# Patient Record
Sex: Female | Born: 1980 | Race: Black or African American | Hispanic: No | Smoking: Former smoker
Health system: Southern US, Community
[De-identification: ages and names within clinical notes are randomized; demographics above are authoritative.]

## PROBLEM LIST (undated history)

## (undated) DIAGNOSIS — M1990 Unspecified osteoarthritis, unspecified site: Secondary | ICD-10-CM

## (undated) DIAGNOSIS — N946 Dysmenorrhea, unspecified: Secondary | ICD-10-CM

## (undated) DIAGNOSIS — D259 Leiomyoma of uterus, unspecified: Secondary | ICD-10-CM

## (undated) DIAGNOSIS — F329 Major depressive disorder, single episode, unspecified: Secondary | ICD-10-CM

## (undated) DIAGNOSIS — R102 Pelvic and perineal pain: Secondary | ICD-10-CM

## (undated) DIAGNOSIS — F32A Depression, unspecified: Secondary | ICD-10-CM

## (undated) DIAGNOSIS — G8929 Other chronic pain: Secondary | ICD-10-CM

## (undated) DIAGNOSIS — N920 Excessive and frequent menstruation with regular cycle: Secondary | ICD-10-CM

## (undated) DIAGNOSIS — F209 Schizophrenia, unspecified: Secondary | ICD-10-CM

## (undated) DIAGNOSIS — I10 Essential (primary) hypertension: Secondary | ICD-10-CM

## (undated) DIAGNOSIS — F419 Anxiety disorder, unspecified: Secondary | ICD-10-CM

## (undated) DIAGNOSIS — D649 Anemia, unspecified: Secondary | ICD-10-CM

## (undated) HISTORY — PX: CHOLECYSTECTOMY: SHX55

## (undated) HISTORY — PX: TONSILLECTOMY: SUR1361

## (undated) HISTORY — PX: TUBAL LIGATION: SHX77

## (undated) HISTORY — PX: ADENOIDECTOMY: SUR15

---

## 2006-02-23 ENCOUNTER — Emergency Department (HOSPITAL_COMMUNITY): Admission: EM | Admit: 2006-02-23 | Discharge: 2006-02-23 | Payer: Self-pay | Admitting: Emergency Medicine

## 2012-06-21 ENCOUNTER — Emergency Department (HOSPITAL_COMMUNITY)
Admission: EM | Admit: 2012-06-21 | Discharge: 2012-06-21 | Disposition: A | Discharge status: Home / Self Care | Payer: Medicaid Other | Attending: Emergency Medicine | Admitting: Emergency Medicine

## 2012-06-21 ENCOUNTER — Encounter (HOSPITAL_COMMUNITY): Payer: Self-pay | Admitting: *Deleted

## 2012-06-21 DIAGNOSIS — F17200 Nicotine dependence, unspecified, uncomplicated: Secondary | ICD-10-CM | POA: Insufficient documentation

## 2012-06-21 DIAGNOSIS — N390 Urinary tract infection, site not specified: Secondary | ICD-10-CM

## 2012-06-21 DIAGNOSIS — R109 Unspecified abdominal pain: Secondary | ICD-10-CM | POA: Insufficient documentation

## 2012-06-21 HISTORY — DX: Major depressive disorder, single episode, unspecified: F32.9

## 2012-06-21 HISTORY — DX: Depression, unspecified: F32.A

## 2012-06-21 LAB — URINALYSIS, ROUTINE W REFLEX MICROSCOPIC
Glucose, UA: NEGATIVE mg/dL
Ketones, ur: NEGATIVE mg/dL
Specific Gravity, Urine: 1.02 (ref 1.005–1.030)
pH: 7 (ref 5.0–8.0)

## 2012-06-21 LAB — URINE MICROSCOPIC-ADD ON

## 2012-06-21 MED ORDER — CIPROFLOXACIN HCL 500 MG PO TABS: 500.0000 mg | ORAL_TABLET | Freq: Two times a day (BID) | ORAL | Status: AC

## 2012-06-21 MED ORDER — PHENAZOPYRIDINE HCL 200 MG PO TABS: 200.0000 mg | ORAL_TABLET | Freq: Three times a day (TID) | ORAL | Status: AC | PRN

## 2012-06-21 MED ORDER — PHENAZOPYRIDINE HCL 100 MG PO TABS
200.0000 mg | ORAL_TABLET | Freq: Once | ORAL | Status: AC
Start: 2012-06-21 — End: 2012-06-21
  Administered 2012-06-21: 200 mg via ORAL
  Filled 2012-06-21: qty 2

## 2012-06-21 NOTE — ED Provider Notes (Cosign Needed)
History  This chart was scribed for Rachel Lennert, MD by Rachel Ray. This patient was seen in room APA16A/[facility]16A and the patient's care was started at 3:32PM.  CSN: [id number]  Arrival date & time 06/21/12  1515   First MD Initiated Contact with Patient 06/21/12 1532      Chief Complaint  Patient presents with  . Urinary Frequency    Patient is a 31 y.o. female presenting with abdominal pain. The history is provided by the patient. No language interpreter was used.  Abdominal Pain The primary symptoms of the illness include abdominal pain and nausea. The primary symptoms of the illness do not include fatigue, vomiting, diarrhea, dysuria or vaginal bleeding. The current episode started 2 days ago. The onset of the illness was gradual. The problem has been gradually worsening.  The abdominal pain is located in the suprapubic region. The abdominal pain radiates to the back. The abdominal pain is relieved by nothing.  Additional symptoms associated with the illness include urgency, hematuria, frequency and back pain. Significant associated medical issues do not include inflammatory bowel disease, diabetes or diverticulitis.    Rachel Ray is a 31 y.o. female who presents to the Emergency Department complaining of 2 days of gradual onset, gradually worsening, constant suprapubic abdominal pain described as pressure that radiates to the lower back with associated nausea, hematuria described tea colored, urgency and frequency. The pain is worse after urination and she reports using Aleve with mild improvement. She reports prior episodes of similar bladder infection, last one occuring over one year ago. She also c/o back pain described as a cramping sensation that radiates down to left foot. She denies fever, sore throat, visual disturbance, CP, cough, emesis, diarrhea, HA, weakness, numbness and rash as associated symptoms. She has a h/o depression. She is a current everyday smoker and  occasional alcohol user.  PCP is Rachel Ray.  Past Medical History  Diagnosis Date  . Depression     Past Surgical History  Procedure Date  . Cesarean section     x 3  . Cholecystectomy   . Tonsillectomy   . Adenoidectomy   . Tubal ligation     History reviewed. No pertinent family history.  History  Substance Use Topics  . Smoking status: Current Everyday Smoker -- 0.5 packs/day    Types: Cigarettes  . Smokeless tobacco: Not on file  . Alcohol Use: Yes    NO OB history provided.  Review of Systems  Constitutional: Negative for fatigue.  HENT: Negative for congestion, sinus pressure and ear discharge.   Eyes: Negative for discharge.  Respiratory: Negative for cough.   Cardiovascular: Negative for chest pain.  Gastrointestinal: Positive for nausea and abdominal pain. Negative for vomiting and diarrhea.  Genitourinary: Positive for urgency, frequency and hematuria. Negative for dysuria and vaginal bleeding.  Musculoskeletal: Positive for back pain.  Skin: Negative for rash.  Neurological: Negative for seizures and headaches.  Hematological: Negative.   Psychiatric/Behavioral: Negative for hallucinations.    Allergies  Codeine and Penicillins  Home Medications  No current outpatient prescriptions on file.  Triage Vitals: BP 117/80  Pulse 89  Temp 98.3 F (36.8 C) (Oral)  Resp 16  Ht 5\' 2"  (1.575 m)  Wt 220 lb (99.791 kg)  BMI 40.24 kg/m2  SpO2 100%  LMP 06/03/2012  Physical Exam  Nursing note and vitals reviewed. Constitutional: She is oriented to person, place, and time. She appears well-developed and well-nourished.  HENT:  Head: Normocephalic and atraumatic.  Eyes: Conjunctivae and EOM are normal. No scleral icterus.  Neck: Neck supple. No thyromegaly present.  Cardiovascular: Normal rate and regular rhythm.  Exam reveals no gallop and no friction rub.   No murmur heard. Pulmonary/Chest: Effort normal and breath sounds normal. No stridor. She  has no wheezes. She has no rales. She exhibits no tenderness.  Abdominal: Soft. She exhibits no distension. There is tenderness (mild suprapubic tenderness). There is no rebound.  Musculoskeletal: Normal range of motion. She exhibits no edema.       Mild lumbar tenderness  Lymphadenopathy:    She has no cervical adenopathy.  Neurological: She is alert and oriented to person, place, and time. Coordination normal.  Skin: No rash noted. No erythema.  Psychiatric: She has a normal mood and affect. Her behavior is normal.    ED Course  Procedures (including critical care time)  DIAGNOSTIC STUDIES: Oxygen Saturation is 100% on room air, normal by my interpretation.    COORDINATION OF CARE: 3:30PM-Discussed treatment plan which includes urinalysis with pt at bedside and pt agreed to plan.    Labs Reviewed  URINALYSIS, ROUTINE W REFLEX MICROSCOPIC   No results found.   No diagnosis found.    MDM        The chart was scribed for me under my direct supervision.  I personally performed the history, physical, and medical decision making and all procedures in the evaluation of this patient.Rachel Lennert, MD 06/21/12 [phone]

## 2012-06-21 NOTE — ED Notes (Signed)
Pt c/o urinary frequency, urgency, lower back pain radiating to her left leg and blood in her urine for 2 days. Pt states that it is worse today. C/o nausea also.

## 2012-07-28 ENCOUNTER — Encounter (HOSPITAL_COMMUNITY): Payer: Self-pay | Admitting: *Deleted

## 2012-07-28 ENCOUNTER — Emergency Department (HOSPITAL_COMMUNITY)
Admission: EM | Admit: 2012-07-28 | Discharge: 2012-07-28 | Disposition: A | Discharge status: Home / Self Care | Payer: Medicaid Other | Attending: Emergency Medicine | Admitting: Emergency Medicine

## 2012-07-28 DIAGNOSIS — F419 Anxiety disorder, unspecified: Secondary | ICD-10-CM

## 2012-07-28 DIAGNOSIS — Z760 Encounter for issue of repeat prescription: Secondary | ICD-10-CM | POA: Insufficient documentation

## 2012-07-28 DIAGNOSIS — F17200 Nicotine dependence, unspecified, uncomplicated: Secondary | ICD-10-CM | POA: Insufficient documentation

## 2012-07-28 DIAGNOSIS — I10 Essential (primary) hypertension: Secondary | ICD-10-CM | POA: Insufficient documentation

## 2012-07-28 DIAGNOSIS — F411 Generalized anxiety disorder: Secondary | ICD-10-CM | POA: Insufficient documentation

## 2012-07-28 DIAGNOSIS — F329 Major depressive disorder, single episode, unspecified: Secondary | ICD-10-CM | POA: Insufficient documentation

## 2012-07-28 DIAGNOSIS — F3289 Other specified depressive episodes: Secondary | ICD-10-CM | POA: Insufficient documentation

## 2012-07-28 HISTORY — DX: Essential (primary) hypertension: I10

## 2012-07-28 HISTORY — DX: Anxiety disorder, unspecified: F41.9

## 2012-07-28 MED ORDER — ALPRAZOLAM 0.5 MG PO TABS
0.5000 mg | ORAL_TABLET | Freq: Once | ORAL | Status: AC
  Administered 2012-07-28: 0.5 mg via ORAL
  Filled 2012-07-28: qty 1

## 2012-07-28 MED ORDER — QUETIAPINE FUMARATE 400 MG PO TABS: 400.0000 mg | ORAL_TABLET | Freq: Every day | ORAL | Status: DC

## 2012-07-28 MED ORDER — TRAZODONE HCL 100 MG PO TABS: 100.0000 mg | ORAL_TABLET | Freq: Every day | ORAL | Status: DC

## 2012-07-28 MED ORDER — ALPRAZOLAM 0.5 MG PO TABS: 0.5000 mg | ORAL_TABLET | Freq: Two times a day (BID) | ORAL | Status: AC

## 2012-07-28 NOTE — ED Notes (Signed)
Pt is calm at this time 

## 2012-07-28 NOTE — ED Notes (Signed)
Hx of panic attacks - missed appt yesterday and has been out of anxiety meds x 1 wk.  Reports increased anxiety since.  States was lying across bed this morning and felt heart beating fast.  Pt calm at this time.

## 2012-07-28 NOTE — ED Notes (Signed)
Pt states she had a panic attack this morning after "getting into it with my daughter". Pt states her daughter did not want to go to school. States she missed her MD appt yesterday and has been out of all of her medications x 1 week.

## 2012-07-28 NOTE — ED Provider Notes (Signed)
History     CSN: [id number]  Arrival date & time 07/28/12  4098   First MD Initiated Contact with Patient 07/28/12 [phone]      Chief Complaint  Patient presents with  . Panic Attack    (Consider location/radiation/quality/duration/timing/severity/associated sxs/prior treatment) HPI Comments: Rachel Ray presents for assistance with medication refill.  She ran out of several of her psychiatric medications within the last 4-5 days.  She missed her appointment with Dr. Betti Cruz at day [facility] [place] today and this was having to be rescheduled.  In the interim she has noticed an escalation in her anxiety and reports having a panic attack while at home last evening which included shortness of breath palpitations and peripheral numbness in her fingers and toes which lasted for several minutes.  She is without symptoms at this time but is desirous of refills on her Xanax, and her Seroquel and her trazodone.  She reports she has her Cymbalta and has been taking this daily.  She denies any other symptoms at this time including suicidal or homicidal ideation.  She presents with a family member today and feels comfortable going home and is without other complaints.  She is awaiting a callback from Dr. Jae Dire office for an appointment time for evaluation and refills.   Past Medical History  Diagnosis Date  . Depression   . Anxiety   . Hypertension     Past Surgical History  Procedure Date  . Cesarean section     x 3  . Cholecystectomy   . Tonsillectomy   . Adenoidectomy   . Tubal ligation     No family history on file.  History  Substance Use Topics  . Smoking status: Current Everyday Smoker -- 0.5 packs/day    Types: Cigarettes  . Smokeless tobacco: Not on file  . Alcohol Use: Yes     occasional    OB History    Grav Para Term Preterm Abortions TAB SAB Ect Mult Living                  Review of Systems  Constitutional: Negative for fever.  HENT: Negative for congestion, sore throat and  neck pain.   Eyes: Negative.   Respiratory: Negative for chest tightness and shortness of breath.   Cardiovascular: Positive for palpitations. Negative for chest pain and leg swelling.  Gastrointestinal: Negative for nausea and abdominal pain.  Genitourinary: Negative.   Musculoskeletal: Negative for joint swelling and arthralgias.  Skin: Negative.  Negative for rash and wound.  Neurological: Negative for dizziness, weakness, light-headedness, numbness and headaches.  Hematological: Negative.   Psychiatric/Behavioral: Positive for disturbed wake/sleep cycle. Negative for suicidal ideas and self-injury. The patient is nervous/anxious.     Allergies  Codeine; Penicillins; and Shellfish allergy  Home Medications   Current Outpatient Rx  Name Route Sig Dispense Refill  . ALPRAZOLAM 0.5 MG PO TABS Oral Take 0.5 mg by mouth 2 (two) times daily.    Marland Kitchen CLONIDINE HCL 0.1 MG PO TABS Oral Take 0.1 mg by mouth 2 (two) times daily.    Marland Kitchen DIPHENHYDRAMINE HCL 25 MG PO CAPS Oral Take 25 mg by mouth every 6 (six) hours as needed. Itchy eyes.    . DULOXETINE HCL 60 MG PO CPEP Oral Take 60 mg by mouth daily.    . QUETIAPINE FUMARATE 400 MG PO TABS Oral Take 400 mg by mouth at bedtime.    . TRAZODONE HCL 100 MG PO TABS Oral Take 100 mg by mouth  at bedtime.    . ALPRAZOLAM 0.5 MG PO TABS Oral Take 1 tablet (0.5 mg total) by mouth 2 (two) times daily. 20 tablet 0  . QUETIAPINE FUMARATE 400 MG PO TABS Oral Take 1 tablet (400 mg total) by mouth at bedtime. 10 tablet 0  . TRAZODONE HCL 100 MG PO TABS Oral Take 1 tablet (100 mg total) by mouth at bedtime. 10 tablet 0    BP 104/69  Pulse 48  Temp 98.1 F (36.7 C) (Oral)  Resp 16  Ht 5\' 2"  (1.575 m)  Wt 230 lb (104.327 kg)  BMI 42.07 kg/m2  SpO2 96%  LMP 07/21/2012  Physical Exam  Nursing note and vitals reviewed. Constitutional: She appears well-developed and well-nourished.  HENT:  Head: Normocephalic and atraumatic.  Neck: Neck supple.    Cardiovascular: Normal rate, regular rhythm, normal heart sounds and intact distal pulses.   No murmur heard. Pulmonary/Chest: Effort normal and breath sounds normal. She has no wheezes. She has no rales.  Abdominal: Bowel sounds are normal.  Musculoskeletal: Normal range of motion.  Neurological: She is alert.  Skin: Skin is warm and dry.  Psychiatric: She has a normal mood and affect. Her behavior is normal. Judgment and thought content normal.    ED Course  Procedures (including critical care time)  Labs Reviewed - No data to display No results found.   1. Medication refill   2. Anxiety       MDM  Patient was given Xanax 0.5 mg prior to discharge home.  She was given refills of her xanax, her trazodone and her Seroquel and her trazodone to last for 10 days.  She was strongly advised to follow up with Dr. Betti Cruz prior to completing this refill as he will need to refill further medications.        Burgess Amor, PA 07/28/12 1017

## 2012-07-29 NOTE — ED Provider Notes (Signed)
Medical screening examination/treatment/procedure(s) were performed by non-physician practitioner and as supervising physician I was immediately available for consultation/collaboration.   Carleene [place] III, MD 07/29/12 [phone]

## 2012-08-02 ENCOUNTER — Encounter (HOSPITAL_COMMUNITY): Payer: Self-pay | Admitting: *Deleted

## 2012-08-02 ENCOUNTER — Emergency Department (HOSPITAL_COMMUNITY): Payer: Medicaid Other

## 2012-08-02 ENCOUNTER — Emergency Department (HOSPITAL_COMMUNITY)
Admission: EM | Admit: 2012-08-02 | Discharge: 2012-08-02 | Disposition: A | Discharge status: Home / Self Care | Payer: Medicaid Other | Attending: Emergency Medicine | Admitting: Emergency Medicine

## 2012-08-02 DIAGNOSIS — R059 Cough, unspecified: Secondary | ICD-10-CM | POA: Insufficient documentation

## 2012-08-02 DIAGNOSIS — Z9089 Acquired absence of other organs: Secondary | ICD-10-CM | POA: Insufficient documentation

## 2012-08-02 DIAGNOSIS — J209 Acute bronchitis, unspecified: Secondary | ICD-10-CM | POA: Insufficient documentation

## 2012-08-02 DIAGNOSIS — F17200 Nicotine dependence, unspecified, uncomplicated: Secondary | ICD-10-CM | POA: Insufficient documentation

## 2012-08-02 DIAGNOSIS — I10 Essential (primary) hypertension: Secondary | ICD-10-CM | POA: Insufficient documentation

## 2012-08-02 DIAGNOSIS — F341 Dysthymic disorder: Secondary | ICD-10-CM | POA: Insufficient documentation

## 2012-08-02 DIAGNOSIS — Z79899 Other long term (current) drug therapy: Secondary | ICD-10-CM | POA: Insufficient documentation

## 2012-08-02 DIAGNOSIS — R05 Cough: Secondary | ICD-10-CM | POA: Insufficient documentation

## 2012-08-02 MED ORDER — ALBUTEROL SULFATE HFA 108 (90 BASE) MCG/ACT IN AERS
2.0000 | INHALATION_SPRAY | RESPIRATORY_TRACT | Status: DC | PRN
  Filled 2012-08-02: qty 6.7

## 2012-08-02 MED ORDER — GUAIFENESIN 100 MG/5ML PO LIQD: 100.0000 mg | ORAL | Status: AC | PRN

## 2012-08-02 MED ORDER — HYDROCODONE-ACETAMINOPHEN 5-325 MG PO TABS
2.0000 | ORAL_TABLET | Freq: Once | ORAL | Status: AC
  Administered 2012-08-02: 2 via ORAL
  Filled 2012-08-02: qty 2

## 2012-08-02 MED ORDER — AZITHROMYCIN 250 MG PO TABS: 250.0000 mg | ORAL_TABLET | Freq: Every day | ORAL | Status: AC

## 2012-08-02 NOTE — ED Provider Notes (Signed)
History     CSN: [id number]  Arrival date & time 08/02/12  1818   First MD Initiated Contact with Patient 08/02/12 1841      Chief Complaint  Patient presents with  . Sore Throat  . Cough    (Consider location/radiation/quality/duration/timing/severity/associated sxs/prior treatment) HPI Comments: Started several days ago with chest congestion, cough, worse today.  Coughing up phlegm.  She is a smoker.    Patient is a 31 y.o. female presenting with URI. The history is provided by the patient.  URI The primary symptoms include fever, fatigue and cough. The current episode started 3 to 5 days ago. This is a new problem. The problem has been gradually worsening.  Symptoms associated with the illness include chills and congestion. The illness is not associated with sinus pressure.    Past Medical History  Diagnosis Date  . Depression   . Anxiety   . Hypertension     Past Surgical History  Procedure Date  . Cesarean section     x 3  . Cholecystectomy   . Tonsillectomy   . Adenoidectomy   . Tubal ligation     No family history on file.  History  Substance Use Topics  . Smoking status: Current Everyday Smoker -- 0.5 packs/day    Types: Cigarettes  . Smokeless tobacco: Not on file  . Alcohol Use: Yes     occasional    OB History    Grav Para Term Preterm Abortions TAB SAB Ect Mult Living                  Review of Systems  Constitutional: Positive for fever, chills and fatigue.  HENT: Positive for congestion. Negative for sinus pressure.   Respiratory: Positive for cough.   All other systems reviewed and are negative.    Allergies  Codeine; Penicillins; and Shellfish allergy  Home Medications   Current Outpatient Rx  Name Route Sig Dispense Refill  . ALPRAZOLAM 0.5 MG PO TABS Oral Take 0.5 mg by mouth 2 (two) times daily.    Marland Kitchen ALPRAZOLAM 0.5 MG PO TABS Oral Take 1 tablet (0.5 mg total) by mouth 2 (two) times daily. 20 tablet 0  . CLONIDINE HCL 0.1 MG  PO TABS Oral Take 0.1 mg by mouth 2 (two) times daily.    Marland Kitchen DIPHENHYDRAMINE HCL 25 MG PO CAPS Oral Take 25 mg by mouth every 6 (six) hours as needed. Itchy eyes.    . DULOXETINE HCL 60 MG PO CPEP Oral Take 60 mg by mouth daily.    . QUETIAPINE FUMARATE 400 MG PO TABS Oral Take 400 mg by mouth at bedtime.    Marland Kitchen QUETIAPINE FUMARATE 400 MG PO TABS Oral Take 1 tablet (400 mg total) by mouth at bedtime. 10 tablet 0  . TRAZODONE HCL 100 MG PO TABS Oral Take 100 mg by mouth at bedtime.    . TRAZODONE HCL 100 MG PO TABS Oral Take 1 tablet (100 mg total) by mouth at bedtime. 10 tablet 0    BP 152/98  Pulse 99  Temp 99.1 F (37.3 C) (Oral)  Resp 20  Ht 5\' 2"  (1.575 m)  Wt 230 lb (104.327 kg)  BMI 42.07 kg/m2  SpO2 100%  LMP 07/21/2012  Physical Exam  Nursing note and vitals reviewed. Constitutional: She is oriented to person, place, and time. She appears well-developed and well-nourished. No distress.  HENT:  Head: Normocephalic and atraumatic.  Neck: Normal range of motion. Neck supple.  Cardiovascular: Normal rate and regular rhythm.  Exam reveals no gallop and no friction rub.   No murmur heard. Pulmonary/Chest: Effort normal.       Rhonchorous breath sounds, the left worse than the right.  Abdominal: Soft. Bowel sounds are normal. She exhibits no distension. There is no tenderness.  Musculoskeletal: Normal range of motion.  Neurological: She is alert and oriented to person, place, and time.  Skin: Skin is warm and dry. She is not diaphoretic.    ED Course  Procedures (including critical care time)  Labs Reviewed - No data to display No results found.   No diagnosis found.    MDM  The chest xray shows some hilar prominence but no definite infiltrate or pneumonia and her oxygen sats are 100%.  She reports coughing up phlegm and is a smoker.  She will be prescribed an inhaler and zithromax, to return prn if worsens.  She was advised of the risks of smoking of which she is well  aware.        Geoffery Lyons, MD 08/02/12 [phone]

## 2012-08-02 NOTE — ED Notes (Signed)
Pt c/o sore throat that started last night, cough and dizziness that started today, denies any n/v, cough is productive with thick sputum, unsure of any color. Chills

## 2012-10-30 ENCOUNTER — Emergency Department (HOSPITAL_COMMUNITY): Payer: Medicare Other

## 2012-10-30 ENCOUNTER — Emergency Department (HOSPITAL_COMMUNITY)
Admission: EM | Admit: 2012-10-30 | Discharge: 2012-10-30 | Disposition: A | Discharge status: Home / Self Care | Payer: Medicare Other | Attending: Emergency Medicine | Admitting: Emergency Medicine

## 2012-10-30 ENCOUNTER — Encounter (HOSPITAL_COMMUNITY): Payer: Self-pay | Admitting: *Deleted

## 2012-10-30 DIAGNOSIS — Z79899 Other long term (current) drug therapy: Secondary | ICD-10-CM | POA: Insufficient documentation

## 2012-10-30 DIAGNOSIS — I10 Essential (primary) hypertension: Secondary | ICD-10-CM | POA: Insufficient documentation

## 2012-10-30 DIAGNOSIS — M069 Rheumatoid arthritis, unspecified: Secondary | ICD-10-CM | POA: Insufficient documentation

## 2012-10-30 DIAGNOSIS — F411 Generalized anxiety disorder: Secondary | ICD-10-CM | POA: Insufficient documentation

## 2012-10-30 DIAGNOSIS — F17200 Nicotine dependence, unspecified, uncomplicated: Secondary | ICD-10-CM | POA: Insufficient documentation

## 2012-10-30 DIAGNOSIS — F329 Major depressive disorder, single episode, unspecified: Secondary | ICD-10-CM | POA: Insufficient documentation

## 2012-10-30 DIAGNOSIS — F3289 Other specified depressive episodes: Secondary | ICD-10-CM | POA: Insufficient documentation

## 2012-10-30 DIAGNOSIS — M25561 Pain in right knee: Secondary | ICD-10-CM

## 2012-10-30 DIAGNOSIS — M25579 Pain in unspecified ankle and joints of unspecified foot: Secondary | ICD-10-CM | POA: Insufficient documentation

## 2012-10-30 MED ORDER — HYDROCODONE-ACETAMINOPHEN 5-325 MG PO TABS: 1.0000 | ORAL_TABLET | Freq: Four times a day (QID) | ORAL | Status: AC | PRN

## 2012-10-30 MED ORDER — HYDROCODONE-ACETAMINOPHEN 5-325 MG PO TABS
1.0000 | ORAL_TABLET | Freq: Once | ORAL | Status: AC
  Administered 2012-10-30: 1 via ORAL
  Filled 2012-10-30: qty 1

## 2012-10-30 NOTE — ED Notes (Signed)
Reports right knee pain that started last night.  States woke up this morning with pain radiating from right knee to right hip.  Denies injury.

## 2012-10-30 NOTE — ED Provider Notes (Signed)
History     CSN: [id number]  Arrival date & time 10/30/12  1454   First MD Initiated Contact with Patient 10/30/12 1522      Chief Complaint  Patient presents with  . Leg Pain    (Consider location/radiation/quality/duration/timing/severity/associated sxs/prior treatment) HPI Comments: R knee pain since yest.  No known injury.  States pain radiates to the R hip.    No other complaints.  The history is provided by the patient. No language interpreter was used.    Past Medical History  Diagnosis Date  . Depression   . Anxiety   . Hypertension   . Rheumatoid arteritis     Past Surgical History  Procedure Date  . Cesarean section     x 3  . Cholecystectomy   . Tonsillectomy   . Adenoidectomy   . Tubal ligation     No family history on file.  History  Substance Use Topics  . Smoking status: Current Every Day Smoker -- 0.5 packs/day    Types: Cigarettes  . Smokeless tobacco: Not on file  . Alcohol Use: Yes     Comment: occasional    OB History    Grav Para Term Preterm Abortions TAB SAB Ect Mult Living                  Review of Systems  Constitutional: Negative for fever and chills.  Musculoskeletal:       Knee pain   Neurological: Negative for weakness and numbness.  All other systems reviewed and are negative.    Allergies  Shellfish allergy; Codeine; and Penicillins  Home Medications   Current Outpatient Rx  Name  Route  Sig  Dispense  Refill  . CLONIDINE HCL 0.1 MG PO TABS   Oral   Take 0.1 mg by mouth 2 (two) times daily.         . DULOXETINE HCL 60 MG PO CPEP   Oral   Take 60 mg by mouth daily.         Marland Kitchen HYDROCODONE-ACETAMINOPHEN 5-325 MG PO TABS   Oral   Take 1 tablet by mouth every 6 (six) hours as needed for pain.   20 tablet   0   . QUETIAPINE FUMARATE 400 MG PO TABS   Oral   Take 1 tablet (400 mg total) by mouth at bedtime.   10 tablet   0   . TRAZODONE HCL 100 MG PO TABS   Oral   Take 1 tablet (100 mg total) by  mouth at bedtime.   10 tablet   0     BP 114/74  Pulse 71  Temp 98.1 F (36.7 C) (Oral)  Resp 20  Ht 5\' 2"  (1.575 m)  Wt 220 lb (99.791 kg)  BMI 40.24 kg/m2  SpO2 100%  LMP 10/23/2012  Physical Exam  Nursing note and vitals reviewed. Constitutional: She is oriented to person, place, and time. She appears well-developed and well-nourished. No distress.  HENT:  Head: Normocephalic and atraumatic.  Eyes: EOM are normal.  Neck: Normal range of motion.  Cardiovascular: Normal rate and regular rhythm.   Pulmonary/Chest: Effort normal.  Abdominal: Soft. She exhibits no distension. There is no tenderness.  Musculoskeletal: Normal range of motion.       Legs: Neurological: She is alert and oriented to person, place, and time.  Skin: Skin is warm and dry.  Psychiatric: She has a normal mood and affect. Judgment normal.    ED Course  Procedures (  including critical care time)  Labs Reviewed - No data to display Dg Hip Complete Right  10/30/2012  *RADIOLOGY REPORT*  Clinical Data: Right hip pain  RIGHT HIP - COMPLETE 2+ VIEW  Comparison: None.  Findings: Three views of the right hip submitted.  No acute fracture or subluxation.  Post tubal ligation surgical clips are noted.  IMPRESSION: No acute fracture or subluxation.   Original Report Authenticated By: Natasha Mead, M.D.    Dg Knee Complete 4 Views Right  10/30/2012  *RADIOLOGY REPORT*  Clinical Data: Right hip pain, right knee pain  RIGHT KNEE - COMPLETE 4+ VIEW  Comparison: None.  Findings: Four views of the right knee submitted.  No acute fracture or subluxation.  Small joint effusion.  IMPRESSION: No acute fracture or subluxation.  Small joint effusion.   Original Report Authenticated By: Natasha Mead, M.D.      1. Right knee pain       MDM  Knee immobilizer, crutches Ibuprofen  rx-hydrocodone, 20 F/u with dr. Hilda Lias.        Evalina Field, [place] 10/30/12 [phone]

## 2012-10-30 NOTE — ED Notes (Signed)
Pt ambulatory without difficulty. 

## 2012-10-30 NOTE — ED Notes (Signed)
Patient with no complaints at this time. Respirations even and unlabored. Skin warm/dry. Discharge instructions reviewed with patient at this time. Patient given opportunity to voice concerns/ask questions. Patient discharged at this time and left Emergency Department with steady gait.   

## 2012-10-31 LAB — GLUCOSE, CAPILLARY: Glucose-Capillary: 77 mg/dL (ref 70–99)

## 2012-11-02 NOTE — ED Provider Notes (Signed)
Medical screening examination/treatment/procedure(s) were performed by non-physician practitioner and as supervising physician I was immediately available for consultation/collaboration.  Raeford Razor, MD 11/02/12 [phone]

## 2012-12-20 ENCOUNTER — Emergency Department (HOSPITAL_COMMUNITY)
Admission: EM | Admit: 2012-12-20 | Discharge: 2012-12-20 | Disposition: A | Discharge status: Home / Self Care | Payer: Medicare Other | Attending: Emergency Medicine | Admitting: Emergency Medicine

## 2012-12-20 ENCOUNTER — Encounter (HOSPITAL_COMMUNITY): Payer: Self-pay | Admitting: *Deleted

## 2012-12-20 DIAGNOSIS — Z8739 Personal history of other diseases of the musculoskeletal system and connective tissue: Secondary | ICD-10-CM | POA: Insufficient documentation

## 2012-12-20 DIAGNOSIS — Z79899 Other long term (current) drug therapy: Secondary | ICD-10-CM | POA: Insufficient documentation

## 2012-12-20 DIAGNOSIS — M25569 Pain in unspecified knee: Secondary | ICD-10-CM | POA: Insufficient documentation

## 2012-12-20 DIAGNOSIS — F411 Generalized anxiety disorder: Secondary | ICD-10-CM | POA: Insufficient documentation

## 2012-12-20 DIAGNOSIS — F17200 Nicotine dependence, unspecified, uncomplicated: Secondary | ICD-10-CM | POA: Insufficient documentation

## 2012-12-20 DIAGNOSIS — G8929 Other chronic pain: Secondary | ICD-10-CM | POA: Insufficient documentation

## 2012-12-20 DIAGNOSIS — Z87828 Personal history of other (healed) physical injury and trauma: Secondary | ICD-10-CM | POA: Insufficient documentation

## 2012-12-20 DIAGNOSIS — F329 Major depressive disorder, single episode, unspecified: Secondary | ICD-10-CM | POA: Insufficient documentation

## 2012-12-20 DIAGNOSIS — F3289 Other specified depressive episodes: Secondary | ICD-10-CM | POA: Insufficient documentation

## 2012-12-20 DIAGNOSIS — I10 Essential (primary) hypertension: Secondary | ICD-10-CM | POA: Insufficient documentation

## 2012-12-20 MED ORDER — IBUPROFEN 600 MG PO TABS: 600.0000 mg | ORAL_TABLET | Freq: Four times a day (QID) | ORAL | Status: AC | PRN

## 2012-12-20 MED ORDER — HYDROCODONE-ACETAMINOPHEN 5-325 MG PO TABS: 1.0000 | ORAL_TABLET | ORAL | Status: AC | PRN

## 2012-12-20 NOTE — ED Notes (Signed)
Pain rt knee since last pm,  No recent falls.  Feels like it "pops out of place"

## 2012-12-20 NOTE — ED Provider Notes (Signed)
Medical screening examination/treatment/procedure(s) were performed by non-physician practitioner and as supervising physician I was immediately available for consultation/collaboration.   Carleene [place] III, MD 12/20/12 [phone]

## 2012-12-20 NOTE — ED Notes (Signed)
Pt reports right knee pain since last night, denies any known injury.  Was seen for same in dec of 2013

## 2012-12-20 NOTE — ED Provider Notes (Signed)
History     CSN: [id number]  Arrival date & time 12/20/12  1331   First MD Initiated Contact with Patient 12/20/12 1410      Chief Complaint  Patient presents with  . Knee Pain    (Consider location/radiation/quality/duration/timing/severity/associated sxs/prior treatment) HPI Comments: Rachel Ray is a 32 y.o. Female presenting with acute on chronic right knee pain which she has had intermittently for the past year since she now landing on that knee.  She was seen here last month with similar symptoms at which time an x-ray of this he showed a mild confusion but no other obvious injury.  She denies any new injury since this time but has had increased pain since yesterday.  She describes very frequent popping in the knee especially with going up and down steps.  She denies any falls but often feels that the knee is unsteady and weak.  She has taken ibuprofen with no relief of her symptoms.  She has not seen an orthopedist since her injury.  Pain is not radiating, is throbbing and nearly constant.     The history is provided by the patient.    Past Medical History  Diagnosis Date  . Depression   . Anxiety   . Hypertension   . Rheumatoid arteritis     Past Surgical History  Procedure Date  . Cesarean section     x 3  . Cholecystectomy   . Tonsillectomy   . Adenoidectomy   . Tubal ligation     History reviewed. No pertinent family history.  History  Substance Use Topics  . Smoking status: Current Every Day Smoker -- 0.5 packs/day    Types: Cigarettes  . Smokeless tobacco: Not on file  . Alcohol Use: Yes     Comment: occasional    OB History    Grav Para Term Preterm Abortions TAB SAB Ect Mult Living                  Review of Systems  Musculoskeletal: Positive for arthralgias. Negative for joint swelling.  Skin: Negative for wound.  Neurological: Negative for weakness and numbness.    Allergies  Shellfish allergy; Codeine; and Penicillins  Home  Medications   Current Outpatient Rx  Name  Route  Sig  Dispense  Refill  . CLONIDINE HCL 0.1 MG PO TABS   Oral   Take 0.1 mg by mouth 2 (two) times daily.         . TRAZODONE HCL 100 MG PO TABS   Oral   Take 100 mg by mouth at bedtime.         Marland Kitchen HYDROCODONE-ACETAMINOPHEN 5-325 MG PO TABS   Oral   Take 1 tablet by mouth every 4 (four) hours as needed for pain.   20 tablet   0   . IBUPROFEN 600 MG PO TABS   Oral   Take 1 tablet (600 mg total) by mouth every 6 (six) hours as needed for pain.   20 tablet   0     BP 109/74  Pulse 91  Temp 98.8 F (37.1 C) (Oral)  Resp 18  Ht 5\' 3"  (1.6 m)  Wt 235 lb (106.595 kg)  BMI 41.63 kg/m2  SpO2 100%  LMP 11/23/2012  Physical Exam  Nursing note and vitals reviewed. Constitutional: She appears well-developed and well-nourished.  HENT:  Head: Normocephalic.  Eyes: Conjunctivae normal are normal.  Neck: Normal range of motion. Neck supple.  Cardiovascular: Normal rate and  intact distal pulses.        Pedal pulses normal.  Pulmonary/Chest: Effort normal.  Abdominal: Soft. Bowel sounds are normal. She exhibits no distension and no mass.  Musculoskeletal: Normal range of motion. She exhibits no edema.       Right knee: She exhibits bony tenderness. She exhibits no effusion, no erythema, normal alignment, no LCL laxity and no MCL laxity. tenderness found. Lateral joint line tenderness noted.       Lumbar back: She exhibits tenderness. She exhibits no swelling, no edema and no spasm.       No crepitus appreciated with range of motion today.  She does have increased pain with attempts to completely extend the knee joint.  Neurological: She is alert. She has normal strength. She displays no atrophy and no tremor. No sensory deficit. Gait normal.  Reflex Scores:      Patellar reflexes are 2+ on the right side and 2+ on the left side.      Achilles reflexes are 2+ on the right side and 2+ on the left side.      No strength deficit  noted in hip and knee flexor and extensor muscle groups.  Ankle flexion and extension intact.  Skin: Skin is warm and dry.  Psychiatric: She has a normal mood and affect.    ED Course  Procedures (including critical care time)  Labs Reviewed - No data to display No results found.   1. Chronic knee pain       MDM  X-rays obtained at her last visit in December eighth were reviewed and significant for a small effusion.  Patient was encouraged to continue use of her crutches and her knee immobilizer, also prescribed hydrocodone and ibuprofen 600 mg.  Heat therapy recommended.  Referral to Dr. Romeo Apple for further management.  Suspect meniscal tear or other soft tissue injury as source of patient's chronic pain.        Burgess Amor, [place] 12/20/12 1453

## 2013-01-30 ENCOUNTER — Encounter (HOSPITAL_COMMUNITY): Payer: Self-pay | Admitting: *Deleted

## 2013-01-30 ENCOUNTER — Emergency Department (HOSPITAL_COMMUNITY)
Admission: EM | Admit: 2013-01-30 | Discharge: 2013-01-30 | Disposition: A | Discharge status: Home / Self Care | Payer: Medicaid Other | Attending: Emergency Medicine | Admitting: Emergency Medicine

## 2013-01-30 DIAGNOSIS — I10 Essential (primary) hypertension: Secondary | ICD-10-CM | POA: Insufficient documentation

## 2013-01-30 DIAGNOSIS — F17200 Nicotine dependence, unspecified, uncomplicated: Secondary | ICD-10-CM | POA: Insufficient documentation

## 2013-01-30 DIAGNOSIS — Z8739 Personal history of other diseases of the musculoskeletal system and connective tissue: Secondary | ICD-10-CM | POA: Insufficient documentation

## 2013-01-30 DIAGNOSIS — Z79899 Other long term (current) drug therapy: Secondary | ICD-10-CM | POA: Insufficient documentation

## 2013-01-30 DIAGNOSIS — G479 Sleep disorder, unspecified: Secondary | ICD-10-CM | POA: Insufficient documentation

## 2013-01-30 DIAGNOSIS — F3289 Other specified depressive episodes: Secondary | ICD-10-CM | POA: Insufficient documentation

## 2013-01-30 DIAGNOSIS — F329 Major depressive disorder, single episode, unspecified: Secondary | ICD-10-CM

## 2013-01-30 DIAGNOSIS — Z760 Encounter for issue of repeat prescription: Secondary | ICD-10-CM | POA: Insufficient documentation

## 2013-01-30 DIAGNOSIS — F411 Generalized anxiety disorder: Secondary | ICD-10-CM | POA: Insufficient documentation

## 2013-01-30 MED ORDER — ALPRAZOLAM 0.5 MG PO TABS: 0.5000 mg | ORAL_TABLET | Freq: Two times a day (BID) | ORAL | Status: DC

## 2013-01-30 MED ORDER — TRAZODONE HCL 100 MG PO TABS: 100.0000 mg | ORAL_TABLET | Freq: Two times a day (BID) | ORAL | Status: DC

## 2013-01-30 NOTE — ED Notes (Signed)
Depressed for 1 week, out of meds for 2 weeks.  Alert, Nad.

## 2013-01-30 NOTE — ED Notes (Signed)
Ran out of Trazadone and Alprazolam, stopped taking Cymbalta and Seroquel because she did not like the way it made her feel - jitters and legs twitching.  Called [facility] but since she missed last two appts due to bad weather, they told her to come here if she felt was emergency.  No MD available at [facility] to see her at present.

## 2013-01-30 NOTE — ED Provider Notes (Signed)
History     This chart was scribed for Charles B. Bernette Mayers, MD, MD by Smitty Pluck, ED Scribe. The patient was seen in room APA15/APA15 and the patient's care was started at 7:38 PM.   CSN: [id number]  Arrival date & time 01/30/13  1815      No chief complaint on file.    The history is provided by the patient. No language interpreter was used.   Rachel Ray is a 32 y.o. female who presents to the Emergency Department complaining of constant, depression onset 1 week ago. She states she has been unable to sleep for past 3 days. Pt reports that she ran out of her medication 2 weeks ago due to missing her appointment due to weather. She states she normally takes trazodone 100 mg 2x daily and xanax 2x daily. Pt denies SI and HI. She denies auditory and visual hallucinations. Pt reports that she feels safe at home.   Past Medical History  Diagnosis Date  . Depression   . Anxiety   . Hypertension   . Rheumatoid arteritis     Past Surgical History  Procedure Laterality Date  . Cesarean section      x 3  . Cholecystectomy    . Tonsillectomy    . Adenoidectomy    . Tubal ligation      History reviewed. No pertinent family history.  History  Substance Use Topics  . Smoking status: Current Every Day Smoker -- 0.50 packs/day    Types: Cigarettes  . Smokeless tobacco: Not on file  . Alcohol Use: Yes     Comment: occasional    OB History   Grav Para Term Preterm Abortions TAB SAB Ect Mult Living                  Review of Systems 10 Systems reviewed and all are negative for acute change except as noted in the HPI.   Allergies  Shellfish allergy; Codeine; and Penicillins  Home Medications   Current Outpatient Rx  Name  Route  Sig  Dispense  Refill  . ALPRAZolam (XANAX) 0.5 MG tablet   Oral   Take 0.5 mg by mouth 2 (two) times daily.         . cloNIDine (CATAPRES) 0.1 MG tablet   Oral   Take 0.1 mg by mouth 2 (two) times daily.         .  diphenhydramine-acetaminophen (TYLENOL PM) 25-500 MG TABS   Oral   Take 2 tablets by mouth at bedtime as needed (sleep).         . traZODone (DESYREL) 100 MG tablet   Oral   Take 100 mg by mouth at bedtime.           BP 125/78  Pulse 67  Temp(Src) 98.3 F (36.8 C) (Oral)  Resp 18  Ht 5\' 3"  (1.6 m)  Wt 230 lb (104.327 kg)  BMI 40.75 kg/m2  SpO2 100%  LMP 01/23/2013  Physical Exam  Nursing note and vitals reviewed. Constitutional: She is oriented to person, place, and time. She appears well-developed and well-nourished.  HENT:  Head: Normocephalic and atraumatic.  Eyes: EOM are normal. Pupils are equal, round, and reactive to light.  Neck: Normal range of motion. Neck supple.  Cardiovascular: Normal rate, normal heart sounds and intact distal pulses.   Pulmonary/Chest: Effort normal and breath sounds normal.  Abdominal: Bowel sounds are normal. She exhibits no distension. There is no tenderness.  Musculoskeletal: Normal range  of motion. She exhibits no edema and no tenderness.  Neurological: She is alert and oriented to person, place, and time. She has normal strength. No cranial nerve deficit or sensory deficit.  Skin: Skin is warm and dry. No rash noted.  Psychiatric: She has a normal mood and affect.    ED Course  Procedures (including critical care time) DIAGNOSTIC STUDIES: Oxygen Saturation is 100% on room air, normal by my interpretation.    COORDINATION OF CARE: 7:40 PM Discussed ED treatment with pt and pt agrees.     Labs Reviewed - No data to display No results found.   No diagnosis found.    MDM  Pt wants refill on Trazodone and Xanax, denies any SI/HI. Does not need inpatient psychiatric admission. Advised outpatient followup for long term management.     I personally performed the services described in this documentation, which was scribed in my presence. The recorded information has been reviewed and is accurate.       Charles B.  Bernette Mayers, MD 01/30/13 1945

## 2013-02-20 ENCOUNTER — Encounter (HOSPITAL_COMMUNITY): Payer: Self-pay | Admitting: *Deleted

## 2013-02-20 ENCOUNTER — Emergency Department (HOSPITAL_COMMUNITY)
Admission: EM | Admit: 2013-02-20 | Discharge: 2013-02-20 | Disposition: A | Discharge status: Home / Self Care | Payer: Medicare Other | Attending: Emergency Medicine | Admitting: Emergency Medicine

## 2013-02-20 DIAGNOSIS — Z3202 Encounter for pregnancy test, result negative: Secondary | ICD-10-CM | POA: Insufficient documentation

## 2013-02-20 DIAGNOSIS — F17200 Nicotine dependence, unspecified, uncomplicated: Secondary | ICD-10-CM | POA: Insufficient documentation

## 2013-02-20 DIAGNOSIS — Z8739 Personal history of other diseases of the musculoskeletal system and connective tissue: Secondary | ICD-10-CM | POA: Insufficient documentation

## 2013-02-20 DIAGNOSIS — F329 Major depressive disorder, single episode, unspecified: Secondary | ICD-10-CM | POA: Insufficient documentation

## 2013-02-20 DIAGNOSIS — I10 Essential (primary) hypertension: Secondary | ICD-10-CM | POA: Insufficient documentation

## 2013-02-20 DIAGNOSIS — Z79899 Other long term (current) drug therapy: Secondary | ICD-10-CM | POA: Insufficient documentation

## 2013-02-20 DIAGNOSIS — N946 Dysmenorrhea, unspecified: Secondary | ICD-10-CM | POA: Insufficient documentation

## 2013-02-20 DIAGNOSIS — F3289 Other specified depressive episodes: Secondary | ICD-10-CM | POA: Insufficient documentation

## 2013-02-20 DIAGNOSIS — N760 Acute vaginitis: Secondary | ICD-10-CM | POA: Insufficient documentation

## 2013-02-20 DIAGNOSIS — F411 Generalized anxiety disorder: Secondary | ICD-10-CM | POA: Insufficient documentation

## 2013-02-20 DIAGNOSIS — B9689 Other specified bacterial agents as the cause of diseases classified elsewhere: Secondary | ICD-10-CM

## 2013-02-20 HISTORY — DX: Unspecified osteoarthritis, unspecified site: M19.90

## 2013-02-20 LAB — URINALYSIS, ROUTINE W REFLEX MICROSCOPIC
Glucose, UA: NEGATIVE mg/dL
Ketones, ur: NEGATIVE mg/dL
Protein, ur: 30 mg/dL — AB
Urobilinogen, UA: 0.2 mg/dL (ref 0.0–1.0)

## 2013-02-20 LAB — URINE MICROSCOPIC-ADD ON

## 2013-02-20 LAB — WET PREP, GENITAL: Yeast Wet Prep HPF POC: NONE SEEN

## 2013-02-20 MED ORDER — KETOROLAC TROMETHAMINE 60 MG/2ML IM SOLN
60.0000 mg | Freq: Once | INTRAMUSCULAR | Status: AC
  Administered 2013-02-20: 60 mg via INTRAMUSCULAR
  Filled 2013-02-20: qty 2

## 2013-02-20 MED ORDER — NAPROXEN 250 MG PO TABS: 250.0000 mg | ORAL_TABLET | Freq: Two times a day (BID) | ORAL | Status: DC

## 2013-02-20 MED ORDER — HYDROCODONE-ACETAMINOPHEN 5-325 MG PO TABS: ORAL_TABLET | ORAL | Status: DC

## 2013-02-20 MED ORDER — METRONIDAZOLE 500 MG PO TABS: 500.0000 mg | ORAL_TABLET | Freq: Two times a day (BID) | ORAL | Status: DC

## 2013-02-20 MED ORDER — MORPHINE SULFATE 4 MG/ML IJ SOLN
4.0000 mg | Freq: Once | INTRAMUSCULAR | Status: AC
  Administered 2013-02-20: 4 mg via INTRAMUSCULAR
  Filled 2013-02-20: qty 1

## 2013-02-20 NOTE — ED Notes (Signed)
"  Heavy vaginal bleeding , onset today,  With cramping.

## 2013-02-20 NOTE — ED Provider Notes (Signed)
History     CSN: [id number]  Arrival date & time 02/20/13  2023   First MD Initiated Contact with Patient 02/20/13 2031      Chief Complaint  Patient presents with  . Vaginal Bleeding     HPI Pt was seen at 2055.   Per pt, c/o gradual onset and persistence of constant lower pelvic "cramping" pain that began today.  Pt states she had her menses 2 weeks ago, which "stopped and came back."  States she took tylenol and motrin without relief.  Denies dysuria, no vaginal discharge, no back pain, no N/V/D, no fevers, no CP/SOB.      Past Medical History  Diagnosis Date  . Depression   . Anxiety   . Hypertension   . Arthritis     Past Surgical History  Procedure Laterality Date  . Cesarean section      x 3  . Cholecystectomy    . Tonsillectomy    . Adenoidectomy    . Tubal ligation       History  Substance Use Topics  . Smoking status: Current Every Day Smoker -- 0.50 packs/day    Types: Cigarettes  . Smokeless tobacco: Not on file  . Alcohol Use: Yes     Comment: occasional     Review of Systems ROS: Statement: All systems negative except as marked or noted in the HPI; Constitutional: Negative for fever and chills. ; ; Eyes: Negative for eye pain, redness and discharge. ; ; ENMT: Negative for ear pain, hoarseness, nasal congestion, sinus pressure and sore throat. ; ; Cardiovascular: Negative for chest pain, palpitations, diaphoresis, dyspnea and peripheral edema. ; ; Respiratory: Negative for cough, wheezing and stridor. ; ; Gastrointestinal: Negative for nausea, vomiting, diarrhea, abdominal pain, blood in stool, hematemesis, jaundice and rectal bleeding. . ; ; Genitourinary: Negative for dysuria, flank pain and hematuria. ; ; GYN:  +pelvic cramping, +vaginal bleeding, no vaginal discharge, no vulvar pain.;; Musculoskeletal: Negative for back pain and neck pain. Negative for swelling and trauma.; ; Skin: Negative for pruritus, rash, abrasions, blisters, bruising and skin  lesion.; ; Neuro: Negative for headache, lightheadedness and neck stiffness. Negative for weakness, altered level of consciousness , altered mental status, extremity weakness, paresthesias, involuntary movement, seizure and syncope.        Allergies  Shellfish allergy; Codeine; and Penicillins  Home Medications   Current Outpatient Rx  Name  Route  Sig  Dispense  Refill  . ALPRAZolam (XANAX) 0.5 MG tablet   Oral   Take 1 tablet (0.5 mg total) by mouth 2 (two) times daily.   60 tablet   0   . cloNIDine (CATAPRES) 0.1 MG tablet   Oral   Take 0.1 mg by mouth 2 (two) times daily.         . diphenhydramine-acetaminophen (TYLENOL PM) 25-500 MG TABS   Oral   Take 2 tablets by mouth at bedtime as needed (sleep).         . traZODone (DESYREL) 100 MG tablet   Oral   Take 1 tablet (100 mg total) by mouth 2 (two) times daily.   60 tablet   0     BP 140/88  Pulse 78  Temp(Src) 98.5 F (36.9 C) (Oral)  Resp 20  Ht 5\' 3"  (1.6 m)  Wt 230 lb (104.327 kg)  BMI 40.75 kg/m2  SpO2 100%  LMP 02/07/2013  Physical Exam 2100: Physical examination:  Nursing notes reviewed; Vital signs and O2 SAT reviewed;  Constitutional: Well developed, Well nourished, Well hydrated, In no acute distress; Head:  Normocephalic, atraumatic; Eyes: EOMI, PERRL, No scleral icterus; ENMT: Mouth and pharynx normal, Mucous membranes moist; Neck: Supple, Full range of motion, No lymphadenopathy; Cardiovascular: Regular rate and rhythm, No murmur, rub, or gallop; Respiratory: Breath sounds clear & equal bilaterally, No rales, rhonchi, wheezes.  Speaking full sentences with ease, Normal respiratory effort/excursion; Chest: Nontender, Movement normal; Abdomen: Soft, +suprapubic tenderness to palp. No rebound or guarding. Nondistended, Normal bowel sounds; Genitourinary: No CVA tenderness; Pelvic exam performed with permission of pt and female ED tech assist during exam.  External genitalia w/o lesions. Vaginal vault  without discharge, dark blood in vault.  Cervix w/o lesions, not friable, GC/chlam and wet prep obtained and sent to lab.  Bimanual exam w/o CMT, +uterine and bilat pelvic tenderness.;; Extremities: Pulses normal, No tenderness, No edema, No calf edema or asymmetry.; Neuro: AA&Ox3, Major CN grossly intact.  Speech clear. No gross focal motor or sensory deficits in extremities.; Skin: Color normal, Warm, Dry.   ED Course  Procedures    MDM  MDM Reviewed: previous chart, nursing note and vitals Interpretation: labs   Results for orders placed during the hospital encounter of 02/20/13  WET PREP, GENITAL      Result Value Range   Yeast Wet Prep HPF POC NONE SEEN  NONE SEEN   Trich, Wet Prep NONE SEEN  NONE SEEN   Clue Cells Wet Prep HPF POC RARE (*) NONE SEEN   WBC, Wet Prep HPF POC RARE (*) NONE SEEN  URINALYSIS, ROUTINE W REFLEX MICROSCOPIC      Result Value Range   Color, Urine YELLOW  YELLOW   APPearance CLEAR  CLEAR   Specific Gravity, Urine >1.030 (*) 1.005 - 1.030   pH 6.0  5.0 - 8.0   Glucose, UA NEGATIVE  NEGATIVE mg/dL   Hgb urine dipstick LARGE (*) NEGATIVE   Bilirubin Urine SMALL (*) NEGATIVE   Ketones, ur NEGATIVE  NEGATIVE mg/dL   Protein, ur 30 (*) NEGATIVE mg/dL   Urobilinogen, UA 0.2  0.0 - 1.0 mg/dL   Nitrite NEGATIVE  NEGATIVE   Leukocytes, UA NEGATIVE  NEGATIVE  PREGNANCY, URINE      Result Value Range   Preg Test, Ur NEGATIVE  NEGATIVE  URINE MICROSCOPIC-ADD ON      Result Value Range   Squamous Epithelial / LPF FEW (*) RARE   WBC, UA 0-2  <3 WBC/hpf   RBC / HPF TOO NUMEROUS TO COUNT  <3 RBC/hpf   Bacteria, UA FEW (*) RARE    2215:  Udip contaminated.  Will tx for +BV; GC/chlam pending.  Improved after meds and wants to go home now.  Dx and testing d/w pt and family.  Questions answered.  Verb understanding, agreeable to d/c home with outpt f/u.            Laray Anger, DO 02/22/13 1629

## 2013-02-22 LAB — GC/CHLAMYDIA PROBE AMP: CT Probe RNA: NEGATIVE

## 2013-03-06 ENCOUNTER — Emergency Department (HOSPITAL_COMMUNITY)
Admission: EM | Admit: 2013-03-06 | Discharge: 2013-03-06 | Disposition: A | Discharge status: Home / Self Care | Payer: Medicare Other | Attending: Emergency Medicine | Admitting: Emergency Medicine

## 2013-03-06 ENCOUNTER — Encounter (HOSPITAL_COMMUNITY): Payer: Self-pay | Admitting: Emergency Medicine

## 2013-03-06 DIAGNOSIS — M129 Arthropathy, unspecified: Secondary | ICD-10-CM | POA: Insufficient documentation

## 2013-03-06 DIAGNOSIS — R34 Anuria and oliguria: Secondary | ICD-10-CM | POA: Insufficient documentation

## 2013-03-06 DIAGNOSIS — Z79899 Other long term (current) drug therapy: Secondary | ICD-10-CM | POA: Insufficient documentation

## 2013-03-06 DIAGNOSIS — R300 Dysuria: Secondary | ICD-10-CM | POA: Insufficient documentation

## 2013-03-06 DIAGNOSIS — F329 Major depressive disorder, single episode, unspecified: Secondary | ICD-10-CM | POA: Insufficient documentation

## 2013-03-06 DIAGNOSIS — Z3202 Encounter for pregnancy test, result negative: Secondary | ICD-10-CM | POA: Insufficient documentation

## 2013-03-06 DIAGNOSIS — I10 Essential (primary) hypertension: Secondary | ICD-10-CM | POA: Insufficient documentation

## 2013-03-06 DIAGNOSIS — R6883 Chills (without fever): Secondary | ICD-10-CM | POA: Insufficient documentation

## 2013-03-06 DIAGNOSIS — F411 Generalized anxiety disorder: Secondary | ICD-10-CM | POA: Insufficient documentation

## 2013-03-06 DIAGNOSIS — F3289 Other specified depressive episodes: Secondary | ICD-10-CM | POA: Insufficient documentation

## 2013-03-06 DIAGNOSIS — M545 Low back pain, unspecified: Secondary | ICD-10-CM | POA: Insufficient documentation

## 2013-03-06 DIAGNOSIS — R319 Hematuria, unspecified: Secondary | ICD-10-CM | POA: Insufficient documentation

## 2013-03-06 DIAGNOSIS — Z8619 Personal history of other infectious and parasitic diseases: Secondary | ICD-10-CM | POA: Insufficient documentation

## 2013-03-06 DIAGNOSIS — R3915 Urgency of urination: Secondary | ICD-10-CM | POA: Insufficient documentation

## 2013-03-06 DIAGNOSIS — N3090 Cystitis, unspecified without hematuria: Secondary | ICD-10-CM | POA: Insufficient documentation

## 2013-03-06 DIAGNOSIS — F17200 Nicotine dependence, unspecified, uncomplicated: Secondary | ICD-10-CM | POA: Insufficient documentation

## 2013-03-06 LAB — URINALYSIS, ROUTINE W REFLEX MICROSCOPIC
Glucose, UA: NEGATIVE mg/dL
Protein, ur: 100 mg/dL — AB
pH: 6 (ref 5.0–8.0)

## 2013-03-06 LAB — POCT PREGNANCY, URINE: Preg Test, Ur: NEGATIVE

## 2013-03-06 LAB — URINE MICROSCOPIC-ADD ON

## 2013-03-06 MED ORDER — SULFAMETHOXAZOLE-TRIMETHOPRIM 800-160 MG PO TABS: 1.0000 | ORAL_TABLET | Freq: Two times a day (BID) | ORAL | Status: DC

## 2013-03-06 MED ORDER — PHENAZOPYRIDINE HCL 200 MG PO TABS: 200.0000 mg | ORAL_TABLET | Freq: Three times a day (TID) | ORAL | Status: DC | PRN

## 2013-03-06 NOTE — ED Notes (Signed)
Patient c/o urinary frequency and pain with urination. Per patient blood in urine. Denies any discharge. Reports fever and chills last night. Per patient seen here 3 weeks ago and diagnosed with bacterial vaginosis in which she took flagyl for.

## 2013-03-06 NOTE — ED Provider Notes (Signed)
History    This chart was scribed for Rachel Gaskins, MD by Quintella Reichert, ED scribe.  This patient was seen in room APA01/APA01 and the patient's care was started at 10:15 AM.   CSN: [id number]  Arrival date & time 03/06/13  4098      Chief Complaint  Patient presents with  . Urinary Frequency     The history is provided by the patient. No language interpreter was used.   Rachel Ray is a 32 y.o. female who presents to the Emergency Department complaining of gradual onset, constant, gradually increasing urinary frequency that began yesterday, with associated dysuria, urinary urgency, decreased urine output, and hematuria.  Pt also reports mild lower back pain and chills.  She denies fever.  Pt was diagnosed in ED with bacterial vaginosis 3 weeks ago.  She has had a cholecystectomy and 3 C-sections,   Past Medical History  Diagnosis Date  . Depression   . Anxiety   . Hypertension   . Arthritis     Past Surgical History  Procedure Laterality Date  . Cesarean section      x 3  . Cholecystectomy    . Tonsillectomy    . Adenoidectomy    . Tubal ligation      Family History  Problem Relation Age of Onset  . Cancer Other   . Diabetes Other     History  Substance Use Topics  . Smoking status: Current Every Day Smoker -- 0.50 packs/day for 15 years    Types: Cigarettes  . Smokeless tobacco: Never Used  . Alcohol Use: Yes     Comment: occasional    OB History   Grav Para Term Preterm Abortions TAB SAB Ect Mult Living   4 3 3  1  1   3       Review of Systems A complete 10 system review of systems was obtained and all systems are negative except as noted in the HPI and PMH.    Allergies  Shellfish allergy; Codeine; and Penicillins  Home Medications   Current Outpatient Rx  Name  Route  Sig  Dispense  Refill  . ALPRAZolam (XANAX) 0.5 MG tablet   Oral   Take 1 tablet (0.5 mg total) by mouth 2 (two) times daily.   60 tablet   0   . cloNIDine  (CATAPRES) 0.1 MG tablet   Oral   Take 0.1 mg by mouth 2 (two) times daily.         . diphenhydramine-acetaminophen (TYLENOL PM) 25-500 MG TABS   Oral   Take 2 tablets by mouth at bedtime as needed (sleep).         Marland Kitchen HYDROcodone-acetaminophen (NORCO/VICODIN) 5-325 MG per tablet      1 or 2 tabs PO q6 hours prn pain   15 tablet   0   . metroNIDAZOLE (FLAGYL) 500 MG tablet   Oral   Take 1 tablet (500 mg total) by mouth 2 (two) times daily.   14 tablet   0   . naproxen (NAPROSYN) 250 MG tablet   Oral   Take 1 tablet (250 mg total) by mouth 2 (two) times daily with a meal.   14 tablet   0   . traZODone (DESYREL) 100 MG tablet   Oral   Take 1 tablet (100 mg total) by mouth 2 (two) times daily.   60 tablet   0     BP 119/76  Pulse 70  Temp(Src) 98.1  F (36.7 C) (Oral)  Resp 18  Ht 5\' 3"  (1.6 m)  Wt 230 lb (104.327 kg)  BMI 40.75 kg/m2  SpO2 98%  LMP 02/14/2013  Physical Exam  Nursing note and vitals reviewed. CONSTITUTIONAL: Well developed/well nourished HEAD: Normocephalic/atraumatic EYES: EOMI ENMT: Mucous membranes moist NECK: supple no meningeal signs CV: S1/S2 noted, no murmurs/rubs/gallops noted LUNGS: Lungs are clear to auscultation bilaterally, no apparent distress ABDOMEN: soft, nontender, no rebound or guarding GU:no cva tenderness.  Mild suprapubic. NEURO: Pt is awake/alert, moves all extremitiesx4 EXTREMITIES: pulses normal, full ROM SKIN: warm, color normal PSYCH: no abnormalities of mood noted   ED Course  Procedures  Oxygen Saturation is 98% on room air, normal by my interpretation.    COORDINATION OF CARE: 10:20 AM-Informed pt that symptoms are likely due to UTI, and discussed treatment plan which includes discharge with antibiotics with pt at bedside and pt agreed to plan.      Labs Reviewed  URINALYSIS, ROUTINE W REFLEX MICROSCOPIC     MDM  Nursing notes including past medical history and social history reviewed and  considered in documentation Labs/vital reviewed and considered       I personally performed the services described in this documentation, which was scribed in my presence. The recorded information has been reviewed and is accurate.      Rachel Gaskins, MD 03/06/13 1500

## 2013-03-07 ENCOUNTER — Emergency Department (HOSPITAL_COMMUNITY)
Admission: EM | Admit: 2013-03-07 | Discharge: 2013-03-07 | Disposition: A | Discharge status: Home / Self Care | Payer: Medicare Other | Attending: Emergency Medicine | Admitting: Emergency Medicine

## 2013-03-07 ENCOUNTER — Encounter (HOSPITAL_COMMUNITY): Payer: Self-pay

## 2013-03-07 DIAGNOSIS — F329 Major depressive disorder, single episode, unspecified: Secondary | ICD-10-CM | POA: Insufficient documentation

## 2013-03-07 DIAGNOSIS — N390 Urinary tract infection, site not specified: Secondary | ICD-10-CM

## 2013-03-07 DIAGNOSIS — Z9851 Tubal ligation status: Secondary | ICD-10-CM | POA: Insufficient documentation

## 2013-03-07 DIAGNOSIS — N898 Other specified noninflammatory disorders of vagina: Secondary | ICD-10-CM | POA: Insufficient documentation

## 2013-03-07 DIAGNOSIS — F3289 Other specified depressive episodes: Secondary | ICD-10-CM | POA: Insufficient documentation

## 2013-03-07 DIAGNOSIS — I10 Essential (primary) hypertension: Secondary | ICD-10-CM | POA: Insufficient documentation

## 2013-03-07 DIAGNOSIS — F411 Generalized anxiety disorder: Secondary | ICD-10-CM | POA: Insufficient documentation

## 2013-03-07 DIAGNOSIS — R300 Dysuria: Secondary | ICD-10-CM | POA: Insufficient documentation

## 2013-03-07 DIAGNOSIS — F17200 Nicotine dependence, unspecified, uncomplicated: Secondary | ICD-10-CM | POA: Insufficient documentation

## 2013-03-07 DIAGNOSIS — M129 Arthropathy, unspecified: Secondary | ICD-10-CM | POA: Insufficient documentation

## 2013-03-07 DIAGNOSIS — Z3202 Encounter for pregnancy test, result negative: Secondary | ICD-10-CM | POA: Insufficient documentation

## 2013-03-07 DIAGNOSIS — Z79899 Other long term (current) drug therapy: Secondary | ICD-10-CM | POA: Insufficient documentation

## 2013-03-07 DIAGNOSIS — Z9089 Acquired absence of other organs: Secondary | ICD-10-CM | POA: Insufficient documentation

## 2013-03-07 DIAGNOSIS — R319 Hematuria, unspecified: Secondary | ICD-10-CM | POA: Insufficient documentation

## 2013-03-07 LAB — WET PREP, GENITAL
Trich, Wet Prep: NONE SEEN
Yeast Wet Prep HPF POC: NONE SEEN

## 2013-03-07 LAB — URINALYSIS, ROUTINE W REFLEX MICROSCOPIC
Glucose, UA: NEGATIVE mg/dL
Nitrite: NEGATIVE
Specific Gravity, Urine: >1.03 — ABNORMAL HIGH (ref 1.005–1.030)
pH: 6 (ref 5.0–8.0)

## 2013-03-07 LAB — PREGNANCY, URINE: Preg Test, Ur: NEGATIVE

## 2013-03-07 LAB — URINE MICROSCOPIC-ADD ON

## 2013-03-07 MED ORDER — NAPROXEN 500 MG PO TABS: 500.0000 mg | ORAL_TABLET | Freq: Two times a day (BID) | ORAL | Status: DC

## 2013-03-07 MED ORDER — CEFTRIAXONE SODIUM 1 G IJ SOLR
1.0000 g | Freq: Once | INTRAMUSCULAR | Status: AC
  Administered 2013-03-07: 1 g via INTRAMUSCULAR
  Filled 2013-03-07: qty 10

## 2013-03-07 MED ORDER — LIDOCAINE HCL (PF) 1 % IJ SOLN
INTRAMUSCULAR | Status: AC
  Filled 2013-03-07: qty 5

## 2013-03-07 MED ORDER — KETOROLAC TROMETHAMINE 60 MG/2ML IM SOLN
60.0000 mg | Freq: Once | INTRAMUSCULAR | Status: AC
  Administered 2013-03-07: 60 mg via INTRAMUSCULAR
  Filled 2013-03-07: qty 2

## 2013-03-07 NOTE — ED Notes (Signed)
Patient with no complaints at this time. Respirations even and unlabored. Skin warm/dry. Discharge instructions reviewed with patient at this time. Patient given opportunity to voice concerns/ask questions. Patient discharged at this time and left Emergency Department with steady gait.   

## 2013-03-07 NOTE — ED Notes (Signed)
Pt states she was diagnosed with uti Monday morning and states the pain got worse overnight, states she is also passing blood now.

## 2013-03-07 NOTE — ED Notes (Signed)
Patient informed of pending discharge after shot time is complete.

## 2013-03-07 NOTE — ED Notes (Signed)
Patient initially stating she is unable to urinate for sample. RN offered to in and out cath patient for sterile sample, patient refusing and stating that she can urinate. States "there is no way you are going to cath me. It feels like something is stuck up there (refering to urethra)". Patient ambulatory to restroom with steady gait.

## 2013-03-07 NOTE — ED Provider Notes (Signed)
History     CSN: [id number]  Arrival date & time 03/07/13  0605   First MD Initiated Contact with Patient 03/07/13 [phone]      Chief Complaint  Patient presents with  . Abdominal Pain    (Consider location/radiation/quality/duration/timing/severity/associated sxs/prior treatment) Patient is a 32 y.o. female presenting with abdominal pain.  Abdominal Pain Associated symptoms: dysuria   Associated symptoms: no chest pain, no chills, no fever, no shortness of breath and no vomiting    Hx per PT, LMP 2 weeks ago now with blood in urine and midline pelvic discomfort described as "it feels like something os coming out" no vag discharge, no ABD pain otherwise, no dysuria/ urgency or frequency. Has h/o recent BV.  No F/C. No syncope. No n/v/d. Symptoms MOD in severity. DX with UTI yesterday, taking ABx and AZO with persistent symptoms Past Medical History  Diagnosis Date  . Depression   . Anxiety   . Hypertension   . Arthritis     Past Surgical History  Procedure Laterality Date  . Cesarean section      x 3  . Cholecystectomy    . Tonsillectomy    . Adenoidectomy    . Tubal ligation      Family History  Problem Relation Age of Onset  . Cancer Other   . Diabetes Other     History  Substance Use Topics  . Smoking status: Current Every Day Smoker -- 0.50 packs/day for 15 years    Types: Cigarettes  . Smokeless tobacco: Never Used  . Alcohol Use: Yes     Comment: occasional    OB History   Grav Para Term Preterm Abortions TAB SAB Ect Mult Living   4 3 3  1  1   3       Review of Systems  Constitutional: Negative for fever and chills.  HENT: Negative for neck pain and neck stiffness.   Eyes: Negative for pain.  Respiratory: Negative for shortness of breath.   Cardiovascular: Negative for chest pain.  Gastrointestinal: Negative for vomiting and abdominal pain.  Genitourinary: Positive for dysuria. Negative for menstrual problem.  Musculoskeletal: Negative for back  pain.  Skin: Negative for rash.  Neurological: Negative for headaches.  All other systems reviewed and are negative.    Allergies  Shellfish allergy; Codeine; and Penicillins  Home Medications   Current Outpatient Rx  Name  Route  Sig  Dispense  Refill  . ALPRAZolam (XANAX) 0.5 MG tablet   Oral   Take 1 tablet (0.5 mg total) by mouth 2 (two) times daily.   60 tablet   0   . cloNIDine (CATAPRES) 0.1 MG tablet   Oral   Take 0.1 mg by mouth 2 (two) times daily.         . diphenhydramine-acetaminophen (TYLENOL PM) 25-500 MG TABS   Oral   Take 2 tablets by mouth at bedtime as needed (sleep).         Marland Kitchen HYDROcodone-acetaminophen (NORCO/VICODIN) 5-325 MG per tablet      1 or 2 tabs PO q6 hours prn pain   15 tablet   0   . metroNIDAZOLE (FLAGYL) 500 MG tablet   Oral   Take 1 tablet (500 mg total) by mouth 2 (two) times daily.   14 tablet   0   . naproxen (NAPROSYN) 250 MG tablet   Oral   Take 1 tablet (250 mg total) by mouth 2 (two) times daily with a meal.  14 tablet   0   . phenazopyridine (PYRIDIUM) 200 MG tablet   Oral   Take 1 tablet (200 mg total) by mouth 3 (three) times daily as needed for pain.   6 tablet   0   . sulfamethoxazole-trimethoprim (SEPTRA DS) 800-160 MG per tablet   Oral   Take 1 tablet by mouth every 12 (twelve) hours.   10 tablet   0   . traZODone (DESYREL) 100 MG tablet   Oral   Take 1 tablet (100 mg total) by mouth 2 (two) times daily.   60 tablet   0     BP 117/83  Pulse 77  Temp(Src) 98.1 F (36.7 C) (Oral)  Resp 18  Ht 5\' 3"  (1.6 m)  Wt 230 lb (104.327 kg)  BMI 40.75 kg/m2  SpO2 98%  LMP 02/14/2013  Physical Exam  Constitutional: She is oriented to person, place, and time. She appears well-developed and well-nourished.  HENT:  Head: Normocephalic and atraumatic.  Eyes: EOM are normal. Pupils are equal, round, and reactive to light.  Neck: Neck supple.  Cardiovascular: Regular rhythm and intact distal pulses.    Pulmonary/Chest: Effort normal. No respiratory distress.  Genitourinary:  Mild white vag discharge, no CMT no adnexal tenderness, normal ext GU  Musculoskeletal: Normal range of motion. She exhibits no edema.  Neurological: She is alert and oriented to person, place, and time.  Skin: Skin is warm and dry.    ED Course  Procedures (including critical care time)  Results for orders placed during the hospital encounter of 03/07/13  WET PREP, GENITAL      Result Value Range   Yeast Wet Prep HPF POC NONE SEEN  NONE SEEN   Trich, Wet Prep NONE SEEN  NONE SEEN   Clue Cells Wet Prep HPF POC MANY (*) NONE SEEN   WBC, Wet Prep HPF POC NONE SEEN  NONE SEEN  URINALYSIS, ROUTINE W REFLEX MICROSCOPIC      Result Value Range   Color, Urine YELLOW  YELLOW   APPearance TURBID (*) CLEAR   Specific Gravity, Urine >1.030 (*) 1.005 - 1.030   pH 6.0  5.0 - 8.0   Glucose, UA NEGATIVE  NEGATIVE mg/dL   Hgb urine dipstick LARGE (*) NEGATIVE   Bilirubin Urine NEGATIVE  NEGATIVE   Ketones, ur TRACE (*) NEGATIVE mg/dL   Protein, ur 161 (*) NEGATIVE mg/dL   Urobilinogen, UA 0.2  0.0 - 1.0 mg/dL   Nitrite NEGATIVE  NEGATIVE   Leukocytes, UA LARGE (*) NEGATIVE  PREGNANCY, URINE      Result Value Range   Preg Test, Ur NEGATIVE  NEGATIVE  RPR      Result Value Range   RPR NON REACTIVE  NON REACTIVE  URINE MICROSCOPIC-ADD ON      Result Value Range   WBC, UA TOO NUMEROUS TO COUNT  <3 WBC/hpf   RBC / HPF TOO NUMEROUS TO COUNT  <3 RBC/hpf   Bacteria, UA MANY (*) RARE     Old records reviewed - UTI TNTC WBCs, IM Rocephin provided. IM toradol provided, U Cx E coli.   Plan followup outpatient. Prescription NSAIDs, continue antibiotics, urine culture reviewed with sensitivities pending. Return precautions provided. Patient recently treated for BV MDM  UTI symptoms with ED visit for the same yesterday. Patient now with new pelvic complaints and persistent UTI symptoms. IM antibiotics provided. Labs  reviewed as above.   Patient stable for discharge home at this time for outpatient followup  Vital signs and nursing notes and old records reviewed        Sunnie Nielsen, MD 03/08/13 [phone]

## 2013-03-08 LAB — URINE CULTURE: Colony Count: >=100000

## 2013-03-08 LAB — GC/CHLAMYDIA PROBE AMP: GC Probe RNA: NEGATIVE

## 2013-03-09 ENCOUNTER — Telehealth (HOSPITAL_COMMUNITY): Payer: Self-pay | Admitting: Emergency Medicine

## 2013-03-09 LAB — URINE CULTURE: Colony Count: 60000

## 2013-03-30 ENCOUNTER — Emergency Department (HOSPITAL_COMMUNITY)
Admission: EM | Admit: 2013-03-30 | Discharge: 2013-03-30 | Disposition: A | Discharge status: Home / Self Care | Payer: Medicare Other | Attending: Emergency Medicine | Admitting: Emergency Medicine

## 2013-03-30 ENCOUNTER — Encounter (HOSPITAL_COMMUNITY): Payer: Self-pay | Admitting: *Deleted

## 2013-03-30 DIAGNOSIS — I10 Essential (primary) hypertension: Secondary | ICD-10-CM | POA: Insufficient documentation

## 2013-03-30 DIAGNOSIS — Z880 Allergy status to penicillin: Secondary | ICD-10-CM | POA: Insufficient documentation

## 2013-03-30 DIAGNOSIS — M129 Arthropathy, unspecified: Secondary | ICD-10-CM | POA: Insufficient documentation

## 2013-03-30 DIAGNOSIS — F410 Panic disorder [episodic paroxysmal anxiety] without agoraphobia: Secondary | ICD-10-CM

## 2013-03-30 DIAGNOSIS — Z760 Encounter for issue of repeat prescription: Secondary | ICD-10-CM

## 2013-03-30 DIAGNOSIS — F17200 Nicotine dependence, unspecified, uncomplicated: Secondary | ICD-10-CM | POA: Insufficient documentation

## 2013-03-30 DIAGNOSIS — Z79899 Other long term (current) drug therapy: Secondary | ICD-10-CM | POA: Insufficient documentation

## 2013-03-30 DIAGNOSIS — F3289 Other specified depressive episodes: Secondary | ICD-10-CM | POA: Insufficient documentation

## 2013-03-30 DIAGNOSIS — F329 Major depressive disorder, single episode, unspecified: Secondary | ICD-10-CM | POA: Insufficient documentation

## 2013-03-30 MED ORDER — ALPRAZOLAM 0.5 MG PO TABS: 0.5000 mg | ORAL_TABLET | Freq: Two times a day (BID) | ORAL | Status: DC

## 2013-03-30 MED ORDER — TRAZODONE HCL 100 MG PO TABS: 100.0000 mg | ORAL_TABLET | Freq: Two times a day (BID) | ORAL | Status: DC

## 2013-03-30 NOTE — ED Notes (Signed)
nad noted prior to dc. Dc instructions reviewed with pt along with f/u with [facility]. Voiced understanding. 2 rx given to pt. C/o " slight" headache prior to dc. Ambulated out without difficulty.

## 2013-03-30 NOTE — ED Notes (Signed)
Reports ran out of xanax and trazadone last week - panic attacks started today.

## 2013-03-30 NOTE — ED Provider Notes (Signed)
History     CSN: [id number]  Arrival date & time 03/30/13  1825   First MD Initiated Contact with Patient 03/30/13 1901      Chief Complaint  Patient presents with  . panic attacks     (Consider location/radiation/quality/duration/timing/severity/associated sxs/prior treatment) HPI Comments: Pt presents to the ED with c/o being out of her xanax and trazadone for the past week. She states she is now beginning to have panic attacks. She c/o not being able to sleep. She states she can not see Dr Betti Cruz at [facility] for several weeks. She missed a scheduled appointment. She states [facility] told her to come to the ED for refills. She denies SI or HI ideation.   The history is provided by the patient.    Past Medical History  Diagnosis Date  . Depression   . Anxiety   . Hypertension   . Arthritis     Past Surgical History  Procedure Laterality Date  . Cesarean section      x 3  . Cholecystectomy    . Tonsillectomy    . Adenoidectomy    . Tubal ligation      Family History  Problem Relation Age of Onset  . Cancer Other   . Diabetes Other     History  Substance Use Topics  . Smoking status: Current Every Day Smoker -- 0.50 packs/day for 15 years    Types: Cigarettes  . Smokeless tobacco: Never Used  . Alcohol Use: Yes     Comment: occasional    OB History   Grav Para Term Preterm Abortions TAB SAB Ect Mult Living   4 3 3  1  1   3       Review of Systems  Constitutional: Negative for activity change.       All ROS Neg except as noted in HPI  HENT: Negative for nosebleeds and neck pain.   Eyes: Negative for photophobia and discharge.  Respiratory: Negative for cough, shortness of breath and wheezing.   Cardiovascular: Negative for chest pain and palpitations.  Gastrointestinal: Negative for abdominal pain and blood in stool.  Genitourinary: Negative for dysuria, frequency and hematuria.  Musculoskeletal: Positive for arthralgias. Negative for back pain.  Skin:  Negative.   Neurological: Negative for dizziness, seizures and speech difficulty.  Psychiatric/Behavioral: Negative for hallucinations and confusion. The patient is nervous/anxious.        Depression    Allergies  Shellfish allergy; Codeine; and Penicillins  Home Medications   Current Outpatient Rx  Name  Route  Sig  Dispense  Refill  . ALPRAZolam (XANAX) 0.5 MG tablet   Oral   Take 1 tablet (0.5 mg total) by mouth 2 (two) times daily.   60 tablet   0   . cloNIDine (CATAPRES) 0.1 MG tablet   Oral   Take 0.1 mg by mouth 2 (two) times daily.         . diphenhydramine-acetaminophen (TYLENOL PM) 25-500 MG TABS   Oral   Take 2 tablets by mouth at bedtime as needed (sleep).         Marland Kitchen HYDROcodone-acetaminophen (NORCO/VICODIN) 5-325 MG per tablet      1 or 2 tabs PO q6 hours prn pain   15 tablet   0   . metroNIDAZOLE (FLAGYL) 500 MG tablet   Oral   Take 1 tablet (500 mg total) by mouth 2 (two) times daily.   14 tablet   0   . naproxen (NAPROSYN) 250 MG  tablet   Oral   Take 1 tablet (250 mg total) by mouth 2 (two) times daily with a meal.   14 tablet   0   . naproxen (NAPROSYN) 500 MG tablet   Oral   Take 1 tablet (500 mg total) by mouth 2 (two) times daily.   30 tablet   0   . phenazopyridine (PYRIDIUM) 200 MG tablet   Oral   Take 1 tablet (200 mg total) by mouth 3 (three) times daily as needed for pain.   6 tablet   0   . sulfamethoxazole-trimethoprim (SEPTRA DS) 800-160 MG per tablet   Oral   Take 1 tablet by mouth every 12 (twelve) hours.   10 tablet   0   . traZODone (DESYREL) 100 MG tablet   Oral   Take 1 tablet (100 mg total) by mouth 2 (two) times daily.   60 tablet   0     BP 117/83  Pulse 85  Temp(Src) 98.3 F (36.8 C) (Oral)  Resp 16  Ht 5\' 3"  (1.6 m)  Wt 225 lb (102.059 kg)  BMI 39.87 kg/m2  SpO2 100%  LMP 03/23/2013  Physical Exam  Nursing note and vitals reviewed. Constitutional: She is oriented to person, place, and time.  She appears well-developed and well-nourished.  Non-toxic appearance.  HENT:  Head: Normocephalic.  Right Ear: Tympanic membrane and external ear normal.  Left Ear: Tympanic membrane and external ear normal.  Eyes: EOM and lids are normal. Pupils are equal, round, and reactive to light.  Neck: Normal range of motion. Neck supple. Carotid bruit is not present.  Cardiovascular: Normal rate, regular rhythm, normal heart sounds, intact distal pulses and normal pulses.   Pulmonary/Chest: Breath sounds normal. No respiratory distress.  Abdominal: Soft. Bowel sounds are normal. There is no tenderness. There is no guarding.  Musculoskeletal: Normal range of motion.  Lymphadenopathy:       Head (right side): No submandibular adenopathy present.       Head (left side): No submandibular adenopathy present.    She has no cervical adenopathy.  Neurological: She is alert and oriented to person, place, and time. She has normal strength. No cranial nerve deficit or sensory deficit.  Skin: Skin is warm and dry.  Psychiatric: She has a normal mood and affect. Her speech is normal.    ED Course  Procedures (including critical care time)  Labs Reviewed - No data to display No results found.   No diagnosis found.    MDM  I have reviewed nursing notes, vital signs, and all appropriate lab and imaging results for this patient. Case discussed with Dr Lynelle Doctor. Pt researched on the [facility] web side. No evidence that pt has been receiving xanax and trazodone from [facility], only ED. Rx for 10 days of xanax and trazodone given to the patient. Pt strongly advised that ED is not set up for refills, and to see [facility] tomorrow for additional evaluation and management of her difficulty sleeping and panic attacks.       Kathie Dike, PA-C 03/30/13 1958  Kathie Dike, PA-C 03/31/13 [phone]

## 2013-03-31 NOTE — ED Provider Notes (Signed)
Medical screening examination/treatment/procedure(s) were performed by non-physician practitioner and as supervising physician I was immediately available for consultation/collaboration. Devoria Albe, MD, FACEP  [facility] shows pt only has gotten # 50 xanax from our ED in March.   Ward Givens, MD 03/31/13 [phone]

## 2013-05-13 ENCOUNTER — Encounter (HOSPITAL_COMMUNITY): Payer: Self-pay | Admitting: Emergency Medicine

## 2013-05-13 ENCOUNTER — Emergency Department (HOSPITAL_COMMUNITY)
Admission: EM | Admit: 2013-05-13 | Discharge: 2013-05-13 | Disposition: A | Discharge status: Home / Self Care | Payer: Medicare Other | Attending: Emergency Medicine | Admitting: Emergency Medicine

## 2013-05-13 DIAGNOSIS — F17200 Nicotine dependence, unspecified, uncomplicated: Secondary | ICD-10-CM | POA: Insufficient documentation

## 2013-05-13 DIAGNOSIS — F329 Major depressive disorder, single episode, unspecified: Secondary | ICD-10-CM | POA: Insufficient documentation

## 2013-05-13 DIAGNOSIS — F3289 Other specified depressive episodes: Secondary | ICD-10-CM | POA: Insufficient documentation

## 2013-05-13 DIAGNOSIS — M129 Arthropathy, unspecified: Secondary | ICD-10-CM | POA: Insufficient documentation

## 2013-05-13 DIAGNOSIS — I10 Essential (primary) hypertension: Secondary | ICD-10-CM | POA: Insufficient documentation

## 2013-05-13 DIAGNOSIS — F411 Generalized anxiety disorder: Secondary | ICD-10-CM | POA: Insufficient documentation

## 2013-05-13 DIAGNOSIS — R51 Headache: Secondary | ICD-10-CM | POA: Insufficient documentation

## 2013-05-13 DIAGNOSIS — Z880 Allergy status to penicillin: Secondary | ICD-10-CM | POA: Insufficient documentation

## 2013-05-13 DIAGNOSIS — Z79899 Other long term (current) drug therapy: Secondary | ICD-10-CM | POA: Insufficient documentation

## 2013-05-13 DIAGNOSIS — F430 Acute stress reaction: Secondary | ICD-10-CM | POA: Insufficient documentation

## 2013-05-13 MED ORDER — PROCHLORPERAZINE EDISYLATE 5 MG/ML IJ SOLN
10.0000 mg | Freq: Once | INTRAMUSCULAR | Status: DC
  Filled 2013-05-13: qty 2

## 2013-05-13 MED ORDER — DEXAMETHASONE SODIUM PHOSPHATE 10 MG/ML IJ SOLN
10.0000 mg | Freq: Once | INTRAMUSCULAR | Status: AC
  Administered 2013-05-13: 10 mg via INTRAMUSCULAR
  Filled 2013-05-13: qty 1

## 2013-05-13 MED ORDER — DIPHENHYDRAMINE HCL 50 MG/ML IJ SOLN
50.0000 mg | Freq: Once | INTRAMUSCULAR | Status: AC
  Administered 2013-05-13: 50 mg via INTRAMUSCULAR
  Filled 2013-05-13: qty 1

## 2013-05-13 MED ORDER — PROCHLORPERAZINE EDISYLATE 5 MG/ML IJ SOLN
10.0000 mg | Freq: Once | INTRAMUSCULAR | Status: AC
  Administered 2013-05-13: 10 mg via INTRAMUSCULAR

## 2013-05-13 NOTE — ED Notes (Signed)
MD informed of patients request for another type of pain medication. No new orders received

## 2013-05-13 NOTE — ED Notes (Signed)
Patient asked if the doctor could give her a couple of refills on her medication.  Is out of all Rx meds and no refills left on them.  Advised to speak with the MD about same.

## 2013-05-13 NOTE — ED Provider Notes (Signed)
History     CSN: [id number]  Arrival date & time 05/13/13  Rachel Ray   First MD Initiated Contact with Patient 05/13/13 [phone]      Chief Complaint  Patient presents with  . Headache    (Consider location/radiation/quality/duration/timing/severity/associated sxs/prior treatment) HPI Comments: Rachel Ray is a 32 y.o. female who complains of headache that started today. She relates the headache has been caused by "stress."' The stress is multifactorial. Coincidentally, she has again run out of her trazodone and Xanax. She has not tried to see her therapist or psychiatrist, at [facility] for refills. She reports similar headaches 3 times a week. She has difficulty sleeping at night. She states that she has chronic anxiety. She did not try anything for the headache, today. There's been no fever, chills, nausea, vomiting, weakness, or dizziness. There are no other known modifying factors.  Patient is a 32 y.o. female presenting with headaches. The history is provided by the patient.  Headache   Past Medical History  Diagnosis Date  . Depression   . Anxiety   . Hypertension   . Arthritis     Past Surgical History  Procedure Laterality Date  . Cesarean section      x 3  . Cholecystectomy    . Tonsillectomy    . Adenoidectomy    . Tubal ligation      Family History  Problem Relation Age of Onset  . Cancer Other   . Diabetes Other     History  Substance Use Topics  . Smoking status: Current Every Day Smoker -- 0.50 packs/day for 15 years    Types: Cigarettes  . Smokeless tobacco: Never Used  . Alcohol Use: No    OB History   Grav Para Term Preterm Abortions TAB SAB Ect Mult Living   4 3 3  1  1   3       Review of Systems  Neurological: Positive for headaches.  All other systems reviewed and are negative.    Allergies  Shellfish allergy; Codeine; and Penicillins  Home Medications   Current Outpatient Rx  Name  Route  Sig  Dispense  Refill  .  diphenhydramine-acetaminophen (TYLENOL PM) 25-500 MG TABS   Oral   Take 2 tablets by mouth at bedtime as needed (sleep).         . ALPRAZolam (XANAX) 0.5 MG tablet   Oral   Take 1 tablet (0.5 mg total) by mouth 2 (two) times daily.   60 tablet   0   . ALPRAZolam (XANAX) 0.5 MG tablet   Oral   Take 1 tablet (0.5 mg total) by mouth 2 (two) times daily.   20 tablet   0   . cloNIDine (CATAPRES) 0.1 MG tablet   Oral   Take 0.1 mg by mouth daily as needed.          . naproxen (NAPROSYN) 500 MG tablet   Oral   Take 1 tablet (500 mg total) by mouth 2 (two) times daily.   30 tablet   0   . traZODone (DESYREL) 100 MG tablet   Oral   Take 1 tablet (100 mg total) by mouth 2 (two) times daily.   60 tablet   0   . traZODone (DESYREL) 100 MG tablet   Oral   Take 1 tablet (100 mg total) by mouth 2 (two) times daily.   20 tablet   0     BP 108/77  Pulse 51  Temp(Src) 98.1  F (36.7 C) (Oral)  Resp 20  Ht 5\' 3"  (1.6 m)  Wt 225 lb (102.059 kg)  BMI 39.87 kg/m2  SpO2 97%  LMP 05/08/2013  Physical Exam  Nursing note and vitals reviewed. Constitutional: She is oriented to person, place, and time. She appears well-developed and well-nourished.  HENT:  Head: Normocephalic and atraumatic.  Eyes: Conjunctivae and EOM are normal. Pupils are equal, round, and reactive to light.  Neck: Normal range of motion and phonation normal. Neck supple.  There is no meningismus, she can touch her chin to her chest easily  Cardiovascular: Normal rate, regular rhythm and intact distal pulses.   Pulmonary/Chest: Effort normal and breath sounds normal. She exhibits no tenderness.  Abdominal: Soft. She exhibits no distension. There is no tenderness. There is no guarding.  Musculoskeletal: Normal range of motion.  Lymphadenopathy:    She has no cervical adenopathy.  Neurological: She is alert and oriented to person, place, and time. She has normal strength. No cranial nerve deficit. She  exhibits normal muscle tone. Coordination normal.  Skin: Skin is warm and dry.  Psychiatric: She has a normal mood and affect. Her behavior is normal. Judgment and thought content normal.    ED Course  Procedures (including critical care time)  Medications  diphenhydrAMINE (BENADRYL) injection 50 mg (50 mg Intramuscular Given 05/13/13 0112)  dexamethasone (DECADRON) injection 10 mg (10 mg Intramuscular Given 05/13/13 0111)  prochlorperazine (COMPAZINE) injection 10 mg (10 mg Intramuscular Given 05/13/13 0113)     1:51 AM Reevaluation with update and discussion. After initial assessment and treatment, an updated evaluation reveals she now, feels pain, more on the right than the left side of her head. Diana Davenport L   03:55- Reevaluation with update and discussion. After initial assessment and treatment, an updated evaluation reveals she states that her headache is now better. She requested refills on her medications. I politely explained that as was previously stated, on her prior visit, we don't refill prescriptions. Lucca Ballo L        1. Headache       MDM  Nonspecific headache. Patient improved with treatment in the emergency department. Chronic insomnia, and medication noncompliance. No acute psychiatric instability. She is stable for discharge with outpatient management.  Nursing Notes Reviewed/ Care Coordinated, and agree without changes. Applicable Imaging Reviewed.  Interpretation of Laboratory Data incorporated into ED treatment    Plan: Home Medications- no prescriptions; Home Treatments and Observation- I recommend that she initiate usual treatments, as soon as possible; return here if the recommended treatment, does not improve the symptoms; Recommended follow up- she is instructed to followup with her psychiatric provider at the outpatient psychiatric facility.         Flint Melter, MD 05/13/13 [phone]

## 2013-05-13 NOTE — ED Notes (Signed)
Patient complaining of headache that started yesterday afternoon.

## 2013-05-13 NOTE — ED Notes (Signed)
Pain to right side of head now

## 2013-05-22 ENCOUNTER — Encounter (HOSPITAL_COMMUNITY): Payer: Self-pay | Admitting: *Deleted

## 2013-05-22 ENCOUNTER — Emergency Department (HOSPITAL_COMMUNITY)
Admission: EM | Admit: 2013-05-22 | Discharge: 2013-05-22 | Disposition: A | Discharge status: Home / Self Care | Payer: Medicare Other | Attending: Emergency Medicine | Admitting: Emergency Medicine

## 2013-05-22 DIAGNOSIS — Z8739 Personal history of other diseases of the musculoskeletal system and connective tissue: Secondary | ICD-10-CM | POA: Insufficient documentation

## 2013-05-22 DIAGNOSIS — F411 Generalized anxiety disorder: Secondary | ICD-10-CM | POA: Insufficient documentation

## 2013-05-22 DIAGNOSIS — F17200 Nicotine dependence, unspecified, uncomplicated: Secondary | ICD-10-CM | POA: Insufficient documentation

## 2013-05-22 DIAGNOSIS — F3289 Other specified depressive episodes: Secondary | ICD-10-CM | POA: Insufficient documentation

## 2013-05-22 DIAGNOSIS — Z880 Allergy status to penicillin: Secondary | ICD-10-CM | POA: Insufficient documentation

## 2013-05-22 DIAGNOSIS — F329 Major depressive disorder, single episode, unspecified: Secondary | ICD-10-CM | POA: Insufficient documentation

## 2013-05-22 DIAGNOSIS — Z79899 Other long term (current) drug therapy: Secondary | ICD-10-CM | POA: Insufficient documentation

## 2013-05-22 DIAGNOSIS — I10 Essential (primary) hypertension: Secondary | ICD-10-CM | POA: Insufficient documentation

## 2013-05-22 DIAGNOSIS — N390 Urinary tract infection, site not specified: Secondary | ICD-10-CM | POA: Insufficient documentation

## 2013-05-22 DIAGNOSIS — R350 Frequency of micturition: Secondary | ICD-10-CM | POA: Insufficient documentation

## 2013-05-22 LAB — URINALYSIS, ROUTINE W REFLEX MICROSCOPIC
Bilirubin Urine: NEGATIVE
Glucose, UA: NEGATIVE mg/dL
Ketones, ur: NEGATIVE mg/dL
Protein, ur: NEGATIVE mg/dL
pH: 6.5 (ref 5.0–8.0)

## 2013-05-22 LAB — URINE MICROSCOPIC-ADD ON

## 2013-05-22 MED ORDER — TRAZODONE HCL 100 MG PO TABS: 100.0000 mg | ORAL_TABLET | Freq: Every day | ORAL | Status: DC

## 2013-05-22 MED ORDER — ALPRAZOLAM 0.5 MG PO TABS: 0.5000 mg | ORAL_TABLET | Freq: Two times a day (BID) | ORAL | Status: DC

## 2013-05-22 MED ORDER — TRAZODONE HCL 100 MG PO TABS: 100.0000 mg | ORAL_TABLET | Freq: Two times a day (BID) | ORAL | Status: DC

## 2013-05-22 MED ORDER — QUETIAPINE FUMARATE 300 MG PO TABS: 300.0000 mg | ORAL_TABLET | Freq: Every day | ORAL | Status: DC

## 2013-05-22 MED ORDER — CIPROFLOXACIN HCL 500 MG PO TABS: 500.0000 mg | ORAL_TABLET | Freq: Two times a day (BID) | ORAL | Status: DC

## 2013-05-22 NOTE — ED Notes (Signed)
nad noted prior to dc. Dc instructions reviewed along with f/u with [facility]. Pt voiced understanding. 4 scripts were given to pt.

## 2013-05-22 NOTE — ED Notes (Signed)
Pt sent from [facility] , had an appt, but no doctors "showed up"  Says she is anxious,says she is depressed.  Out of medicine for 3 weeks. Says Dr Betti Cruz is no longer there.

## 2013-05-22 NOTE — ED Provider Notes (Signed)
History    This chart was scribed for Rachel Jakes, MD by Donne Anon, ED Scribe. This patient was seen in room [facility]03/[facility]03 and the patient's care was started at 1422.  CSN: [id number] Arrival date & time 05/22/13  1257  First MD Initiated Contact with Patient 05/22/13 1422     No chief complaint on file.   The history is provided by the patient. No language interpreter was used.   HPI Comments: Rachel Ray is a 32 y.o. female sent by [facility], who presents to the Emergency Department needing a medication refill. Dr. Betti Cruz was her physician, but he no longer works there, and no one else can fill her medication. She states she has been out of her medication for 3 weeks. She denies SI or HI. She also reports dysuria and increased frequency.  Past Medical History  Diagnosis Date  . Depression   . Anxiety   . Hypertension   . Arthritis    Past Surgical History  Procedure Laterality Date  . Cesarean section      x 3  . Cholecystectomy    . Tonsillectomy    . Adenoidectomy    . Tubal ligation     Family History  Problem Relation Age of Onset  . Cancer Other   . Diabetes Other    History  Substance Use Topics  . Smoking status: Current Every Day Smoker -- 0.50 packs/day for 15 years    Types: Cigarettes  . Smokeless tobacco: Never Used  . Alcohol Use: No   OB History   Grav Para Term Preterm Abortions TAB SAB Ect Mult Living   4 3 3  1  1   3      Review of Systems  Constitutional: Negative for fever and chills.  HENT: Negative for neck pain.   Respiratory: Negative for shortness of breath.   Cardiovascular: Negative for chest pain.  Gastrointestinal: Negative for nausea, vomiting, abdominal pain and diarrhea.  Genitourinary: Positive for dysuria and frequency.  Musculoskeletal: Negative for back pain.  Skin: Negative for pallor.  Neurological: Negative for headaches.  Hematological: Does not bruise/bleed easily.  Psychiatric/Behavioral: Negative for  suicidal ideas and confusion.    Allergies  Shellfish allergy; Codeine; and Penicillins  Home Medications   Current Outpatient Rx  Name  Route  Sig  Dispense  Refill  . ALPRAZolam (XANAX) 0.5 MG tablet   Oral   Take 1 tablet (0.5 mg total) by mouth 2 (two) times daily.   20 tablet   0   . ciprofloxacin (CIPRO) 500 MG tablet   Oral   Take 1 tablet (500 mg total) by mouth 2 (two) times daily.   14 tablet   0   . QUEtiapine (SEROQUEL) 300 MG tablet   Oral   Take 1 tablet (300 mg total) by mouth at bedtime.   10 tablet   0   . traZODone (DESYREL) 100 MG tablet   Oral   Take 1 tablet (100 mg total) by mouth at bedtime.   10 tablet   0      BP 125/66  Pulse 72  Temp(Src) 97.4 F (36.3 C) (Oral)  Resp 18  Ht 5\' 3"  (1.6 m)  Wt 225 lb (102.059 kg)  BMI 39.87 kg/m2  SpO2 100%  LMP 05/08/2013  Physical Exam  Nursing note and vitals reviewed. Constitutional: She is oriented to person, place, and time. She appears well-developed and well-nourished. No distress.  HENT:  Head: Normocephalic and  atraumatic.  Eyes: Conjunctivae and EOM are normal.  Neck: Normal range of motion. Neck supple. No tracheal deviation present.  Cardiovascular: Normal rate, regular rhythm and normal heart sounds.   Pulmonary/Chest: Effort normal and breath sounds normal. No respiratory distress. She has no wheezes. She has no rales. She exhibits no tenderness.  Abdominal: Soft. Bowel sounds are normal. There is no tenderness.  Musculoskeletal: Normal range of motion.  Neurological: She is alert and oriented to person, place, and time. No cranial nerve deficit. She exhibits normal muscle tone. Coordination normal.  Skin: Skin is warm and dry.  Psychiatric: She has a normal mood and affect. Her behavior is normal.    ED Course  Procedures (including critical care time) DIAGNOSTIC STUDIES: Oxygen Saturation is 100% on RA, normal by my interpretation.    COORDINATION OF CARE: 3:26 PM  Discussed treatment plan which includes medication refill and urinalysis with pt at bedside and pt agreed to plan.    Labs Reviewed  URINALYSIS, ROUTINE W REFLEX MICROSCOPIC - Abnormal; Notable for the following:    Specific Gravity, Urine <1.005 (*)    Leukocytes, UA SMALL (*)    All other components within normal limits  URINE MICROSCOPIC-ADD ON - Abnormal; Notable for the following:    Squamous Epithelial / LPF FEW (*)    All other components within normal limits   Results for orders placed during the hospital encounter of 05/22/13  URINALYSIS, ROUTINE W REFLEX MICROSCOPIC      Result Value Range   Color, Urine YELLOW  YELLOW   APPearance CLEAR  CLEAR   Specific Gravity, Urine <1.005 (*) 1.005 - 1.030   pH 6.5  5.0 - 8.0   Glucose, UA NEGATIVE  NEGATIVE mg/dL   Hgb urine dipstick NEGATIVE  NEGATIVE   Bilirubin Urine NEGATIVE  NEGATIVE   Ketones, ur NEGATIVE  NEGATIVE mg/dL   Protein, ur NEGATIVE  NEGATIVE mg/dL   Urobilinogen, UA 0.2  0.0 - 1.0 mg/dL   Nitrite NEGATIVE  NEGATIVE   Leukocytes, UA SMALL (*) NEGATIVE  URINE MICROSCOPIC-ADD ON      Result Value Range   Squamous Epithelial / LPF FEW (*) RARE   WBC, UA 3-6  <3 WBC/hpf   Bacteria, UA RARE  RARE      No results found. 1. UTI (lower urinary tract infection)     MDM  Questionable urinary tract infection will treat with Cipro. Your behavioral health meds have been renewed for the next 10 days. It'll be important to followup with day Mark in the meantime.  No suicidal or homicidal ideations currently. Patient originally came to see her medications refilled which can be done a day Mark.   I personally performed the services described in this documentation, which was scribed in my presence. The recorded information has been reviewed and is accurate.     Rachel Jakes, MD 05/22/13 1726

## 2013-06-05 ENCOUNTER — Emergency Department (HOSPITAL_COMMUNITY)
Admission: EM | Admit: 2013-06-05 | Discharge: 2013-06-05 | Discharge status: Against Medical Advice | Payer: Medicare Other | Attending: Emergency Medicine | Admitting: Emergency Medicine

## 2013-06-05 ENCOUNTER — Encounter (HOSPITAL_COMMUNITY): Payer: Self-pay | Admitting: *Deleted

## 2013-06-05 DIAGNOSIS — I10 Essential (primary) hypertension: Secondary | ICD-10-CM | POA: Insufficient documentation

## 2013-06-05 DIAGNOSIS — F17200 Nicotine dependence, unspecified, uncomplicated: Secondary | ICD-10-CM | POA: Insufficient documentation

## 2013-06-05 DIAGNOSIS — N898 Other specified noninflammatory disorders of vagina: Secondary | ICD-10-CM | POA: Insufficient documentation

## 2013-06-05 NOTE — ED Notes (Signed)
abd ,back pain and  Vaginal bleeding

## 2014-09-24 ENCOUNTER — Encounter (HOSPITAL_COMMUNITY): Payer: Self-pay | Admitting: *Deleted

## 2015-09-10 ENCOUNTER — Emergency Department (HOSPITAL_COMMUNITY)
Admission: EM | Admit: 2015-09-10 | Discharge: 2015-09-10 | Disposition: A | Discharge status: Home / Self Care | Payer: Medicare Other | Attending: Emergency Medicine | Admitting: Emergency Medicine

## 2015-09-10 ENCOUNTER — Encounter (HOSPITAL_COMMUNITY): Payer: Self-pay | Admitting: *Deleted

## 2015-09-10 DIAGNOSIS — R42 Dizziness and giddiness: Secondary | ICD-10-CM | POA: Insufficient documentation

## 2015-09-10 DIAGNOSIS — M1990 Unspecified osteoarthritis, unspecified site: Secondary | ICD-10-CM | POA: Diagnosis not present

## 2015-09-10 DIAGNOSIS — F419 Anxiety disorder, unspecified: Secondary | ICD-10-CM | POA: Insufficient documentation

## 2015-09-10 DIAGNOSIS — Z79899 Other long term (current) drug therapy: Secondary | ICD-10-CM | POA: Diagnosis not present

## 2015-09-10 DIAGNOSIS — Z720 Tobacco use: Secondary | ICD-10-CM | POA: Diagnosis not present

## 2015-09-10 DIAGNOSIS — N939 Abnormal uterine and vaginal bleeding, unspecified: Secondary | ICD-10-CM

## 2015-09-10 DIAGNOSIS — Z880 Allergy status to penicillin: Secondary | ICD-10-CM | POA: Diagnosis not present

## 2015-09-10 DIAGNOSIS — Z3202 Encounter for pregnancy test, result negative: Secondary | ICD-10-CM | POA: Diagnosis not present

## 2015-09-10 DIAGNOSIS — Z86018 Personal history of other benign neoplasm: Secondary | ICD-10-CM

## 2015-09-10 DIAGNOSIS — I10 Essential (primary) hypertension: Secondary | ICD-10-CM | POA: Insufficient documentation

## 2015-09-10 DIAGNOSIS — F329 Major depressive disorder, single episode, unspecified: Secondary | ICD-10-CM | POA: Insufficient documentation

## 2015-09-10 DIAGNOSIS — N938 Other specified abnormal uterine and vaginal bleeding: Secondary | ICD-10-CM | POA: Insufficient documentation

## 2015-09-10 HISTORY — DX: Leiomyoma of uterus, unspecified: D25.9

## 2015-09-10 LAB — CBC WITH DIFFERENTIAL/PLATELET
BASOS PCT: 0 %
Basophils Absolute: 0 10*3/uL (ref 0.0–0.1)
EOS PCT: 3 %
Eosinophils Absolute: 0.1 10*3/uL (ref 0.0–0.7)
HEMATOCRIT: 32.9 % — AB (ref 36.0–46.0)
Hemoglobin: 10.1 g/dL — ABNORMAL LOW (ref 12.0–15.0)
Lymphocytes Relative: 50 %
Lymphs Abs: 2.3 10*3/uL (ref 0.7–4.0)
MCH: 26 pg (ref 26.0–34.0)
MCHC: 30.7 g/dL (ref 30.0–36.0)
MCV: 84.8 fL (ref 78.0–100.0)
MONO ABS: 0.3 10*3/uL (ref 0.1–1.0)
MONOS PCT: 5 %
NEUTROS ABS: 1.9 10*3/uL (ref 1.7–7.7)
Neutrophils Relative %: 42 %
PLATELETS: 215 10*3/uL (ref 150–400)
RBC: 3.88 MIL/uL (ref 3.87–5.11)
RDW: 15.3 % (ref 11.5–15.5)
WBC: 4.6 10*3/uL (ref 4.0–10.5)

## 2015-09-10 LAB — WET PREP, GENITAL
Clue Cells Wet Prep HPF POC: NONE SEEN
Trich, Wet Prep: NONE SEEN
Yeast Wet Prep HPF POC: NONE SEEN

## 2015-09-10 LAB — I-STAT BETA HCG BLOOD, ED (MC, WL, AP ONLY): I-stat hCG, quantitative: <5 m[IU]/mL (ref <5)

## 2015-09-10 MED ORDER — SODIUM CHLORIDE 0.9 % IV SOLN
INTRAVENOUS | Status: DC
  Administered 2015-09-10: 21:00:00 via INTRAVENOUS

## 2015-09-10 MED ORDER — ONDANSETRON HCL 4 MG/2ML IJ SOLN
4.0000 mg | Freq: Once | INTRAMUSCULAR | Status: AC
  Administered 2015-09-10: 4 mg via INTRAMUSCULAR
  Filled 2015-09-10: qty 2

## 2015-09-10 MED ORDER — HYDROMORPHONE HCL 1 MG/ML IJ SOLN
0.5000 mg | Freq: Once | INTRAMUSCULAR | Status: AC
  Administered 2015-09-10: 0.5 mg via INTRAVENOUS
  Filled 2015-09-10: qty 1

## 2015-09-10 MED ORDER — ESTROGENS CONJUGATED 25 MG IJ SOLR
INTRAMUSCULAR | Status: AC
  Filled 2015-09-10: qty 25

## 2015-09-10 MED ORDER — OXYCODONE-ACETAMINOPHEN 5-325 MG PO TABS
1.0000 | ORAL_TABLET | Freq: Once | ORAL | Status: AC
  Administered 2015-09-10: 1 via ORAL
  Filled 2015-09-10: qty 1

## 2015-09-10 MED ORDER — STERILE WATER FOR INJECTION IJ SOLN
INTRAMUSCULAR | Status: AC
  Administered 2015-09-10: 10 mL
  Filled 2015-09-10: qty 10

## 2015-09-10 MED ORDER — PROMETHAZINE HCL 25 MG/ML IJ SOLN
12.5000 mg | Freq: Once | INTRAMUSCULAR | Status: AC
  Administered 2015-09-10: 12.5 mg via INTRAVENOUS
  Filled 2015-09-10: qty 1

## 2015-09-10 MED ORDER — MEGESTROL ACETATE 40 MG PO TABS: 40.0000 mg | ORAL_TABLET | Freq: Three times a day (TID) | ORAL | Status: DC

## 2015-09-10 MED ORDER — MEGESTROL ACETATE 40 MG PO TABS
ORAL_TABLET | ORAL | Status: AC
  Filled 2015-09-10: qty 3

## 2015-09-10 MED ORDER — ESTROGENS CONJUGATED 25 MG IJ SOLR
25.0000 mg | Freq: Once | INTRAMUSCULAR | Status: AC
  Administered 2015-09-10: 25 mg via INTRAVENOUS
  Filled 2015-09-10: qty 25

## 2015-09-10 MED ORDER — MEGESTROL ACETATE 40 MG PO TABS
120.0000 mg | ORAL_TABLET | Freq: Once | ORAL | Status: AC
  Administered 2015-09-10: 120 mg via ORAL
  Filled 2015-09-10: qty 3

## 2015-09-10 MED ORDER — HYDROCODONE-ACETAMINOPHEN 5-325 MG PO TABS: 1.0000 | ORAL_TABLET | ORAL | Status: DC | PRN

## 2015-09-10 NOTE — ED Notes (Signed)
Pt with lower abd pain and vaginal bleeding with passing of large clots, bleeding through two pads and tampon, hx of uterine fibroids

## 2015-09-10 NOTE — Discharge Instructions (Signed)
I spoke with Dr. Elonda Husky and if you will call the office and tell them you were evaluated in the ED and we spoke with him they will give you a follow up appointment in the office.  Do not take the narcotic if driving as it will make you sleepy.   Abnormal Uterine Bleeding Abnormal uterine bleeding can affect women at various stages in life, including teenagers, women in their reproductive years, pregnant women, and women who have reached menopause. Several kinds of uterine bleeding are considered abnormal, including:  Bleeding or spotting between periods.   Bleeding after sexual intercourse.   Bleeding that is heavier or more than normal.   Periods that last longer than usual.  Bleeding after menopause.  Many cases of abnormal uterine bleeding are minor and simple to treat, while others are more serious. Any type of abnormal bleeding should be evaluated by your health care provider. Treatment will depend on the cause of the bleeding. HOME CARE INSTRUCTIONS Monitor your condition for any changes. The following actions may help to alleviate any discomfort you are experiencing:  Avoid the use of tampons and douches as directed by your health care provider.  Change your pads frequently. You should get regular pelvic exams and Pap tests. Keep all follow-up appointments for diagnostic tests as directed by your health care provider.  SEEK MEDICAL CARE IF:   Your bleeding lasts more than 1 week.   You feel dizzy at times.  SEEK IMMEDIATE MEDICAL CARE IF:   You pass out.   You are changing pads every 15 to 30 minutes.   You have abdominal pain.  You have a fever.   You become sweaty or weak.   You are passing large blood clots from the vagina.   You start to feel nauseous and vomit. MAKE SURE YOU:   Understand these instructions.  Will watch your condition.  Will get help right away if you are not doing well or get worse.   This information is not intended to replace  advice given to you by your health care provider. Make sure you discuss any questions you have with your health care provider.   Document Released: 11/09/2005 Document Revised: 11/14/2013 Document Reviewed: 06/08/2013 Elsevier Interactive Patient Education [employer].

## 2015-09-10 NOTE — ED Provider Notes (Signed)
CSN: [id number]     Arrival date & time 09/10/15  1851 History   First MD Initiated Contact with Patient 09/10/15 1943     Chief Complaint  Patient presents with  . Vaginal Bleeding     (Consider location/radiation/quality/duration/timing/severity/associated sxs/prior Treatment) Patient is a 35 y.o. female presenting with vaginal bleeding. The history is provided by the patient.  Vaginal Bleeding Quality:  Bright red, clots and heavier than menses Severity:  Severe Onset quality:  Gradual Duration:  2 days Timing:  Constant Progression:  Worsening Possible pregnancy: no   Relieved by:  Nothing Worsened by:  Nothing tried Ineffective treatments:  None tried Associated symptoms: abdominal pain    Rachel Ray is a [id number], and has had BTL for birth control presents to the ED with heavy vaginal bleeding and cramping. She reports that yesterday she began bleeding like a period and then today the bleeding got heavy with clots and abdominal cramping. Hx of uterine fibroids.   Past Medical History  Diagnosis Date  . Depression   . Anxiety   . Hypertension   . Arthritis   . Uterine fibroid    Past Surgical History  Procedure Laterality Date  . Cesarean section      x 3  . Cholecystectomy    . Tonsillectomy    . Adenoidectomy    . Tubal ligation     Family History  Problem Relation Age of Onset  . Cancer Other   . Diabetes Other    Social History  Substance Use Topics  . Smoking status: Current Every Day Smoker -- 0.50 packs/day for 15 years    Types: Cigarettes  . Smokeless tobacco: Never Used  . Alcohol Use: No    Review of Systems  Gastrointestinal: Positive for abdominal pain.  Genitourinary: Positive for vaginal bleeding and pelvic pain.  Neurological: Positive for light-headedness.   All other systems negative   Allergies  Shellfish allergy; Codeine; and Penicillins  Home Medications   Prior to Admission medications   Medication Sig Start Date End  Date Taking? Authorizing Provider  ALPRAZolam Duanne Moron) 0.5 MG tablet Take 1 tablet (0.5 mg total) by mouth 2 (two) times daily. 05/22/13   Fredia Sorrow, MD  HYDROcodone-acetaminophen (NORCO/VICODIN) 5-325 MG tablet Take 1 tablet by mouth every 4 (four) hours as needed. 09/10/15   Hope Bunnie Pion, NP  ibuprofen (ADVIL,MOTRIN) 200 MG tablet Take 400 mg by mouth every 6 (six) hours as needed for pain.    Historical Provider, MD  megestrol (MEGACE) 40 MG tablet Take 1 tablet (40 mg total) by mouth 3 (three) times daily. 09/10/15   Hope Bunnie Pion, NP  NAPROXEN PO Take 1 tablet by mouth daily as needed.    Historical Provider, MD  QUEtiapine (SEROQUEL) 300 MG tablet Take 1 tablet (300 mg total) by mouth at bedtime. 05/22/13   Fredia Sorrow, MD  traZODone (DESYREL) 100 MG tablet Take 1 tablet (100 mg total) by mouth at bedtime. 05/22/13   Fredia Sorrow, MD   BP 120/80 mmHg  Pulse 68  Temp(Src) 98.9 F (37.2 C) (Oral)  Resp 20  Ht 5\' 3"  (1.6 m)  Wt 205 lb (92.987 kg)  BMI 36.32 kg/m2  SpO2 100%  LMP 09/09/2015 Physical Exam  Constitutional: She is oriented to person, place, and time. She appears well-developed and well-nourished. No distress.  Eyes: EOM are normal.  Neck: Neck supple.  Cardiovascular: Normal rate.   Pulmonary/Chest: Effort normal.  Abdominal: Soft. There is tenderness. There  is no rebound and no guarding.  Tender with palpation lower abdomen.  Genitourinary:  External genitalia without lesions, large blood with clots vaginal vault, cervix without lesions, mild CMT, bilateral adnexal tenderness, uterus enlarged, irregular, consistent with hx of fibroids.   Musculoskeletal: Normal range of motion.  Neurological: She is alert and oriented to person, place, and time. No cranial nerve deficit.  Skin: Skin is warm and dry.  Psychiatric: She has a normal mood and affect. Her behavior is normal.  Nursing note and vitals reviewed.   ED Course  Procedures (including critical care  time) Labs, IV NS, Premarin 25 mg IV,  Consult with Dr. Elonda Husky, will give Megace 120 mg PO prior to d/c and Rx for 120 mg daily until follow up in the office.   Labs Review Results for orders placed or performed during the hospital encounter of 09/10/15 (from the past 24 hour(s))  CBC with Differential/Platelet     Status: Abnormal   Collection Time: 09/10/15  7:56 PM  Result Value Ref Range   WBC 4.6 4.0 - 10.5 K/uL   RBC 3.88 3.87 - 5.11 MIL/uL   Hemoglobin 10.1 (L) 12.0 - 15.0 g/dL   HCT 32.9 (L) 36.0 - 46.0 %   MCV 84.8 78.0 - 100.0 fL   MCH 26.0 26.0 - 34.0 pg   MCHC 30.7 30.0 - 36.0 g/dL   RDW 15.3 11.5 - 15.5 %   Platelets 215 150 - 400 K/uL   Neutrophils Relative % 42 %   Neutro Abs 1.9 1.7 - 7.7 K/uL   Lymphocytes Relative 50 %   Lymphs Abs 2.3 0.7 - 4.0 K/uL   Monocytes Relative 5 %   Monocytes Absolute 0.3 0.1 - 1.0 K/uL   Eosinophils Relative 3 %   Eosinophils Absolute 0.1 0.0 - 0.7 K/uL   Basophils Relative 0 %   Basophils Absolute 0.0 0.0 - 0.1 K/uL  Wet prep, genital     Status: Abnormal   Collection Time: 09/10/15  8:20 PM  Result Value Ref Range   Yeast Wet Prep HPF POC NONE SEEN NONE SEEN   Trich, Wet Prep NONE SEEN NONE SEEN   Clue Cells Wet Prep HPF POC NONE SEEN NONE SEEN   WBC, Wet Prep HPF POC FEW (A) NONE SEEN  I-Stat Beta hCG blood, ED ([facility], [facility], [facility] only)     Status: None   Collection Time: 09/10/15  8:44 PM  Result Value Ref Range   I-stat hCG, quantitative <5.0 <5 mIU/mL   Comment 3             MDM  34 y.o. female with heavy vaginal bleeding stable for d/c without dizziness, she is not orthostatic and slightly decreased bleeding. She will continue Megace 40 mg. PO tid until she follows up with Dr. Elonda Husky. Hydrocodone 5/325 mg for pain and she will continue ibuprofen. Discussed with the patient clinical and lab findings and plan of care. All questioned fully answered. She will return if any problems arise.   Final diagnoses:  Abnormal vaginal bleeding         Ashley Murrain, NP 09/11/15 6546  Milton Ferguson, MD 09/11/15 [phone]

## 2015-09-12 ENCOUNTER — Telehealth: Payer: Self-pay | Admitting: *Deleted

## 2015-09-12 LAB — GC/CHLAMYDIA PROBE AMP (~~LOC~~) NOT AT ARMC
CHLAMYDIA, DNA PROBE: NEGATIVE
NEISSERIA GONORRHEA: NEGATIVE

## 2015-09-12 NOTE — Telephone Encounter (Signed)
Pt states was seen at [facility] ER for heavy vaginal bleeding and pain. Pt given Megace to stop bleeding and Hydrocodone for pain. Pt states continues to have pain and a lot of discomfort "down there." Pt advised to continue the Megace and Hydrocodone as prescribed by ER MD, get off her feet as much as possible and keep her appt with Dr. Elonda Husky tomorrow at [phone]. Pt also informed if pain increased and the pain meds not relieving advised to go to ER. Pt verbalized understanding.

## 2015-09-13 ENCOUNTER — Encounter: Payer: Self-pay | Admitting: Obstetrics & Gynecology

## 2015-09-13 ENCOUNTER — Ambulatory Visit (INDEPENDENT_AMBULATORY_CARE_PROVIDER_SITE_OTHER): Payer: Medicare Other | Admitting: Obstetrics & Gynecology

## 2015-09-13 VITALS — BP 120/72 | HR 62 | Ht 62.0 in | Wt 210.0 lb

## 2015-09-13 DIAGNOSIS — N921 Excessive and frequent menstruation with irregular cycle: Secondary | ICD-10-CM

## 2015-09-13 DIAGNOSIS — A499 Bacterial infection, unspecified: Secondary | ICD-10-CM | POA: Diagnosis not present

## 2015-09-13 DIAGNOSIS — N946 Dysmenorrhea, unspecified: Secondary | ICD-10-CM

## 2015-09-13 DIAGNOSIS — N760 Acute vaginitis: Secondary | ICD-10-CM

## 2015-09-13 DIAGNOSIS — B9689 Other specified bacterial agents as the cause of diseases classified elsewhere: Secondary | ICD-10-CM

## 2015-09-13 MED ORDER — MEGESTROL ACETATE 40 MG PO TABS: 40.0000 mg | ORAL_TABLET | Freq: Two times a day (BID) | ORAL | Status: DC

## 2015-09-13 MED ORDER — METRONIDAZOLE 500 MG PO TABS: 500.0000 mg | ORAL_TABLET | Freq: Two times a day (BID) | ORAL | Status: DC

## 2015-09-13 NOTE — Progress Notes (Signed)
Patient ID: Rachel Ray, female   DOB: 1981/05/17, 34 y.o.   MRN: [id number] Chief Complaint  Patient presents with  . abnormal heavy vaginal bleeding    seen at [facility] ER 10/18/1     Blood pressure 120/72, pulse 62, height 5\' 2"  (1.575 m), weight 210 lb (95.255 kg), last menstrual period 09/09/2015.  34 y.o. [id number] Patient's last menstrual period was 09/09/2015. The current method of family planning is tubal ligation.  Subjective Patient has a somewhat long history of heavy vaginal bleeding However recently is become significantly heavier She was seen in the emergency department had a hemoglobin of 10.1 She's been told in the past that she had uterine fibroids  Objective Gen. well-developed well-nourished female in no acute distress Normal external female genitalia no lesions no masses Vagina pink moist no discharge no blood no lesions Cervix without lesions multiparous Uterus enlarged slightly but could be because of her bleeding history Adnexa is negative no masses palpable  Pertinent ROS No burning with urination, frequency or urgency No nausea, vomiting or diarrhea Nor fever chills or other constitutional symptoms   Labs or studies All of her labs from her recent emergency department visit were reviewed    Impression Diagnoses this Encounter::   ICD-9-CM ICD-10-CM   1. Menometrorrhagia 626.2 N92.1 US Pelvis Complete     US Transvaginal Non-OB  2. BV (bacterial vaginosis) 616.10 N76.0    041.9 A49.9   3. Dysmenorrhea 625.3 N94.6 US Pelvis Complete     US Transvaginal Non-OB    Established relevant diagnosis(es):   Plan/Recommendations: Meds ordered this encounter  Medications  . escitalopram (LEXAPRO) 10 MG tablet    Sig: Take 10 mg by mouth daily.  . megestrol (MEGACE) 40 MG tablet    Sig: Take 1 tablet (40 mg total) by mouth 2 (two) times daily.    Dispense:  60 tablet    Refill:  3  . metroNIDAZOLE (FLAGYL) 500 MG tablet    Sig: Take 1 tablet (500 mg  total) by mouth 2 (two) times daily.    Dispense:  14 tablet    Refill:  0    Labs or Scans Ordered: Orders Placed This Encounter  Procedures  . US Pelvis Complete  . US Transvaginal Non-OB    Place on Megace for suppression and follow with ultrasounds and a month Evaluate that point for appropriate management going forward  Follow up No Follow-up on file.      All questions were answered.  Past Medical History  Diagnosis Date  . Depression   . Anxiety   . Hypertension   . Arthritis   . Uterine fibroid     Past Surgical History  Procedure Laterality Date  . Cesarean section      x 3  . Cholecystectomy    . Tonsillectomy    . Adenoidectomy    . Tubal ligation      OB History    Gravida Para Term Preterm AB TAB SAB Ectopic Multiple Living   4 3 3  1  1   3       Allergies  Allergen Reactions  . Shellfish Allergy Anaphylaxis, Shortness Of Breath and Swelling  . Codeine Other (See Comments)    Abdominal pain  . Penicillins Itching    Social History   Social History  . Marital Status: Legally Separated    Spouse Name: N/A  . Number of Children: N/A  . Years of Education: N/A   Social History Main  Topics  . Smoking status: Current Every Day Smoker -- 0.25 packs/day for 15 years    Types: Cigarettes  . Smokeless tobacco: Never Used  . Alcohol Use: No  . Drug Use: No  . Sexual Activity: Yes    Birth Control/ Protection: Surgical   Other Topics Concern  . None   Social History Narrative    Family History  Problem Relation Age of Onset  . Cancer Other     great grandmother-breast cancer  . Cancer Maternal Aunt     breast cancer  . Diabetes Maternal Grandmother   . Diabetes Maternal Grandfather

## 2015-09-17 ENCOUNTER — Telehealth: Payer: Self-pay | Admitting: *Deleted

## 2015-09-17 NOTE — Telephone Encounter (Signed)
Pt requesting a refill on the Hydrocodone for pain.  Pt saw Dr. Elonda Husky on 09/13/2015 for heavy vaginal bleeding and dysmenorrhea. Pt states vaginal bleeding has stopped with the Megace.

## 2015-09-18 ENCOUNTER — Telehealth: Payer: Self-pay | Admitting: Obstetrics & Gynecology

## 2015-09-18 MED ORDER — KETOROLAC TROMETHAMINE 10 MG PO TABS: 10.0000 mg | ORAL_TABLET | Freq: Three times a day (TID) | ORAL | Status: DC | PRN

## 2015-09-18 NOTE — Telephone Encounter (Signed)
Pt informed Toradol e-scribed.

## 2015-09-19 ENCOUNTER — Emergency Department (HOSPITAL_COMMUNITY)
Admission: EM | Admit: 2015-09-19 | Discharge: 2015-09-19 | Disposition: A | Discharge status: Home / Self Care | Payer: Medicare Other | Attending: Emergency Medicine | Admitting: Emergency Medicine

## 2015-09-19 ENCOUNTER — Encounter (HOSPITAL_COMMUNITY): Payer: Self-pay

## 2015-09-19 DIAGNOSIS — Z720 Tobacco use: Secondary | ICD-10-CM | POA: Insufficient documentation

## 2015-09-19 DIAGNOSIS — I10 Essential (primary) hypertension: Secondary | ICD-10-CM | POA: Diagnosis not present

## 2015-09-19 DIAGNOSIS — F329 Major depressive disorder, single episode, unspecified: Secondary | ICD-10-CM | POA: Diagnosis not present

## 2015-09-19 DIAGNOSIS — M545 Low back pain: Secondary | ICD-10-CM | POA: Diagnosis not present

## 2015-09-19 DIAGNOSIS — Z8742 Personal history of other diseases of the female genital tract: Secondary | ICD-10-CM | POA: Insufficient documentation

## 2015-09-19 DIAGNOSIS — Z792 Long term (current) use of antibiotics: Secondary | ICD-10-CM | POA: Insufficient documentation

## 2015-09-19 DIAGNOSIS — Z880 Allergy status to penicillin: Secondary | ICD-10-CM | POA: Diagnosis not present

## 2015-09-19 DIAGNOSIS — F419 Anxiety disorder, unspecified: Secondary | ICD-10-CM | POA: Diagnosis not present

## 2015-09-19 DIAGNOSIS — Z79899 Other long term (current) drug therapy: Secondary | ICD-10-CM | POA: Diagnosis not present

## 2015-09-19 DIAGNOSIS — Z86018 Personal history of other benign neoplasm: Secondary | ICD-10-CM | POA: Insufficient documentation

## 2015-09-19 DIAGNOSIS — M1990 Unspecified osteoarthritis, unspecified site: Secondary | ICD-10-CM | POA: Insufficient documentation

## 2015-09-19 MED ORDER — CYCLOBENZAPRINE HCL 10 MG PO TABS: 10.0000 mg | ORAL_TABLET | Freq: Three times a day (TID) | ORAL | Status: DC | PRN

## 2015-09-19 MED ORDER — ACETAMINOPHEN 325 MG PO TABS
650.0000 mg | ORAL_TABLET | Freq: Once | ORAL | Status: AC
  Administered 2015-09-19: 650 mg via ORAL
  Filled 2015-09-19: qty 2

## 2015-09-19 MED ORDER — CYCLOBENZAPRINE HCL 10 MG PO TABS
10.0000 mg | ORAL_TABLET | Freq: Once | ORAL | Status: AC
  Administered 2015-09-19: 10 mg via ORAL
  Filled 2015-09-19: qty 1

## 2015-09-19 MED ORDER — OXYCODONE-ACETAMINOPHEN 5-325 MG PO TABS
1.0000 | ORAL_TABLET | Freq: Once | ORAL | Status: AC
  Administered 2015-09-19: 1 via ORAL
  Filled 2015-09-19: qty 1

## 2015-09-19 NOTE — ED Provider Notes (Signed)
CSN: [id number]     Arrival date & time 09/19/15  0620 History   First MD Initiated Contact with Patient 09/19/15 0636     Chief Complaint  Patient presents with  . Back Pain     HPI Patient presents to emergency department complaining of worsening low back pain over the past week.  She believes that she could've aggravated this back when lifting heavy furniture.  She also has a history of dysfunctional uterine bleeding and uterine fibroids for which she is being managed by her gynecologist Dr. Elonda Husky.  She reports she is no longer having any vaginal bleeding.  She denies dysuria or urinary frequency.  No vaginal complaints.  No flank pain.  She reports her low back pain is exacerbated by running over and heavy lifting.  No weakness or numbness in her legs.  No abdominal pain.  No other complaints.  Pain is moderate in severity   Past Medical History  Diagnosis Date  . Depression   . Anxiety   . Hypertension   . Arthritis   . Uterine fibroid    Past Surgical History  Procedure Laterality Date  . Cesarean section      x 3  . Cholecystectomy    . Tonsillectomy    . Adenoidectomy    . Tubal ligation     Family History  Problem Relation Age of Onset  . Cancer Other     great grandmother-breast cancer  . Cancer Maternal Aunt     breast cancer  . Diabetes Maternal Grandmother   . Diabetes Maternal Grandfather    Social History  Substance Use Topics  . Smoking status: Current Every Day Smoker -- 0.25 packs/day for 15 years    Types: Cigarettes  . Smokeless tobacco: Never Used  . Alcohol Use: No   OB History    Gravida Para Term Preterm AB TAB SAB Ectopic Multiple Living   4 3 3  1  1   3      Review of Systems  All other systems reviewed and are negative.     Allergies  Shellfish allergy; Codeine; and Penicillins  Home Medications   Prior to Admission medications   Medication Sig Start Date End Date Taking? Authorizing Provider  ALPRAZolam Duanne Moron) 0.5 MG tablet  Take 1 tablet (0.5 mg total) by mouth 2 (two) times daily. Patient not taking: Reported on 09/13/2015 05/22/13   Fredia Sorrow, MD  cyclobenzaprine (FLEXERIL) 10 MG tablet Take 1 tablet (10 mg total) by mouth 3 (three) times daily as needed for muscle spasms. 09/19/15   Jola Schmidt, MD  escitalopram (LEXAPRO) 10 MG tablet Take 10 mg by mouth daily.    Historical Provider, MD  HYDROcodone-acetaminophen (NORCO/VICODIN) 5-325 MG tablet Take 1 tablet by mouth every 4 (four) hours as needed. 09/10/15   Hope Bunnie Pion, NP  ibuprofen (ADVIL,MOTRIN) 200 MG tablet Take 400 mg by mouth every 6 (six) hours as needed for pain.    Historical Provider, MD  ketorolac (TORADOL) 10 MG tablet Take 1 tablet (10 mg total) by mouth every 8 (eight) hours as needed. 09/18/15   Florian Buff, MD  megestrol (MEGACE) 40 MG tablet Take 1 tablet (40 mg total) by mouth 2 (two) times daily. 09/13/15   Florian Buff, MD  metroNIDAZOLE (FLAGYL) 500 MG tablet Take 1 tablet (500 mg total) by mouth 2 (two) times daily. 09/13/15   Florian Buff, MD  QUEtiapine (SEROQUEL) 300 MG tablet Take 1 tablet (300 mg  total) by mouth at bedtime. Patient not taking: Reported on 09/13/2015 05/22/13   Fredia Sorrow, MD  traZODone (DESYREL) 100 MG tablet Take 1 tablet (100 mg total) by mouth at bedtime. Patient not taking: Reported on 09/13/2015 05/22/13   Fredia Sorrow, MD   BP 136/86 mmHg  Pulse 76  Temp(Src) 98.7 F (37.1 C) (Oral)  Resp 18  Ht 5\' 3"  (1.6 m)  Wt 205 lb (92.987 kg)  BMI 36.32 kg/m2  SpO2 100%  LMP 09/09/2015 Physical Exam  Constitutional: She is oriented to person, place, and time. She appears well-developed and well-nourished. No distress.  HENT:  Head: Normocephalic and atraumatic.  Eyes: EOM are normal.  Neck: Normal range of motion.  Cardiovascular: Normal rate, regular rhythm and normal heart sounds.   Pulmonary/Chest: Effort normal and breath sounds normal.  Abdominal: Soft. She exhibits no distension. There  is no tenderness.  Musculoskeletal: Normal range of motion.  Mild paralumbar tenderness without lumbar point tenderness.  Normal strength of lower extremity.  Gait is normal  Neurological: She is alert and oriented to person, place, and time.  Skin: Skin is warm and dry.  Psychiatric: She has a normal mood and affect. Judgment normal.  Nursing note and vitals reviewed.   ED Course  Procedures (including critical care time) Labs Review Labs Reviewed - No data to display  Imaging Review No results found. I have personally reviewed and evaluated these images and lab results as part of my medical decision-making.   EKG Interpretation None      MDM   Final diagnoses:  Low back pain without sciatica, unspecified back pain laterality    Likely musculoskeletal low back pain.  Pain treated here in the emergency department.  Patient will have muscle relaxants added to her home regimen.  Primary care follow-up.  Gyn follow-up.  No back pain red flags.  No indication for imaging.    Jola Schmidt, MD 09/19/15 [phone]

## 2015-09-19 NOTE — ED Notes (Signed)
Pt reports lower back pain for over a week, states she thinks she aggravated it when she moved boxes and furniture this week.

## 2015-09-19 NOTE — Discharge Instructions (Signed)

## 2015-09-19 NOTE — ED Notes (Signed)
   09/19/15 0629  Genitourinary  Genitourinary (WDL) X  Urinary Status Continent  Urine Color Yellow/straw  Urine Appearance Cloudy  Pt c/o lower back and lower abd pain with urinary frequency.

## 2015-09-23 ENCOUNTER — Telehealth: Payer: Self-pay | Admitting: Obstetrics & Gynecology

## 2015-09-23 NOTE — Telephone Encounter (Signed)
Pt is requesting pain medication.  Pt states she is having a lot of pain in her abdomen and in her upper right thigh, Dr. Elonda Husky RX'd Toradol last week but it give her headaches so she did not pick up that RX.  She went to to ED and they gave her Flexeril and told her to follow up with her GYN, she states the Flexeril is not helping the pain.  Please advise.

## 2015-09-24 ENCOUNTER — Telehealth: Payer: Self-pay | Admitting: Obstetrics & Gynecology

## 2015-09-24 MED ORDER — HYDROCODONE-ACETAMINOPHEN 5-325 MG PO TABS: 1.0000 | ORAL_TABLET | ORAL | Status: DC | PRN

## 2015-10-08 ENCOUNTER — Telehealth: Payer: Self-pay | Admitting: Obstetrics & Gynecology

## 2015-10-08 MED ORDER — HYDROCODONE-ACETAMINOPHEN 5-325 MG PO TABS: 1.0000 | ORAL_TABLET | ORAL | Status: DC | PRN

## 2015-10-08 NOTE — Telephone Encounter (Signed)
Pt informed Hydrocodone at front desk for pick up.

## 2015-10-08 NOTE — Telephone Encounter (Signed)
Pt requesting refill on Hydrocodone until her appt on 09/13/2015. Pt states she never got the RX for Hydrocodone from 09/24/2015, she only had the RX at pharmacy for Toradol on 09/18/2015.

## 2015-10-14 ENCOUNTER — Other Ambulatory Visit: Payer: Medicare Other

## 2015-10-14 ENCOUNTER — Ambulatory Visit: Payer: Medicare Other | Admitting: Obstetrics & Gynecology

## 2015-10-15 ENCOUNTER — Encounter: Payer: Self-pay | Admitting: Obstetrics & Gynecology

## 2015-10-15 ENCOUNTER — Ambulatory Visit (INDEPENDENT_AMBULATORY_CARE_PROVIDER_SITE_OTHER): Payer: Medicare Other

## 2015-10-15 ENCOUNTER — Ambulatory Visit (INDEPENDENT_AMBULATORY_CARE_PROVIDER_SITE_OTHER): Payer: Medicare Other | Admitting: Obstetrics & Gynecology

## 2015-10-15 VITALS — BP 100/70 | HR 66 | Wt 204.0 lb

## 2015-10-15 DIAGNOSIS — N946 Dysmenorrhea, unspecified: Secondary | ICD-10-CM

## 2015-10-15 DIAGNOSIS — N921 Excessive and frequent menstruation with irregular cycle: Secondary | ICD-10-CM

## 2015-10-15 NOTE — Progress Notes (Signed)
Patient ID: Rachel Ray, female   DOB: Mar 09, 1981, 34 y.o.   MRN: [id number]   Follow up appointment for results  Chief Complaint  Patient presents with  . Follow-up    ultrasound today.    Blood pressure 100/70, pulse 66, weight 204 lb (92.534 kg).  US Transvaginal Non-ob  10/15/2015  GYNECOLOGIC SONOGRAM Rachel Ray is a 34 y.o. [id number] LMP 10/09/2015 for a pelvic sonogram for menometrorrhagia and fibroids. Uterus                      9.2 x 5.2 x 6.4 cm,  heterogenous anteverted uterus w/ a 2.3 x 1.7 x 2.3cm ant.left subserosal fibroid Endometrium          9.5 mm, symmetrical, wnl Right ovary             2.2 x 1.3 x 2.1 cm, wnl Left ovary                3.2 x 1.7 x 3.0 cm, wnl Technician Comments: PELVIC US TA/TV: heterogenous anteverted uterus w/ a 2.3 x 1.7 x 2.3cm ant.left subserosal fibroid,EEC 9.5 mm,normal ov's bilat (mobile),no free fluid seen [employer] 10/15/2015 3:10 PM Clinical Impression and recommendations: I have reviewed the sonogram results above, combined with the patient's current clinical course, below are my impressions and any appropriate recommendations for management based on the sonographic findings. Normal size uterus with small subserosal fibroid that is most likely clinically irrelevant Ovaries are normal No other abnormalities Bernis Schreur H 10/15/2015 3:55 PM   US Pelvis Complete  10/15/2015  GYNECOLOGIC SONOGRAM Rachel Ray is a 34 y.o. [id number] LMP 10/09/2015 for a pelvic sonogram for menometrorrhagia and fibroids. Uterus                      9.2 x 5.2 x 6.4 cm,  heterogenous anteverted uterus w/ a 2.3 x 1.7 x 2.3cm ant.left subserosal fibroid Endometrium          9.5 mm, symmetrical, wnl Right ovary             2.2 x 1.3 x 2.1 cm, wnl Left ovary                3.2 x 1.7 x 3.0 cm, wnl Technician Comments: PELVIC US TA/TV: heterogenous anteverted uterus w/ a 2.3 x 1.7 x 2.3cm ant.left subserosal fibroid,EEC 9.5 mm,normal ov's bilat (mobile),no free fluid seen  [employer] 10/15/2015 3:10 PM Clinical Impression and recommendations: I have reviewed the sonogram results above, combined with the patient's current clinical course, below are my impressions and any appropriate recommendations for management based on the sonographic findings. Normal size uterus with small subserosal fibroid that is most likely clinically irrelevant Ovaries are normal No other abnormalities Rachel Ray H 10/15/2015 3:55 PM    Discussed options of continuing megestrol vs endometrial ablation Pt wants to continue on megace for now and will decide if she wants to proceed with abaltion in the future  MEDS ordered this encounter: Meds ordered this encounter  Medications  . citalopram (CELEXA) 10 MG tablet    Sig: Take 10 mg by mouth daily.    Orders for this encounter: No orders of the defined types were placed in this encounter.    Plan:  As above  Follow Up:  Pt will decide   Face to face time:  10 minutes  Greater than 50% of the visit time was spent in counseling  and coordination of care with the patient.  The summary and outline of the counseling and care coordination is summarized in the note above.   All questions were answered.  Past Medical History  Diagnosis Date  . Depression   . Anxiety   . Hypertension   . Arthritis   . Uterine fibroid     Past Surgical History  Procedure Laterality Date  . Cesarean section      x 3  . Cholecystectomy    . Tonsillectomy    . Adenoidectomy    . Tubal ligation      OB History    Gravida Para Term Preterm AB TAB SAB Ectopic Multiple Living   4 3 3  1  1   3       Allergies  Allergen Reactions  . Shellfish Allergy Anaphylaxis, Shortness Of Breath and Swelling  . Codeine Other (See Comments)    Abdominal pain  . Penicillins Itching    Social History   Social History  . Marital Status: Legally Separated    Spouse Name: N/A  . Number of Children: N/A  . Years of Education: N/A   Social History  Main Topics  . Smoking status: Current Every Day Smoker -- 0.25 packs/day for 15 years    Types: Cigarettes  . Smokeless tobacco: Never Used  . Alcohol Use: No  . Drug Use: No  . Sexual Activity: Yes    Birth Control/ Protection: Surgical   Other Topics Concern  . None   Social History Narrative    Family History  Problem Relation Age of Onset  . Cancer Other     great grandmother-breast cancer  . Cancer Maternal Aunt     breast cancer  . Diabetes Maternal Grandmother   . Diabetes Maternal Grandfather

## 2015-10-15 NOTE — Progress Notes (Signed)
PELVIC US TA/TV: heterogenous anteverted uterus w/ a 2.3 x 1.7 x 2.3cm ant.left subserosal fibroid,EEC 9.5 mm,normal ov's bilat (mobile),no free fluid seen

## 2015-10-19 ENCOUNTER — Encounter (HOSPITAL_COMMUNITY): Payer: Self-pay | Admitting: *Deleted

## 2015-10-19 ENCOUNTER — Emergency Department (HOSPITAL_COMMUNITY)
Admission: EM | Admit: 2015-10-19 | Discharge: 2015-10-19 | Discharge status: Against Medical Advice | Payer: Medicare Other | Attending: Emergency Medicine | Admitting: Emergency Medicine

## 2015-10-19 DIAGNOSIS — R1032 Left lower quadrant pain: Secondary | ICD-10-CM | POA: Insufficient documentation

## 2015-10-19 DIAGNOSIS — F17210 Nicotine dependence, cigarettes, uncomplicated: Secondary | ICD-10-CM | POA: Diagnosis not present

## 2015-10-19 DIAGNOSIS — I10 Essential (primary) hypertension: Secondary | ICD-10-CM | POA: Diagnosis not present

## 2015-10-19 NOTE — ED Notes (Signed)
Pt c/o llq pain and spotting. Pt states she has fibroids and endometriosis and a history of spotting. Pt c/o pain.

## 2015-10-24 ENCOUNTER — Telehealth: Payer: Self-pay | Admitting: Obstetrics & Gynecology

## 2015-10-28 ENCOUNTER — Telehealth: Payer: Self-pay | Admitting: Obstetrics & Gynecology

## 2015-10-28 MED ORDER — HYDROCODONE-ACETAMINOPHEN 5-325 MG PO TABS: 1.0000 | ORAL_TABLET | ORAL | Status: DC | PRN

## 2015-10-28 NOTE — Telephone Encounter (Signed)
Pt requesting a refill on the Hydrocodone for cramping. Pt states continues to have vaginal bleeding, not as heavy with the Megace, but would like to proceed with the Endometrial Ablation. Please advise.

## 2015-10-29 NOTE — Telephone Encounter (Signed)
Pt informed Hydrocodone RX left at front desk for pick up. Pt scheduled for pre op appt on 11/04/2015 for endometrial ablation.

## 2015-11-04 ENCOUNTER — Encounter: Payer: Self-pay | Admitting: Obstetrics & Gynecology

## 2015-11-11 ENCOUNTER — Telehealth: Payer: Self-pay | Admitting: Obstetrics & Gynecology

## 2015-11-11 NOTE — Telephone Encounter (Signed)
Pt informed will discuss pain med refill at her appt for pre op tomorrow. Pt verbalized understanding.

## 2015-11-11 NOTE — Telephone Encounter (Signed)
Pt called stating that she needs a refill of her pain medication, please contact pt °

## 2015-11-11 NOTE — Telephone Encounter (Signed)
I will address her pain med issues at her appointment, she missed her last one

## 2015-11-12 ENCOUNTER — Encounter: Payer: Self-pay | Admitting: Obstetrics & Gynecology

## 2015-11-12 ENCOUNTER — Ambulatory Visit (INDEPENDENT_AMBULATORY_CARE_PROVIDER_SITE_OTHER): Payer: Medicare Other | Admitting: Obstetrics & Gynecology

## 2015-11-12 VITALS — BP 110/80 | HR 76 | Wt 210.0 lb

## 2015-11-12 DIAGNOSIS — N941 Unspecified dyspareunia: Secondary | ICD-10-CM | POA: Diagnosis not present

## 2015-11-12 DIAGNOSIS — N946 Dysmenorrhea, unspecified: Secondary | ICD-10-CM

## 2015-11-12 DIAGNOSIS — N921 Excessive and frequent menstruation with irregular cycle: Secondary | ICD-10-CM | POA: Diagnosis not present

## 2015-11-12 MED ORDER — HYDROCODONE-ACETAMINOPHEN 5-325 MG PO TABS: 1.0000 | ORAL_TABLET | ORAL | Status: DC | PRN

## 2015-11-12 NOTE — Progress Notes (Signed)
Patient ID: Rachel Ray, female   DOB: 10-Jul-1981, 34 y.o.   MRN: [id number]      Chief Complaint  Patient presents with  . Pre-op Exam    c/c having a lot of cramping.    Blood pressure 110/80, pulse 76, weight 210 lb (95.255 kg).  34 y.o. [id number] No LMP recorded. The current method of family planning is oral progesterone-only contraceptive.  Subjective Pt still having a lot of pain and irregular bleeding despite megestrol  Sonogram normal +dyspareunia  Discussed option ablation vs hysterectomy but with her pain issues and dysparunia will need hysterectomy   Objective   Pertinent ROS No burning with urination, frequency or urgency No nausea, vomiting or diarrhea Nor fever chills or other constitutional symptoms   Labs or studies Previous imaging studies    Impression Diagnoses this Encounter::   ICD-9-CM ICD-10-CM   1. Menometrorrhagia 626.2 N92.1   2. Dysmenorrhea 625.3 N94.6   3. Dyspareunia in female 625.0 N94.10     Established relevant diagnosis(es):   Plan/Recommendations: Meds ordered this encounter  Medications  . DISCONTD: HYDROcodone-acetaminophen (NORCO/VICODIN) 5-325 MG tablet    Sig: Take 1 tablet by mouth every 4 (four) hours as needed.    Dispense:  15 tablet    Refill:  0    Labs or Scans Ordered: No orders of the defined types were placed in this encounter.    Management:: As above recommend hysterectomy, pt to decide definitively  Follow up prn         Face to face time:  15 minutes  Greater than 50% of the visit time was spent in counseling and coordination of care with the patient.  The summary and outline of the counseling and care coordination is summarized in the note above.   All questions were answered.

## 2015-11-17 ENCOUNTER — Encounter (HOSPITAL_COMMUNITY): Payer: Self-pay | Admitting: Emergency Medicine

## 2015-11-17 ENCOUNTER — Emergency Department (HOSPITAL_COMMUNITY)
Admission: EM | Admit: 2015-11-17 | Discharge: 2015-11-17 | Disposition: A | Discharge status: Home / Self Care | Payer: Medicare Other | Attending: Emergency Medicine | Admitting: Emergency Medicine

## 2015-11-17 DIAGNOSIS — F329 Major depressive disorder, single episode, unspecified: Secondary | ICD-10-CM | POA: Diagnosis not present

## 2015-11-17 DIAGNOSIS — F419 Anxiety disorder, unspecified: Secondary | ICD-10-CM | POA: Diagnosis not present

## 2015-11-17 DIAGNOSIS — M1990 Unspecified osteoarthritis, unspecified site: Secondary | ICD-10-CM | POA: Diagnosis not present

## 2015-11-17 DIAGNOSIS — G8929 Other chronic pain: Secondary | ICD-10-CM | POA: Diagnosis not present

## 2015-11-17 DIAGNOSIS — Z880 Allergy status to penicillin: Secondary | ICD-10-CM | POA: Insufficient documentation

## 2015-11-17 DIAGNOSIS — Z79899 Other long term (current) drug therapy: Secondary | ICD-10-CM | POA: Insufficient documentation

## 2015-11-17 DIAGNOSIS — N939 Abnormal uterine and vaginal bleeding, unspecified: Secondary | ICD-10-CM | POA: Diagnosis present

## 2015-11-17 DIAGNOSIS — Z3202 Encounter for pregnancy test, result negative: Secondary | ICD-10-CM | POA: Insufficient documentation

## 2015-11-17 DIAGNOSIS — I10 Essential (primary) hypertension: Secondary | ICD-10-CM | POA: Insufficient documentation

## 2015-11-17 DIAGNOSIS — F17210 Nicotine dependence, cigarettes, uncomplicated: Secondary | ICD-10-CM | POA: Diagnosis not present

## 2015-11-17 HISTORY — DX: Pelvic and perineal pain: R10.2

## 2015-11-17 HISTORY — DX: Other chronic pain: G89.29

## 2015-11-17 LAB — CBC WITH DIFFERENTIAL/PLATELET
BASOS ABS: 0 10*3/uL (ref 0.0–0.1)
BASOS PCT: 0 %
EOS ABS: 0.1 10*3/uL (ref 0.0–0.7)
Eosinophils Relative: 1 %
HCT: 31.6 % — ABNORMAL LOW (ref 36.0–46.0)
HEMOGLOBIN: 9.9 g/dL — AB (ref 12.0–15.0)
Lymphocytes Relative: 44 %
Lymphs Abs: 2.2 10*3/uL (ref 0.7–4.0)
MCH: 26.5 pg (ref 26.0–34.0)
MCHC: 31.3 g/dL (ref 30.0–36.0)
MCV: 84.5 fL (ref 78.0–100.0)
MONOS PCT: 7 %
Monocytes Absolute: 0.3 10*3/uL (ref 0.1–1.0)
NEUTROS PCT: 48 %
Neutro Abs: 2.4 10*3/uL (ref 1.7–7.7)
Platelets: 219 10*3/uL (ref 150–400)
RBC: 3.74 MIL/uL — AB (ref 3.87–5.11)
RDW: 15.4 % (ref 11.5–15.5)
WBC: 5.1 10*3/uL (ref 4.0–10.5)

## 2015-11-17 LAB — BASIC METABOLIC PANEL
ANION GAP: 8 (ref 5–15)
BUN: 11 mg/dL (ref 6–20)
CALCIUM: 9 mg/dL (ref 8.9–10.3)
CHLORIDE: 108 mmol/L (ref 101–111)
CO2: 23 mmol/L (ref 22–32)
CREATININE: 0.92 mg/dL (ref 0.44–1.00)
GFR calc non Af Amer: >60 mL/min (ref >60)
Glucose, Bld: 84 mg/dL (ref 65–99)
Potassium: 3.8 mmol/L (ref 3.5–5.1)
SODIUM: 139 mmol/L (ref 135–145)

## 2015-11-17 LAB — URINE MICROSCOPIC-ADD ON

## 2015-11-17 LAB — URINALYSIS, ROUTINE W REFLEX MICROSCOPIC
GLUCOSE, UA: NEGATIVE mg/dL
LEUKOCYTES UA: NEGATIVE
Nitrite: NEGATIVE
PH: 7 (ref 5.0–8.0)
PROTEIN: 100 mg/dL — AB
Specific Gravity, Urine: 1.02 (ref 1.005–1.030)

## 2015-11-17 LAB — TYPE AND SCREEN
ABO/RH(D): O POS
ANTIBODY SCREEN: NEGATIVE

## 2015-11-17 LAB — PREGNANCY, URINE: PREG TEST UR: NEGATIVE

## 2015-11-17 MED ORDER — KETOROLAC TROMETHAMINE 30 MG/ML IJ SOLN
30.0000 mg | Freq: Once | INTRAMUSCULAR | Status: DC
  Filled 2015-11-17: qty 1

## 2015-11-17 MED ORDER — TRAMADOL HCL 50 MG PO TABS
50.0000 mg | ORAL_TABLET | Freq: Once | ORAL | Status: AC
  Administered 2015-11-17: 50 mg via ORAL
  Filled 2015-11-17: qty 1

## 2015-11-17 MED ORDER — SODIUM CHLORIDE 0.9 % IV BOLUS (SEPSIS)
1000.0000 mL | Freq: Once | INTRAVENOUS | Status: AC
  Administered 2015-11-17: 1000 mL via INTRAVENOUS

## 2015-11-17 MED ORDER — IBUPROFEN 800 MG PO TABS: 800.0000 mg | ORAL_TABLET | Freq: Three times a day (TID) | ORAL | Status: DC

## 2015-11-17 MED ORDER — IBUPROFEN 800 MG PO TABS
ORAL_TABLET | ORAL | Status: AC
  Filled 2015-11-17: qty 1

## 2015-11-17 MED ORDER — IBUPROFEN 800 MG PO TABS
800.0000 mg | ORAL_TABLET | Freq: Once | ORAL | Status: AC
  Administered 2015-11-17: 800 mg via ORAL

## 2015-11-17 MED ORDER — MEGESTROL ACETATE 40 MG PO TABS: 80.0000 mg | ORAL_TABLET | Freq: Two times a day (BID) | ORAL | Status: DC

## 2015-11-17 NOTE — Discharge Instructions (Signed)

## 2015-11-17 NOTE — ED Notes (Signed)
Pt reports vaginal bleeding started yesterday and was saturated with blood this am when she woke up. Changing tampon every hour

## 2015-11-17 NOTE — ED Provider Notes (Signed)
CSN: [id number]     Arrival date & time 11/17/15  1902 History   First MD Initiated Contact with Patient 11/17/15 1936     Chief Complaint  Patient presents with  . Vaginal Bleeding     (Consider location/radiation/quality/duration/timing/severity/associated sxs/prior Treatment) HPI Comments: The patient is a 34 year old female, she has had a prior history of 3 C-sections, she is known to have uterine fibroids, endometriosis and has a history of heavy vaginal bleeding which has been evaluated by her gynecologist, recommended hysterectomy, she has a follow-up in the next week with her gynecologist to plan this. She has been on Megace for the last 2 months because of heavy vaginal bleeding and spotting and over that time. She has done very well with only occasional spotting. She now reports that over the last 24 hours she has had some heavy vaginal bleeding, she denies nausea vomiting fevers or chills but has had some lower abdominal cramping as well as a burning sensation over the suprapubic area. She denies dysuria or back pain.  Patient is a 34 y.o. female presenting with vaginal bleeding. The history is provided by the patient.  Vaginal Bleeding   Past Medical History  Diagnosis Date  . Depression   . Anxiety   . Hypertension   . Arthritis   . Uterine fibroid   . Chronic pelvic pain in female    Past Surgical History  Procedure Laterality Date  . Cesarean section      x 3  . Cholecystectomy    . Tonsillectomy    . Adenoidectomy    . Tubal ligation     Family History  Problem Relation Age of Onset  . Cancer Other     great grandmother-breast cancer  . Cancer Maternal Aunt     breast cancer  . Diabetes Maternal Grandmother   . Diabetes Maternal Grandfather    Social History  Substance Use Topics  . Smoking status: Current Every Day Smoker -- 0.25 packs/day for 15 years    Types: Cigarettes  . Smokeless tobacco: Never Used  . Alcohol Use: No   OB History    Gravida  Para Term Preterm AB TAB SAB Ectopic Multiple Living   4 3 3  1  1   3      Review of Systems  Genitourinary: Positive for vaginal bleeding.  All other systems reviewed and are negative.     Allergies  Shellfish allergy; Codeine; and Penicillins  Home Medications   Prior to Admission medications   Medication Sig Start Date End Date Taking? Authorizing Provider  ALPRAZolam Duanne Moron) 0.5 MG tablet Take 1 tablet (0.5 mg total) by mouth 2 (two) times daily. 05/22/13   Fredia Sorrow, MD  citalopram (CELEXA) 10 MG tablet Take 10 mg by mouth daily.    Historical Provider, MD  cyclobenzaprine (FLEXERIL) 10 MG tablet Take 1 tablet (10 mg total) by mouth 3 (three) times daily as needed for muscle spasms. 09/19/15   Jola Schmidt, MD  HYDROcodone-acetaminophen (NORCO/VICODIN) 5-325 MG tablet Take 1 tablet by mouth every 4 (four) hours as needed. 11/12/15   Florian Buff, MD  ibuprofen (ADVIL,MOTRIN) 800 MG tablet Take 1 tablet (800 mg total) by mouth 3 (three) times daily. 11/17/15   Noemi Chapel, MD  megestrol (MEGACE) 40 MG tablet Take 1 tablet (40 mg total) by mouth 2 (two) times daily. 09/13/15   Florian Buff, MD  megestrol (MEGACE) 40 MG tablet Take 2 tablets (80 mg total) by mouth 2 (  two) times daily. Take twice daily until bleeding stops - consult with your Gynecologist before starting this medicine 11/17/15   Noemi Chapel, MD   BP 134/83 mmHg  Pulse 68  Temp(Src) 98.5 F (36.9 C) (Oral)  Resp 20  Ht 5\' 2"  (1.575 m)  Wt 210 lb (95.255 kg)  BMI 38.40 kg/m2  SpO2 100%  LMP 10/18/2015 Physical Exam  Constitutional: She appears well-developed and well-nourished. No distress.  HENT:  Head: Normocephalic and atraumatic.  Mouth/Throat: Oropharynx is clear and moist. No oropharyngeal exudate.  Eyes: Conjunctivae and EOM are normal. Pupils are equal, round, and reactive to light. Right eye exhibits no discharge. Left eye exhibits no discharge. No scleral icterus.  Neck: Normal range of  motion. Neck supple. No JVD present. No thyromegaly present.  Cardiovascular: Normal rate, regular rhythm, normal heart sounds and intact distal pulses.  Exam reveals no gallop and no friction rub.   No murmur heard. Pulmonary/Chest: Effort normal and breath sounds normal. No respiratory distress. She has no wheezes. She has no rales.  Abdominal: Soft. Bowel sounds are normal. She exhibits no distension and no mass. There is tenderness ( Minimal tenderness to the suprapubic area, there is no guarding or masses or peritoneal signs).  Musculoskeletal: Normal range of motion. She exhibits no edema or tenderness.  Lymphadenopathy:    She has no cervical adenopathy.  Neurological: She is alert. Coordination normal.  Skin: Skin is warm and dry. No rash noted. No erythema.  Psychiatric: She has a normal mood and affect. Her behavior is normal.  Nursing note and vitals reviewed.   ED Course  Procedures (including critical care time) Labs Review Labs Reviewed  URINALYSIS, ROUTINE W REFLEX MICROSCOPIC (NOT AT [facility]) - Abnormal; Notable for the following:    Color, Urine RED (*)    APPearance CLOUDY (*)    Hgb urine dipstick LARGE (*)    Bilirubin Urine SMALL (*)    Ketones, ur TRACE (*)    Protein, ur 100 (*)    All other components within normal limits  CBC WITH DIFFERENTIAL/PLATELET - Abnormal; Notable for the following:    RBC 3.74 (*)    Hemoglobin 9.9 (*)    HCT 31.6 (*)    All other components within normal limits  URINE MICROSCOPIC-ADD ON - Abnormal; Notable for the following:    Squamous Epithelial / LPF 0-5 (*)    Bacteria, UA RARE (*)    All other components within normal limits  PREGNANCY, URINE  BASIC METABOLIC PANEL  TYPE AND SCREEN    Imaging Review No results found. I have personally reviewed and evaluated these images and lab results as part of my medical decision-making.    MDM   Final diagnoses:  Vaginal bleeding    Overall the patient appears well, her  vital signs are normal, we'll check orthostatics, hemoglobin, pelvic exam.  D/w Dr. Nehemiah Settle at [facility], - has recommended doubling the megace dose - pt in agreement.  hgb at baseline  VS normal  Stable for d/c.  Noemi Chapel, MD 11/17/15 2127

## 2015-11-19 ENCOUNTER — Telehealth: Payer: Self-pay | Admitting: *Deleted

## 2015-11-19 NOTE — Telephone Encounter (Signed)
Pt states had to go to ER on 11/16/2005 for heavy vaginal bleeding soaking a tampon every hour. Pt states Megace dosage was doubled but when she takes the Megace makes her heart flutter, "doesn't like the way the Megace makes her feel." Please advise.

## 2015-11-20 NOTE — Telephone Encounter (Signed)
Pt has pre op appt for 11/25/2014 with Dr.Eure. Hydrocodone not refilled due to pt given Rx last week. Pt verbalized understanding.

## 2015-11-20 NOTE — Telephone Encounter (Signed)
The patient is aware that her only option to solve her issues is a TAHBSO  She should have an appt scheduled if not schedule one for a pre op for TAHBSO  She just got more hydrocodone last week

## 2015-11-26 ENCOUNTER — Ambulatory Visit (INDEPENDENT_AMBULATORY_CARE_PROVIDER_SITE_OTHER): Payer: Medicare Other | Admitting: Obstetrics & Gynecology

## 2015-11-26 ENCOUNTER — Encounter: Payer: Medicare Other | Admitting: Obstetrics & Gynecology

## 2015-11-26 ENCOUNTER — Encounter: Payer: Self-pay | Admitting: Obstetrics & Gynecology

## 2015-11-26 VITALS — BP 100/60 | HR 66 | Wt 209.0 lb

## 2015-11-26 DIAGNOSIS — N921 Excessive and frequent menstruation with irregular cycle: Secondary | ICD-10-CM

## 2015-11-26 DIAGNOSIS — N941 Unspecified dyspareunia: Secondary | ICD-10-CM | POA: Diagnosis not present

## 2015-11-26 DIAGNOSIS — N946 Dysmenorrhea, unspecified: Secondary | ICD-10-CM | POA: Diagnosis not present

## 2015-11-26 DIAGNOSIS — D500 Iron deficiency anemia secondary to blood loss (chronic): Secondary | ICD-10-CM | POA: Diagnosis not present

## 2015-11-26 MED ORDER — MEGESTROL ACETATE 40 MG PO TABS: ORAL_TABLET | ORAL | Status: DC

## 2015-11-26 MED ORDER — HYDROCODONE-ACETAMINOPHEN 5-325 MG PO TABS: 1.0000 | ORAL_TABLET | ORAL | Status: DC | PRN

## 2015-11-26 NOTE — Progress Notes (Signed)
Patient ID: Rachel Ray, female   DOB: 07-19-1981, 35 y.o.   MRN: [id number] Preoperative History and Physical  Rachel Ray is a 35 y.o. [id number] with Patient's last menstrual period was 11/16/2015. admitted for a TAHBSO .  Patient has ongoing menometrorrhagia that is not controllable on megestrol Additionally she has bilateral pelvic pain in the adnexal area reproducible on pelvic exam and during vaginal probe Also with >50% dyspareunia with thrusting   Pt aware the surgical decision is based on addressing all of these issues  PMH:    Past Medical History  Diagnosis Date  . Depression   . Anxiety   . Hypertension   . Arthritis   . Uterine fibroid   . Chronic pelvic pain in female     PSH:     Past Surgical History  Procedure Laterality Date  . Cesarean section      x 3  . Cholecystectomy    . Tonsillectomy    . Adenoidectomy    . Tubal ligation      POb/GynH:      OB History    Gravida Para Term Preterm AB TAB SAB Ectopic Multiple Living   4 3 3  1  1   3       SH:   Social History  Substance Use Topics  . Smoking status: Current Every Day Smoker -- 0.25 packs/day for 15 years    Types: Cigarettes  . Smokeless tobacco: Never Used  . Alcohol Use: No    FH:    Family History  Problem Relation Age of Onset  . Cancer Other     great grandmother-breast cancer  . Cancer Maternal Aunt     breast cancer  . Diabetes Maternal Grandmother   . Diabetes Maternal Grandfather      Allergies:  Allergies  Allergen Reactions  . Shellfish Allergy Anaphylaxis, Shortness Of Breath and Swelling  . Codeine Other (See Comments)    Abdominal pain  . Penicillins Itching    Medications:       Current outpatient prescriptions:  .  ALPRAZolam (XANAX) 0.5 MG tablet, Take 1 tablet (0.5 mg total) by mouth 2 (two) times daily., Disp: 20 tablet, Rfl: 0 .  citalopram (CELEXA) 10 MG tablet, Take 10 mg by mouth daily., Disp: , Rfl:  .  HYDROcodone-acetaminophen (NORCO/VICODIN)  5-325 MG tablet, Take 1 tablet by mouth every 4 (four) hours as needed., Disp: 30 tablet, Rfl: 0 .  ibuprofen (ADVIL,MOTRIN) 800 MG tablet, Take 1 tablet (800 mg total) by mouth 3 (three) times daily., Disp: 21 tablet, Rfl: 0 .  megestrol (MEGACE) 40 MG tablet, Take 3 tablets all at once daily, Disp: 90 tablet, Rfl: 1 .  cyclobenzaprine (FLEXERIL) 10 MG tablet, Take 1 tablet (10 mg total) by mouth 3 (three) times daily as needed for muscle spasms. (Patient not taking: Reported on 11/26/2015), Disp: 12 tablet, Rfl: 0  Review of Systems:   Review of Systems  Constitutional: Negative for fever, chills, weight loss, malaise/fatigue and diaphoresis.  HENT: Negative for hearing loss, ear pain, nosebleeds, congestion, sore throat, neck pain, tinnitus and ear discharge.   Eyes: Negative for blurred vision, double vision, photophobia, pain, discharge and redness.  Respiratory: Negative for cough, hemoptysis, sputum production, shortness of breath, wheezing and stridor.   Cardiovascular: Negative for chest pain, palpitations, orthopnea, claudication, leg swelling and PND.  Gastrointestinal: Positive for abdominal pain. Negative for heartburn, nausea, vomiting, diarrhea, constipation, blood in stool and melena.  Genitourinary: Negative  for dysuria, urgency, frequency, hematuria and flank pain.  Musculoskeletal: Negative for myalgias, back pain, joint pain and falls.  Skin: Negative for itching and rash.  Neurological: Negative for dizziness, tingling, tremors, sensory change, speech change, focal weakness, seizures, loss of consciousness, weakness and headaches.  Endo/Heme/Allergies: Negative for environmental allergies and polydipsia. Does not bruise/bleed easily.  Psychiatric/Behavioral: Negative for depression, suicidal ideas, hallucinations, memory loss and substance abuse. The patient is not nervous/anxious and does not have insomnia.      PHYSICAL EXAM:  Blood pressure 100/60, pulse 66, weight 209  lb (94.802 kg), last menstrual period 11/16/2015.    Vitals reviewed. Constitutional: She is oriented to person, place, and time. She appears well-developed and well-nourished.  HENT:  Head: Normocephalic and atraumatic.  Right Ear: External ear normal.  Left Ear: External ear normal.  Nose: Nose normal.  Mouth/Throat: Oropharynx is clear and moist.  Eyes: Conjunctivae and EOM are normal. Pupils are equal, round, and reactive to light. Right eye exhibits no discharge. Left eye exhibits no discharge. No scleral icterus.  Neck: Normal range of motion. Neck supple. No tracheal deviation present. No thyromegaly present.  Cardiovascular: Normal rate, regular rhythm, normal heart sounds and intact distal pulses.  Exam reveals no gallop and no friction rub.   No murmur heard. Respiratory: Effort normal and breath sounds normal. No respiratory distress. She has no wheezes. She has no rales. She exhibits no tenderness.  GI: Soft. Bowel sounds are normal. She exhibits no distension and no mass. There is tenderness. There is no rebound and no guarding.  Genitourinary:       Vulva is normal without lesions Vagina is pink moist without discharge Cervix normal in appearance and pap is normal Uterus is normal size, contour, position, consistency, mobility, non-tender but is on palpation Adnexa is negative tender on palpation Musculoskeletal: Normal range of motion. She exhibits no edema and no tenderness.  Neurological: She is alert and oriented to person, place, and time. She has normal reflexes. She displays normal reflexes. No cranial nerve deficit. She exhibits normal muscle tone. Coordination normal.  Skin: Skin is warm and dry. No rash noted. No erythema. No pallor.  Psychiatric: She has a normal mood and affect. Her behavior is normal. Judgment and thought content normal.    Labs: Results for orders placed or performed during the hospital encounter of 11/17/15 (from the past 336 hour(s))   Pregnancy, urine   Collection Time: 11/17/15  7:54 PM  Result Value Ref Range   Preg Test, Ur NEGATIVE NEGATIVE  Urinalysis, Routine w reflex microscopic (not at [facility])   Collection Time: 11/17/15  7:54 PM  Result Value Ref Range   Color, Urine RED (A) YELLOW   APPearance CLOUDY (A) CLEAR   Specific Gravity, Urine 1.020 1.005 - 1.030   pH 7.0 5.0 - 8.0   Glucose, UA NEGATIVE NEGATIVE mg/dL   Hgb urine dipstick LARGE (A) NEGATIVE   Bilirubin Urine SMALL (A) NEGATIVE   Ketones, ur TRACE (A) NEGATIVE mg/dL   Protein, ur 100 (A) NEGATIVE mg/dL   Nitrite NEGATIVE NEGATIVE   Leukocytes, UA NEGATIVE NEGATIVE  Urine microscopic-add on   Collection Time: 11/17/15  7:54 PM  Result Value Ref Range   Squamous Epithelial / LPF 0-5 (A) NONE SEEN   WBC, UA 6-30 0 - 5 WBC/hpf   RBC / HPF TOO NUMEROUS TO COUNT 0 - 5 RBC/hpf   Bacteria, UA RARE (A) NONE SEEN  CBC with Differential/Platelet   Collection Time:  11/17/15  8:03 PM  Result Value Ref Range   WBC 5.1 4.0 - 10.5 K/uL   RBC 3.74 (L) 3.87 - 5.11 MIL/uL   Hemoglobin 9.9 (L) 12.0 - 15.0 g/dL   HCT 31.6 (L) 36.0 - 46.0 %   MCV 84.5 78.0 - 100.0 fL   MCH 26.5 26.0 - 34.0 pg   MCHC 31.3 30.0 - 36.0 g/dL   RDW 15.4 11.5 - 15.5 %   Platelets 219 150 - 400 K/uL   Neutrophils Relative % 48 %   Neutro Abs 2.4 1.7 - 7.7 K/uL   Lymphocytes Relative 44 %   Lymphs Abs 2.2 0.7 - 4.0 K/uL   Monocytes Relative 7 %   Monocytes Absolute 0.3 0.1 - 1.0 K/uL   Eosinophils Relative 1 %   Eosinophils Absolute 0.1 0.0 - 0.7 K/uL   Basophils Relative 0 %   Basophils Absolute 0.0 0.0 - 0.1 K/uL  Basic metabolic panel   Collection Time: 11/17/15  8:03 PM  Result Value Ref Range   Sodium 139 135 - 145 mmol/L   Potassium 3.8 3.5 - 5.1 mmol/L   Chloride 108 101 - 111 mmol/L   CO2 23 22 - 32 mmol/L   Glucose, Bld 84 65 - 99 mg/dL   BUN 11 6 - 20 mg/dL   Creatinine, Ser 0.92 0.44 - 1.00 mg/dL   Calcium 9.0 8.9 - 10.3 mg/dL   GFR calc non Af Amer >60  >60 mL/min   GFR calc Af Amer >60 >60 mL/min   Anion gap 8 5 - 15  Type and screen   Collection Time: 11/17/15  8:03 PM  Result Value Ref Range   ABO/RH(D) O POS    Antibody Screen NEG    Sample Expiration 11/20/2015     EKG: No orders found for this or any previous visit.  Imaging Studies: GYNECOLOGIC SONOGRAM   Rachel Ray is a 34 y.o. [id number] LMP 10/09/2015 for a pelvic sonogram for menometrorrhagia and fibroids.  Uterus 9.2 x 5.2 x 6.4 cm, heterogenous anteverted uterus w/ a 2.3 x 1.7 x 2.3cm ant.left subserosal fibroid  Endometrium 9.5 mm, symmetrical, wnl  Right ovary 2.2 x 1.3 x 2.1 cm, wnl  Left ovary 3.2 x 1.7 x 3.0 cm, wnl   Technician Comments:  PELVIC US TA/TV: heterogenous anteverted uterus w/ a 2.3 x 1.7 x 2.3cm ant.left subserosal fibroid,EEC 9.5 mm,normal ov's bilat (mobile),no free fluid seen    [employer] 10/15/2015 3:10 PM  Clinical Impression and recommendations:  I have reviewed the sonogram results above, combined with the patient's current clinical course, below are my impressions and any appropriate recommendations for management based on the sonographic findings.  Normal size uterus with small subserosal fibroid that is most likely clinically irrelevant Ovaries are normal No other abnormalities   EURE,LUTHER H 10/15/2015 3:55 PM    Assessment: Menometrorrhagia  Dysmenorrhea  Dyspareunia, female  Anemia   Plan: TAHBSO 12/11/2015  Pt understands the risks of surgery including but not limited t  excessive bleeding requiring transfusion or reoperation, post-operative infection requiring prolonged hospitalization or re-hospitalization and antibiotic therapy, and damage to other organs including bladder, bowel, ureters and major vessels.  The patient also understands the alternative treatment options which were discussed in full.  All questions were  answered.  EURE,LUTHER H 11/26/2015 3:24 PM   EURE,LUTHER H 11/26/2015 3:21 PM

## 2015-12-04 NOTE — Patient Instructions (Signed)
Rachel Ray Rachel Ray  12/04/2015     @PREFPERIOPPHARMACY @   Your procedure is scheduled on 12/11/2015 .  Report to [name] at  825  A.M.  Call this number if you have problems the morning of surgery:  [phone]   Remember:  Do not eat food or drink liquids after midnight.  Take these medicines the morning of surgery with A SIP OF WATER  Xanax, celexa, flexaril, hydrocodone.   Do not wear jewelry, make-up or nail polish.  Do not wear lotions, powders, or perfumes.  You may wear deodorant.  Do not shave 48 hours prior to surgery.  Men may shave face and neck.  Do not bring valuables to the hospital.  [facility] is not responsible for any belongings or valuables.  Contacts, dentures or bridgework may not be worn into surgery.  Leave your suitcase in the car.  After surgery it may be brought to your room.  For patients admitted to the hospital, discharge time will be determined by your treatment team.  Patients discharged the day of surgery will not be allowed to drive home.   Name and phone number of your driver:   family Special instructions:  none  Please read over the following fact sheets that you were given. Pain Booklet, Coughing and Deep Breathing, Surgical Site Infection Prevention, Anesthesia Post-op Instructions and Care and Recovery After Surgery      Bilateral Salpingo-Oophorectomy Bilateral salpingo-oophorectomy is the surgical removal of both fallopian tubes and both ovaries. The ovaries are small organs that produce eggs in women. The fallopian tubes transport the egg from the ovary to the womb (uterus). Usually, when this surgery is done, the uterus was previously removed. A bilateral salpingo-oophorectomy may be done to treat cancer or to reduce the risk of cancer in women who are at high risk. Removing both fallopian tubes and both ovaries will make you unable to become pregnant (sterile). It will also put you into menopause so that you will no  longer have menstrual periods and may have menopausal symptoms such as hot flashes, night sweats, and mood changes. It will not affect your sex drive. LET [facility] CARE PROVIDER KNOW ABOUT:  Any allergies you have.  All medicines you are taking, including vitamins, herbs, eye drops, creams, and over-the-counter medicines.  Previous problems you or members of your family have had with the use of anesthetics.  Any blood disorders you have.  Previous surgeries you have had.  Medical conditions you have. RISKS AND COMPLICATIONS Generally, this is a safe procedure. However, as with any procedure, complications can occur. Possible complications include:  Injury to surrounding organs.  Bleeding.  Infection.  Blood clots in the legs or lungs.  Problems related to anesthesia. BEFORE THE PROCEDURE  Ask your health care provider about changing or stopping your regular medicines. You may need to stop taking certain medicines, such as aspirin or blood thinners, at least 1 week before the surgery.  Do not eat or drink anything for at least 8 hours before the surgery.  If you smoke, do not smoke for at least 2 weeks before the surgery.  Make plans to have someone drive you home after the procedure or after your hospital stay. Also arrange for someone to help you with activities during recovery. PROCEDURE   You will be given medicine to help you relax before the procedure (sedative). You will then be given medicine to make you  sleep through the procedure (general anesthetic). These medicines will be given through an IV access tube that is put into one of your veins.  Once you are asleep, your lower abdomen will be shaved and cleaned. A thin, flexible tube (catheter) will be placed in your bladder.  The surgeon may use a laparoscopic, robotic, or open technique for this surgery:  In the laparoscopic technique, the surgery is done through two small cuts (incisions) in the abdomen. A thin,  lighted tube with a tiny camera on the end (laparoscope) is inserted into one of the incisions. The tools needed for the procedure are put through the other incision.  A robotic technique may be chosen to perform complex surgery in a small space. In the robotic technique, small incisions will be made. A camera and surgical instruments are passed through the incisions. Surgical instruments will be controlled with the help of a robotic arm.  In the open technique, the surgery is done through one large incision in the abdomen.  Using any of these techniques, the surgeon removes the fallopian tubes and ovaries. The blood vessels will be clamped and tied.  The surgeon then uses staples or stitches to close the incision or incisions. AFTER THE PROCEDURE  You will be taken to a recovery area where you will be monitored for 1 to 3 hours. Your blood pressure, pulse, and temperature will be checked often. You will remain in the recovery area until you are stable and waking up.  If the laparoscopic technique was used, you may be allowed to go home after several hours. You may have some shoulder pain after the laparoscopic procedure. This is normal and usually goes away in a day or two.  If the open technique was used, you will be admitted to the hospital for a couple of days.  You will be given pain medicine as needed.  The IV access tube and catheter will be removed before you are discharged.   This information is not intended to replace advice given to you by your health care provider. Make sure you discuss any questions you have with your health care provider.   Document Released: 11/09/2005 Document Revised: 11/14/2013 Document Reviewed: 05/03/2013 Elsevier Interactive Patient Education 2016 [place]. Abdominal Hysterectomy Abdominal hysterectomy is a surgical procedure to remove your womb (uterus). Your uterus is the muscular organ that contains a developing baby. This surgery is done for  many reasons. You may need an abdominal hysterectomy if you have cancer, growths (tumors), long-term pain, or bleeding. You may also have this procedure if your uterus has slipped down into your vagina (uterine prolapse). Depending on why you need an abdominal hysterectomy, you may also have other reproductive organs removed. These could include the part of your vagina that connects with your uterus (cervix), the organs that make eggs (ovaries), and the tubes that connect the ovaries to the uterus (fallopian tubes). LET [facility] CARE PROVIDER KNOW ABOUT:   Any allergies you have.  All medicines you are taking, including vitamins, herbs, eye drops, creams, and over-the-counter medicines.  Previous problems you or members of your family have had with the use of anesthetics.  Any blood disorders you have.  Previous surgeries you have had.  Medical conditions you have. RISKS AND COMPLICATIONS Generally, this is a safe procedure. However, as with any procedure, problems can occur. Infection is the most common problem after an abdominal hysterectomy. Other possible problems include:  Bleeding.  Formation of blood clots that may break  free and travel to your lungs.  Injury to other organs near your uterus.  Nerve injury causing nerve pain.  Decreased interest in sex or pain during sexual intercourse. BEFORE THE PROCEDURE  Abdominal hysterectomy is a major surgical procedure. It can affect the way you feel about yourself. Talk to your health care provider about the physical and emotional changes hysterectomy may cause.  You may need to have blood work and X-rays done before surgery.  Quit smoking if you smoke. Ask your health care provider for help if you are struggling to quit.  Stop taking medicines that thin your blood as directed by your health care provider.  You may be instructed to take antibiotic medicines or laxatives before surgery.  Do not eat or drink anything for 6-8  hours before surgery.  Take your regular medicines with a small sip of water.  Bathe or shower the night or morning before surgery. PROCEDURE  Abdominal hysterectomy is done in the operating room at the hospital.  In most cases, you will be given a medicine that makes you go to sleep (general anesthetic).  The surgeon will make a cut (incision) through the skin in your lower belly.  The incision may be about 5-7 inches long. It may go side-to-side or up-and-down.  The surgeon will move aside the body tissue that covers your uterus. The surgeon will then carefully take out your uterus along with any of your other reproductive organs that need to be removed.  Bleeding will be controlled with clamps or sutures.  The surgeon will close your incision with sutures or metal clips. AFTER THE PROCEDURE  You will have some pain immediately after the procedure.  You will be given pain medicine in the recovery room.  You will be taken to your hospital room when you have recovered from the anesthesia.  You may need to stay in the hospital for 2-5 days.  You will be given instructions for recovery at home.   This information is not intended to replace advice given to you by your health care provider. Make sure you discuss any questions you have with your health care provider.   Document Released: 11/14/2013 Document Reviewed: 11/14/2013 Elsevier Interactive Patient Education 2016 [place]. Abdominal Hysterectomy, Care After Refer to this sheet in the next few weeks. These instructions provide you with information on caring for yourself after your procedure. Your health care provider may also give you more specific instructions. Your treatment has been planned according to current medical practices, but problems sometimes occur. Call your health care provider if you have any problems or questions after your procedure.  WHAT TO EXPECT AFTER THE PROCEDURE After your procedure, it is  typical to have the following:  Pain.  Feeling tired.  Poor appetite.  Less interest in sex. It takes 4-6 weeks to recover from this surgery.  HOME CARE INSTRUCTIONS   Take pain medicines only as directed by your health care provider. Do not take over-the-counter pain medicines without checking with your health care provider first.  Change your bandage as directed by your health care provider.  Return to your health care provider to have your sutures taken out.  Take showers instead of baths for 2-3 weeks. Ask your health care provider when it is safe to start showering.  Do not douche, use tampons, or have sexual intercourse for at least 6 weeks or until your health care provider says you can.   Follow your health care provider's advice about exercise,  lifting, driving, and general activities.  Get plenty of rest and sleep.   Do not lift anything heavier than a gallon of milk (about 10 lb [4.5 kg]) for the first month after surgery.  You can resume your normal diet if your health care provider says it is okay.   Do not drink alcohol until your health care provider says you can.   If you are constipated, ask your health care provider if you can take a mild laxative.  Eating foods high in fiber may also help with constipation. Eat plenty of raw fruits and vegetables, whole grains, and beans.  Drink enough fluids to keep your urine clear or pale yellow.   Try to have someone at home with you for the first 1-2 weeks to help around the house.  Keep all follow-up appointments. SEEK MEDICAL CARE IF:   You have chills or fever.  You have swelling, redness, or pain in the area of your incision that is getting worse.   You have pus coming from the incision.   You notice a bad smell coming from the incision or bandage.   Your incision breaks open.   You feel dizzy or light-headed.   You have pain or bleeding when you urinate.   You have persistent diarrhea.    You have persistent nausea and vomiting.   You have abnormal vaginal discharge.   You have a rash.   You have any type of abnormal reaction or develop an allergy to your medicine.   Your pain medicine is not helping.  SEEK IMMEDIATE MEDICAL CARE IF:   You have a fever and your symptoms suddenly get worse.  You have severe abdominal pain.  You have chest pain.  You have shortness of breath.  You faint.  You have pain, swelling, or redness of your leg.  You have heavy vaginal bleeding with blood clots. MAKE SURE YOU:  Understand these instructions.  Will watch your condition.  Will get help right away if you are not doing well or get worse.   This information is not intended to replace advice given to you by your health care provider. Make sure you discuss any questions you have with your health care provider.   Document Released: 05/29/2005 Document Revised: 11/30/2014 Document Reviewed: 09/01/2013 Elsevier Interactive Patient Education 2016 [employer] PATIENT INSTRUCTIONS POST-ANESTHESIA  IMMEDIATELY FOLLOWING SURGERY:  Do not drive or operate machinery for the first twenty four hours after surgery.  Do not make any important decisions for twenty four hours after surgery or while taking narcotic pain medications or sedatives.  If you develop intractable nausea and vomiting or a severe headache please notify your doctor immediately.  FOLLOW-UP:  Please make an appointment with your surgeon as instructed. You do not need to follow up with anesthesia unless specifically instructed to do so.  WOUND CARE INSTRUCTIONS (if applicable):  Keep a dry clean dressing on the anesthesia/puncture wound site if there is drainage.  Once the wound has quit draining you may leave it open to air.  Generally you should leave the bandage intact for twenty four hours unless there is drainage.  If the epidural site drains for more than 36-48 hours please call the anesthesia  department.  QUESTIONS?:  Please feel free to call your physician or the hospital operator if you have any questions, and they will be happy to assist you.

## 2015-12-05 ENCOUNTER — Other Ambulatory Visit: Payer: Self-pay | Admitting: Obstetrics & Gynecology

## 2015-12-06 ENCOUNTER — Encounter (HOSPITAL_COMMUNITY)
Admission: RE | Admit: 2015-12-06 | Discharge: 2015-12-06 | Disposition: A | Discharge status: Home / Self Care | Payer: Medicare Other | Source: Ambulatory Visit | Attending: Obstetrics & Gynecology | Admitting: Obstetrics & Gynecology

## 2015-12-11 ENCOUNTER — Encounter (HOSPITAL_COMMUNITY): Admission: RE | Payer: Self-pay | Source: Ambulatory Visit

## 2015-12-11 ENCOUNTER — Inpatient Hospital Stay (HOSPITAL_COMMUNITY)
Admission: RE | Admit: 2015-12-11 | Payer: Medicare Other | Source: Ambulatory Visit | Admitting: Obstetrics & Gynecology

## 2015-12-11 SURGERY — HYSTERECTOMY, ABDOMINAL
Anesthesia: General

## 2015-12-18 ENCOUNTER — Encounter: Payer: Self-pay | Admitting: Obstetrics & Gynecology

## 2015-12-18 ENCOUNTER — Encounter: Payer: Medicare Other | Admitting: Obstetrics & Gynecology

## 2015-12-25 ENCOUNTER — Telehealth: Payer: Self-pay | Admitting: *Deleted

## 2015-12-25 NOTE — Telephone Encounter (Signed)
Pt states that she was supposed to have a hysterectomy on the 18th of January. Pt states that she got very scared and she was worried about it. Pt states that she wants to see Dr. Elonda Husky and see if there are other options instead of just a hysterectomy. Pt was advised that she would need to see Dr. Elonda Husky. And discuss what options she may have. Pt verbalized understanding. Pt was given an appointment with Dr. Elonda Husky.

## 2015-12-26 ENCOUNTER — Ambulatory Visit: Payer: Medicare Other | Admitting: Obstetrics & Gynecology

## 2016-03-09 ENCOUNTER — Emergency Department (HOSPITAL_COMMUNITY)
Admission: EM | Admit: 2016-03-09 | Discharge: 2016-03-09 | Disposition: A | Discharge status: Home / Self Care | Payer: Medicare Other | Attending: Dermatology | Admitting: Dermatology

## 2016-03-09 ENCOUNTER — Encounter (HOSPITAL_COMMUNITY): Payer: Self-pay | Admitting: Emergency Medicine

## 2016-03-09 DIAGNOSIS — F17210 Nicotine dependence, cigarettes, uncomplicated: Secondary | ICD-10-CM | POA: Insufficient documentation

## 2016-03-09 DIAGNOSIS — F329 Major depressive disorder, single episode, unspecified: Secondary | ICD-10-CM | POA: Diagnosis not present

## 2016-03-09 DIAGNOSIS — R1030 Lower abdominal pain, unspecified: Secondary | ICD-10-CM | POA: Diagnosis not present

## 2016-03-09 DIAGNOSIS — Z5321 Procedure and treatment not carried out due to patient leaving prior to being seen by health care provider: Secondary | ICD-10-CM | POA: Insufficient documentation

## 2016-03-09 DIAGNOSIS — I10 Essential (primary) hypertension: Secondary | ICD-10-CM | POA: Insufficient documentation

## 2016-03-09 LAB — COMPREHENSIVE METABOLIC PANEL
ALBUMIN: 3.5 g/dL (ref 3.5–5.0)
ALK PHOS: 42 U/L (ref 38–126)
ALT: 9 U/L — ABNORMAL LOW (ref 14–54)
AST: 15 U/L (ref 15–41)
Anion gap: 6 (ref 5–15)
BILIRUBIN TOTAL: 0.3 mg/dL (ref 0.3–1.2)
BUN: 8 mg/dL (ref 6–20)
CALCIUM: 8.4 mg/dL — AB (ref 8.9–10.3)
CO2: 23 mmol/L (ref 22–32)
Chloride: 111 mmol/L (ref 101–111)
Creatinine, Ser: 0.82 mg/dL (ref 0.44–1.00)
GFR calc Af Amer: >60 mL/min (ref >60)
GFR calc non Af Amer: >60 mL/min (ref >60)
GLUCOSE: 89 mg/dL (ref 65–99)
Potassium: 3.6 mmol/L (ref 3.5–5.1)
Sodium: 140 mmol/L (ref 135–145)
TOTAL PROTEIN: 7 g/dL (ref 6.5–8.1)

## 2016-03-09 LAB — CBC
HCT: 34.7 % — ABNORMAL LOW (ref 36.0–46.0)
Hemoglobin: 11.4 g/dL — ABNORMAL LOW (ref 12.0–15.0)
MCH: 28.6 pg (ref 26.0–34.0)
MCHC: 32.9 g/dL (ref 30.0–36.0)
MCV: 87.2 fL (ref 78.0–100.0)
PLATELETS: 189 10*3/uL (ref 150–400)
RBC: 3.98 MIL/uL (ref 3.87–5.11)
RDW: 14.2 % (ref 11.5–15.5)
WBC: 4.8 10*3/uL (ref 4.0–10.5)

## 2016-03-09 LAB — LIPASE, BLOOD: Lipase: 20 U/L (ref 11–51)

## 2016-03-09 NOTE — ED Notes (Signed)
Call for pt to take her to room, no answer 

## 2016-03-09 NOTE — ED Notes (Signed)
Patient complaining of lower right abdominal pain starting this morning. Denies vomiting, diarrhea.

## 2016-03-09 NOTE — ED Notes (Signed)
Call for pt, no answer 

## 2016-03-15 ENCOUNTER — Encounter (HOSPITAL_COMMUNITY): Payer: Self-pay | Admitting: Emergency Medicine

## 2016-03-15 ENCOUNTER — Emergency Department (HOSPITAL_COMMUNITY)
Admission: EM | Admit: 2016-03-15 | Discharge: 2016-03-15 | Disposition: A | Discharge status: Home / Self Care | Payer: Medicare Other | Attending: Emergency Medicine | Admitting: Emergency Medicine

## 2016-03-15 DIAGNOSIS — G8929 Other chronic pain: Secondary | ICD-10-CM | POA: Insufficient documentation

## 2016-03-15 DIAGNOSIS — N760 Acute vaginitis: Secondary | ICD-10-CM

## 2016-03-15 DIAGNOSIS — F17210 Nicotine dependence, cigarettes, uncomplicated: Secondary | ICD-10-CM | POA: Diagnosis not present

## 2016-03-15 DIAGNOSIS — R102 Pelvic and perineal pain: Secondary | ICD-10-CM | POA: Insufficient documentation

## 2016-03-15 DIAGNOSIS — Z791 Long term (current) use of non-steroidal anti-inflammatories (NSAID): Secondary | ICD-10-CM | POA: Diagnosis not present

## 2016-03-15 DIAGNOSIS — I10 Essential (primary) hypertension: Secondary | ICD-10-CM | POA: Insufficient documentation

## 2016-03-15 DIAGNOSIS — F329 Major depressive disorder, single episode, unspecified: Secondary | ICD-10-CM | POA: Insufficient documentation

## 2016-03-15 DIAGNOSIS — Z79899 Other long term (current) drug therapy: Secondary | ICD-10-CM | POA: Insufficient documentation

## 2016-03-15 DIAGNOSIS — N946 Dysmenorrhea, unspecified: Secondary | ICD-10-CM

## 2016-03-15 DIAGNOSIS — M1990 Unspecified osteoarthritis, unspecified site: Secondary | ICD-10-CM | POA: Diagnosis not present

## 2016-03-15 DIAGNOSIS — B9689 Other specified bacterial agents as the cause of diseases classified elsewhere: Secondary | ICD-10-CM

## 2016-03-15 HISTORY — DX: Excessive and frequent menstruation with regular cycle: N92.0

## 2016-03-15 HISTORY — DX: Dysmenorrhea, unspecified: N94.6

## 2016-03-15 LAB — URINE MICROSCOPIC-ADD ON

## 2016-03-15 LAB — CBC
HEMATOCRIT: 34.5 % — AB (ref 36.0–46.0)
HEMOGLOBIN: 11.3 g/dL — AB (ref 12.0–15.0)
MCH: 28.5 pg (ref 26.0–34.0)
MCHC: 32.8 g/dL (ref 30.0–36.0)
MCV: 87.1 fL (ref 78.0–100.0)
Platelets: 190 10*3/uL (ref 150–400)
RBC: 3.96 MIL/uL (ref 3.87–5.11)
RDW: 14.2 % (ref 11.5–15.5)
WBC: 4.6 10*3/uL (ref 4.0–10.5)

## 2016-03-15 LAB — URINALYSIS, ROUTINE W REFLEX MICROSCOPIC
BILIRUBIN URINE: NEGATIVE
GLUCOSE, UA: NEGATIVE mg/dL
Ketones, ur: NEGATIVE mg/dL
Leukocytes, UA: NEGATIVE
Nitrite: NEGATIVE
PH: 5.5 (ref 5.0–8.0)
SPECIFIC GRAVITY, URINE: 1.025 (ref 1.005–1.030)

## 2016-03-15 LAB — COMPREHENSIVE METABOLIC PANEL
ALBUMIN: 3.6 g/dL (ref 3.5–5.0)
ALK PHOS: 51 U/L (ref 38–126)
ALT: 10 U/L — AB (ref 14–54)
AST: 15 U/L (ref 15–41)
Anion gap: 6 (ref 5–15)
BILIRUBIN TOTAL: 0.2 mg/dL — AB (ref 0.3–1.2)
BUN: 10 mg/dL (ref 6–20)
CALCIUM: 8.9 mg/dL (ref 8.9–10.3)
CHLORIDE: 108 mmol/L (ref 101–111)
CO2: 26 mmol/L (ref 22–32)
Creatinine, Ser: 0.86 mg/dL (ref 0.44–1.00)
GFR calc non Af Amer: >60 mL/min (ref >60)
GLUCOSE: 94 mg/dL (ref 65–99)
Potassium: 3.7 mmol/L (ref 3.5–5.1)
Sodium: 140 mmol/L (ref 135–145)
Total Protein: 7.1 g/dL (ref 6.5–8.1)

## 2016-03-15 LAB — PREGNANCY, URINE: Preg Test, Ur: NEGATIVE

## 2016-03-15 LAB — WET PREP, GENITAL
SPERM: NONE SEEN
Trich, Wet Prep: NONE SEEN
WBC WET PREP: NONE SEEN
Yeast Wet Prep HPF POC: NONE SEEN

## 2016-03-15 LAB — LIPASE, BLOOD: Lipase: 17 U/L (ref 11–51)

## 2016-03-15 MED ORDER — METRONIDAZOLE 500 MG PO TABS: 500.0000 mg | ORAL_TABLET | Freq: Two times a day (BID) | ORAL | Status: DC

## 2016-03-15 MED ORDER — OXYCODONE-ACETAMINOPHEN 5-325 MG PO TABS
2.0000 | ORAL_TABLET | Freq: Once | ORAL | Status: AC
  Administered 2016-03-15: 2 via ORAL
  Filled 2016-03-15: qty 2

## 2016-03-15 MED ORDER — KETOROLAC TROMETHAMINE 60 MG/2ML IM SOLN
60.0000 mg | Freq: Once | INTRAMUSCULAR | Status: DC
  Filled 2016-03-15: qty 2

## 2016-03-15 MED ORDER — HYDROCODONE-ACETAMINOPHEN 5-325 MG PO TABS: ORAL_TABLET | ORAL | Status: DC

## 2016-03-15 MED ORDER — NAPROXEN 250 MG PO TABS: 250.0000 mg | ORAL_TABLET | Freq: Two times a day (BID) | ORAL | Status: DC | PRN

## 2016-03-15 MED ORDER — FENTANYL CITRATE (PF) 100 MCG/2ML IJ SOLN
50.0000 ug | Freq: Once | INTRAMUSCULAR | Status: AC
  Administered 2016-03-15: 50 ug via INTRAMUSCULAR
  Filled 2016-03-15: qty 2

## 2016-03-15 NOTE — Discharge Instructions (Signed)
Take the prescriptions as directed.  Apply moist heat to the area(s) of discomfort, for 15 minutes at a time, several times per day for the next few days.  Do not fall asleep on a heating pack.  Call your regular OB/GYN doctor tomorrow to schedule a follow up appointment in the next 3 days.  Return to the Emergency Department immediately if worsening.

## 2016-03-15 NOTE — ED Notes (Signed)
Pt reports abdominal pain and vaginal bleeding. Pt has hx of fibroids and has a cyst on RT ovary.  Denies n/v/d. Denies urinary symptoms.

## 2016-03-15 NOTE — ED Provider Notes (Signed)
CSN: [id number]     Arrival date & time 03/15/16  1359 History   First MD Initiated Contact with Patient 03/15/16 1510     Chief Complaint  Patient presents with  . Pelvic Pain    chronic      HPI Pt was seen at 1515.  Per pt, c/o gradual onset and persistence of constant acute flair of her chronic pelvic "pain" for the past several years, worse over the past 4 to 6 months.  Has been associated with vaginal bleeding.  Describes the abd pain as "cramping" and "burning." Pt has been evaluated by her OB/GYN Dr. Elonda Husky for her symptoms, "and he wanted to do a hysterectomy and I got scared and didn't do it so I didn't go back." Pt's last OB/GYN visit was 2-3 months ago.  Denies N/V, no diarrhea, no fevers, no back pain, no rash, no CP/SOB, no black or blood in stools, no dysuria/hematuria, no vaginal discharge.       OB/GYN: Dr. Elonda Husky Past Medical History  Diagnosis Date  . Depression   . Anxiety   . Hypertension   . Arthritis   . Uterine fibroid   . Chronic pelvic pain in female   . Menorrhagia   . Dysmenorrhea    Past Surgical History  Procedure Laterality Date  . Cesarean section      x 3  . Cholecystectomy    . Tonsillectomy    . Adenoidectomy    . Tubal ligation     Family History  Problem Relation Age of Onset  . Cancer Other     great grandmother-breast cancer  . Cancer Maternal Aunt     breast cancer  . Diabetes Maternal Grandmother   . Diabetes Maternal Grandfather    Social History  Substance Use Topics  . Smoking status: Current Every Day Smoker -- 0.50 packs/day for 15 years    Types: Cigarettes  . Smokeless tobacco: Never Used  . Alcohol Use: No   OB History    Gravida Para Term Preterm AB TAB SAB Ectopic Multiple Living   4 3 3  1  1   3      Review of Systems ROS: Statement: All systems negative except as marked or noted in the HPI; Constitutional: Negative for fever and chills. ; ; Eyes: Negative for eye pain, redness and discharge. ; ; ENMT: Negative  for ear pain, hoarseness, nasal congestion, sinus pressure and sore throat. ; ; Cardiovascular: Negative for chest pain, palpitations, diaphoresis, dyspnea and peripheral edema. ; ; Respiratory: Negative for cough, wheezing and stridor. ; ; Gastrointestinal: Negative for nausea, vomiting, diarrhea, abdominal pain, blood in stool, hematemesis, jaundice and rectal bleeding. . ; ; Genitourinary: Negative for dysuria, flank pain and hematuria. ; ; GYN:  +chronic pelvic pain, +vaginal bleeding, no vaginal discharge, no vulvar pain. ;; Musculoskeletal: Negative for back pain and neck pain. Negative for swelling and trauma.; ; Skin: Negative for pruritus, rash, abrasions, blisters, bruising and skin lesion.; ; Neuro: Negative for headache, lightheadedness and neck stiffness. Negative for weakness, altered level of consciousness , altered mental status, extremity weakness, paresthesias, involuntary movement, seizure and syncope.      Allergies  Shellfish allergy; Codeine; and Penicillins  Home Medications   Prior to Admission medications   Medication Sig Start Date End Date Taking? Authorizing Provider  ALPRAZolam Duanne Moron) 0.5 MG tablet Take 1 tablet (0.5 mg total) by mouth 2 (two) times daily. 05/22/13  Yes Fredia Sorrow, MD  citalopram (CELEXA) 10  MG tablet Take 10 mg by mouth daily as needed (for depression).    Yes Historical Provider, MD  ibuprofen (ADVIL,MOTRIN) 800 MG tablet Take 1 tablet (800 mg total) by mouth 3 (three) times daily. 11/17/15  Yes Noemi Chapel, MD  megestrol (MEGACE) 40 MG tablet Take 3 tablets all at once daily Patient taking differently: Take 120 mg by mouth daily.  11/26/15  Yes Florian Buff, MD  HYDROcodone-acetaminophen (NORCO/VICODIN) 5-325 MG tablet Take 1 tablet by mouth every 4 (four) hours as needed. Patient not taking: Reported on 03/15/2016 11/26/15   Florian Buff, MD   BP 99/75 mmHg  Pulse 60  Temp(Src) 98.1 F (36.7 C) (Oral)  Resp 16  Ht 5\' 3"  (1.6 m)  Wt 210 lb  (95.255 kg)  BMI 37.21 kg/m2  SpO2 100%  LMP 01/10/2016 Physical Exam  1520: Physical examination:  Nursing notes reviewed; Vital signs and O2 SAT reviewed;  Constitutional: Well developed, Well nourished, Well hydrated, In no acute distress; Head:  Normocephalic, atraumatic; Eyes: EOMI, PERRL, No scleral icterus; ENMT: Mouth and pharynx normal, Mucous membranes moist; Neck: Supple, Full range of motion, No lymphadenopathy; Cardiovascular: Regular rate and rhythm, No gallop; Respiratory: Breath sounds clear & equal bilaterally, No wheezes.  Speaking full sentences with ease, Normal respiratory effort/excursion; Chest: Nontender, Movement normal; Abdomen: Soft, Nontender, Nondistended, Normal bowel sounds; Genitourinary: No CVA tenderness. Pelvic exam performed with permission of pt and female ED tech assist during exam.  External genitalia w/o lesions. Vaginal vault with small amount dark blood, no discharge.  Cervix w/o lesions, not friable, GC/chlam and wet prep obtained and sent to lab.  Bimanual exam w/o CMT or adnexal tenderness. +suprapubic tenderness.;;; Extremities: Pulses normal, No tenderness, No edema, No calf edema or asymmetry.; Neuro: AA&Ox3, Major CN grossly intact.  Speech clear. No gross focal motor or sensory deficits in extremities.; Skin: Color normal, Warm, Dry.   ED Course  Procedures (including critical care time) Labs Review   Imaging Review  I have personally reviewed and evaluated these images and lab results as part of my medical decision-making.   EKG Interpretation None      MDM  MDM Reviewed: previous chart, nursing note and vitals Reviewed previous: ultrasound and labs Interpretation: labs     Results for orders placed or performed during the hospital encounter of 03/15/16  Wet prep, genital  Result Value Ref Range   Yeast Wet Prep HPF POC NONE SEEN NONE SEEN   Trich, Wet Prep NONE SEEN NONE SEEN   Clue Cells Wet Prep HPF POC PRESENT (A) NONE SEEN    WBC, Wet Prep HPF POC NONE SEEN NONE SEEN   Sperm NONE SEEN   Lipase, blood  Result Value Ref Range   Lipase 17 11 - 51 U/L  Comprehensive metabolic panel  Result Value Ref Range   Sodium 140 135 - 145 mmol/L   Potassium 3.7 3.5 - 5.1 mmol/L   Chloride 108 101 - 111 mmol/L   CO2 26 22 - 32 mmol/L   Glucose, Bld 94 65 - 99 mg/dL   BUN 10 6 - 20 mg/dL   Creatinine, Ser 0.86 0.44 - 1.00 mg/dL   Calcium 8.9 8.9 - 10.3 mg/dL   Total Protein 7.1 6.5 - 8.1 g/dL   Albumin 3.6 3.5 - 5.0 g/dL   AST 15 15 - 41 U/L   ALT 10 (L) 14 - 54 U/L   Alkaline Phosphatase 51 38 - 126 U/L   Total Bilirubin  0.2 (L) 0.3 - 1.2 mg/dL   GFR calc non Af Amer >60 >60 mL/min   GFR calc Af Amer >60 >60 mL/min   Anion gap 6 5 - 15  CBC  Result Value Ref Range   WBC 4.6 4.0 - 10.5 K/uL   RBC 3.96 3.87 - 5.11 MIL/uL   Hemoglobin 11.3 (L) 12.0 - 15.0 g/dL   HCT 34.5 (L) 36.0 - 46.0 %   MCV 87.1 78.0 - 100.0 fL   MCH 28.5 26.0 - 34.0 pg   MCHC 32.8 30.0 - 36.0 g/dL   RDW 14.2 11.5 - 15.5 %   Platelets 190 150 - 400 K/uL  Urinalysis, Routine w reflex microscopic (not at [facility])  Result Value Ref Range   Color, Urine AMBER (A) YELLOW   APPearance CLOUDY (A) CLEAR   Specific Gravity, Urine 1.025 1.005 - 1.030   pH 5.5 5.0 - 8.0   Glucose, UA NEGATIVE NEGATIVE mg/dL   Hgb urine dipstick LARGE (A) NEGATIVE   Bilirubin Urine NEGATIVE NEGATIVE   Ketones, ur NEGATIVE NEGATIVE mg/dL   Protein, ur TRACE (A) NEGATIVE mg/dL   Nitrite NEGATIVE NEGATIVE   Leukocytes, UA NEGATIVE NEGATIVE  Pregnancy, urine  Result Value Ref Range   Preg Test, Ur NEGATIVE NEGATIVE  Urine microscopic-add on  Result Value Ref Range   Squamous Epithelial / LPF 6-30 (A) NONE SEEN   WBC, UA 0-5 0 - 5 WBC/hpf   RBC / HPF TOO NUMEROUS TO COUNT 0 - 5 RBC/hpf   Bacteria, UA MANY (A) NONE SEEN   Urine-Other MUCOUS PRESENT     Results for WESLEE, ELLERY (MRN [id number]) as of 03/15/2016 15:39  Ref. Range 09/10/2015 19:56 11/17/2015 20:03  03/09/2016 20:23 03/15/2016 14:43  Hemoglobin Latest Ref Range: 12.0-15.0 g/dL 10.1 (L) 9.9 (L) 11.4 (L) 11.3 (L)  HCT Latest Ref Range: 36.0-46.0 % 32.9 (L) 31.6 (L) 34.7 (L) 34.5 (L)    09/2015 pelvic US by OB/GYN showed small fibroid, normal ovaries.  1700:  H/H per baseline.  Will tx for BV. Long hx of chronic pelvic pain with multiple ED visits for same.  Pt endorses acute flair of her usual long standing chronic pain today, no change from her usual chronic pain pattern.  Pt encouraged to f/u with her OB/GYN doctor for good continuity of care and control of her chronic medical condition.  Pt verb understanding. Dx and testing d/w pt and family.  Questions answered.  Verb understanding, agreeable to d/c home with outpt f/u.     Francine Graven, DO 03/18/16 [phone]

## 2016-03-16 LAB — GC/CHLAMYDIA PROBE AMP (~~LOC~~) NOT AT ARMC
CHLAMYDIA, DNA PROBE: NEGATIVE
NEISSERIA GONORRHEA: NEGATIVE

## 2016-03-17 ENCOUNTER — Encounter: Payer: Self-pay | Admitting: Obstetrics & Gynecology

## 2016-03-17 ENCOUNTER — Telehealth: Payer: Self-pay | Admitting: Obstetrics & Gynecology

## 2016-03-17 ENCOUNTER — Ambulatory Visit (INDEPENDENT_AMBULATORY_CARE_PROVIDER_SITE_OTHER): Payer: Medicare Other | Admitting: Obstetrics & Gynecology

## 2016-03-17 VITALS — BP 110/70 | HR 76 | Ht 63.0 in | Wt 213.0 lb

## 2016-03-17 DIAGNOSIS — N941 Unspecified dyspareunia: Secondary | ICD-10-CM

## 2016-03-17 DIAGNOSIS — N921 Excessive and frequent menstruation with irregular cycle: Secondary | ICD-10-CM | POA: Diagnosis not present

## 2016-03-17 DIAGNOSIS — N946 Dysmenorrhea, unspecified: Secondary | ICD-10-CM

## 2016-03-17 MED ORDER — KETOROLAC TROMETHAMINE 10 MG PO TABS: 10.0000 mg | ORAL_TABLET | Freq: Three times a day (TID) | ORAL | Status: DC | PRN

## 2016-03-17 MED ORDER — HYDROCODONE-ACETAMINOPHEN 5-325 MG PO TABS: ORAL_TABLET | ORAL | Status: DC

## 2016-03-17 NOTE — Progress Notes (Signed)
Patient ID: Rachel Ray, female   DOB: 02-Jun-1981, 35 y.o.   MRN: [id number] Preoperative History and Physical  Rachel Ray is a 35 y.o. [phone] with Patient's last menstrual period was 11/16/2015. admitted for a TAHBSO .  Patient has ongoing menometrorrhagia that is not controllable on megestrol Additionally she has bilateral pelvic pain in the adnexal area reproducible on pelvic exam and during vaginal probe Also with >50% dyspareunia with thrusting   Pt aware the surgical decision is based on addressing all of these issues  PMH:  Past Medical History  Diagnosis Date  . Depression   . Anxiety   . Hypertension   . Arthritis   . Uterine fibroid   . Chronic pelvic pain in female     PSH:  Past Surgical History  Procedure Laterality Date  . Cesarean section      x 3  . Cholecystectomy    . Tonsillectomy    . Adenoidectomy    . Tubal ligation      POb/GynH:  OB History    Gravida Para Term Preterm AB TAB SAB Ectopic Multiple Living   4 3 3  1  1   3       SH:  Social History  Substance Use Topics  . Smoking status: Current Every Day Smoker -- 0.25 packs/day for 15 years    Types: Cigarettes  . Smokeless tobacco: Never Used  . Alcohol Use: No    FH:  Family History  Problem Relation Age of Onset  . Cancer Other     great grandmother-breast cancer  . Cancer Maternal Aunt     breast cancer  . Diabetes Maternal Grandmother   . Diabetes Maternal Grandfather      Allergies:  Allergies  Allergen Reactions  . Shellfish Allergy Anaphylaxis, Shortness Of Breath and Swelling  . Codeine Other (See Comments)    Abdominal pain  . Penicillins Itching    Medications:  Current outpatient prescriptions:  . ALPRAZolam (XANAX) 0.5 MG tablet, Take 1 tablet (0.5 mg total) by mouth 2 (two) times daily., Disp: 20 tablet,  Rfl: 0 . citalopram (CELEXA) 10 MG tablet, Take 10 mg by mouth daily., Disp: , Rfl:  . HYDROcodone-acetaminophen (NORCO/VICODIN) 5-325 MG tablet, Take 1 tablet by mouth every 4 (four) hours as needed., Disp: 30 tablet, Rfl: 0 . ibuprofen (ADVIL,MOTRIN) 800 MG tablet, Take 1 tablet (800 mg total) by mouth 3 (three) times daily., Disp: 21 tablet, Rfl: 0 . megestrol (MEGACE) 40 MG tablet, Take 3 tablets all at once daily, Disp: 90 tablet, Rfl: 1 . cyclobenzaprine (FLEXERIL) 10 MG tablet, Take 1 tablet (10 mg total) by mouth 3 (three) times daily as needed for muscle spasms. (Patient not taking: Reported on 11/26/2015), Disp: 12 tablet, Rfl: 0  Review of Systems:   Review of Systems  Constitutional: Negative for fever, chills, weight loss, malaise/fatigue and diaphoresis.  HENT: Negative for hearing loss, ear pain, nosebleeds, congestion, sore throat, neck pain, tinnitus and ear discharge.  Eyes: Negative for blurred vision, double vision, photophobia, pain, discharge and redness.  Respiratory: Negative for cough, hemoptysis, sputum production, shortness of breath, wheezing and stridor.  Cardiovascular: Negative for chest pain, palpitations, orthopnea, claudication, leg swelling and PND.  Gastrointestinal: Positive for abdominal pain. Negative for heartburn, nausea, vomiting, diarrhea, constipation, blood in stool and melena.  Genitourinary: Negative for dysuria, urgency, frequency, hematuria and flank pain.  Musculoskeletal: Negative for myalgias, back pain, joint pain and falls.  Skin: Negative for itching and  rash.  Neurological: Negative for dizziness, tingling, tremors, sensory change, speech change, focal weakness, seizures, loss of consciousness, weakness and headaches.  Endo/Heme/Allergies: Negative for environmental allergies and polydipsia. Does not bruise/bleed easily.  Psychiatric/Behavioral: Negative for depression, suicidal ideas, hallucinations, memory loss and substance abuse.  The patient is not nervous/anxious and does not have insomnia.     PHYSICAL EXAM:  Blood pressure 100/60, pulse 66, weight 209 lb (94.802 kg), last menstrual period 11/16/2015.   Vitals reviewed. Constitutional: She is oriented to person, place, and time. She appears well-developed and well-nourished.  HENT:  Head: Normocephalic and atraumatic.  Right Ear: External ear normal.  Left Ear: External ear normal.  Nose: Nose normal.  Mouth/Throat: Oropharynx is clear and moist.  Eyes: Conjunctivae and EOM are normal. Pupils are equal, round, and reactive to light. Right eye exhibits no discharge. Left eye exhibits no discharge. No scleral icterus.  Neck: Normal range of motion. Neck supple. No tracheal deviation present. No thyromegaly present.  Cardiovascular: Normal rate, regular rhythm, normal heart sounds and intact distal pulses. Exam reveals no gallop and no friction rub.  No murmur heard. Respiratory: Effort normal and breath sounds normal. No respiratory distress. She has no wheezes. She has no rales. She exhibits no tenderness.  GI: Soft. Bowel sounds are normal. She exhibits no distension and no mass. There is tenderness. There is no rebound and no guarding.  Genitourinary:   Vulva is normal without lesions Vagina is pink moist without discharge Cervix normal in appearance and pap is normal Uterus is normal size, contour, position, consistency, mobility, non-tender but is on palpation Adnexa is negative tender on palpation Musculoskeletal: Normal range of motion. She exhibits no edema and no tenderness.  Neurological: She is alert and oriented to person, place, and time. She has normal reflexes. She displays normal reflexes. No cranial nerve deficit. She exhibits normal muscle tone. Coordination normal.  Skin: Skin is warm and dry. No rash noted. No erythema. No pallor.  Psychiatric: She has a normal mood and affect. Her behavior is normal. Judgment and thought  content normal.    Labs: Results for orders placed or performed during the hospital encounter of 11/17/15 (from the past 336 hour(s))  Pregnancy, urine   Collection Time: 11/17/15 7:54 PM  Result Value Ref Range   Preg Test, Ur NEGATIVE NEGATIVE  Urinalysis, Routine w reflex microscopic (not at [facility]   Collection Time: 11/17/15 7:54 PM  Result Value Ref Range   Color, Urine RED (A) YELLOW   APPearance CLOUDY (A) CLEAR   Specific Gravity, Urine 1.020 1.005 - 1.030   pH 7.0 5.0 - 8.0   Glucose, UA NEGATIVE NEGATIVE mg/dL   Hgb urine dipstick LARGE (A) NEGATIVE   Bilirubin Urine SMALL (A) NEGATIVE   Ketones, ur TRACE (A) NEGATIVE mg/dL   Protein, ur 100 (A) NEGATIVE mg/dL   Nitrite NEGATIVE NEGATIVE   Leukocytes, UA NEGATIVE NEGATIVE  Urine microscopic-add on   Collection Time: 11/17/15 7:54 PM  Result Value Ref Range   Squamous Epithelial / LPF 0-5 (A) NONE SEEN   WBC, UA 6-30 0 - 5 WBC/hpf   RBC / HPF TOO NUMEROUS TO COUNT 0 - 5 RBC/hpf   Bacteria, UA RARE (A) NONE SEEN  CBC with Differential/Platelet   Collection Time: 11/17/15 8:03 PM  Result Value Ref Range   WBC 5.1 4.0 - 10.5 K/uL   RBC 3.74 (L) 3.87 - 5.11 MIL/uL   Hemoglobin 9.9 (L) 12.0 - 15.0 g/dL  HCT 31.6 (L) 36.0 - 46.0 %   MCV 84.5 78.0 - 100.0 fL   MCH 26.5 26.0 - 34.0 pg   MCHC 31.3 30.0 - 36.0 g/dL   RDW 15.4 11.5 - 15.5 %   Platelets 219 150 - 400 K/uL   Neutrophils Relative % 48 %   Neutro Abs 2.4 1.7 - 7.7 K/uL   Lymphocytes Relative 44 %   Lymphs Abs 2.2 0.7 - 4.0 K/uL   Monocytes Relative 7 %   Monocytes Absolute 0.3 0.1 - 1.0 K/uL   Eosinophils Relative 1 %   Eosinophils Absolute 0.1 0.0 - 0.7 K/uL   Basophils Relative 0 %   Basophils Absolute 0.0 0.0 - 0.1 K/uL  Basic metabolic panel   Collection Time: 11/17/15 8:03 PM   Result Value Ref Range   Sodium 139 135 - 145 mmol/L   Potassium 3.8 3.5 - 5.1 mmol/L   Chloride 108 101 - 111 mmol/L   CO2 23 22 - 32 mmol/L   Glucose, Bld 84 65 - 99 mg/dL   BUN 11 6 - 20 mg/dL   Creatinine, Ser 0.92 0.44 - 1.00 mg/dL   Calcium 9.0 8.9 - 10.3 mg/dL   GFR calc non Af Amer >60 >60 mL/min   GFR calc Af Amer >60 >60 mL/min   Anion gap 8 5 - 15  Type and screen   Collection Time: 11/17/15 8:03 PM  Result Value Ref Range   ABO/RH(D) O POS    Antibody Screen NEG    Sample Expiration 11/20/2015     EKG: No orders found for this or any previous visit.  Imaging Studies: GYNECOLOGIC SONOGRAM   Rachel Ray is a 35 y.o. [id number] LMP 10/09/2015 for a pelvic sonogram for menometrorrhagia and fibroids.  Uterus 9.2 x 5.2 x 6.4 cm, heterogenous anteverted uterus w/ a 2.3 x 1.7 x 2.3cm ant.left subserosal fibroid  Endometrium 9.5 mm, symmetrical, wnl  Right ovary 2.2 x 1.3 x 2.1 cm, wnl  Left ovary 3.2 x 1.7 x 3.0 cm, wnl   Technician Comments:  PELVIC US TA/TV: heterogenous anteverted uterus w/ a 2.3 x 1.7 x 2.3cm ant.left subserosal fibroid,EEC 9.5 mm,normal ov's bilat (mobile),no free fluid seen    [employer] 10/15/2015 3:10 PM  Clinical Impression and recommendations:  I have reviewed the sonogram results above, combined with the patient's current clinical course, below are my impressions and any appropriate recommendations for management based on the sonographic findings.  Normal size uterus with small subserosal fibroid that is most likely clinically irrelevant Ovaries are normal No other abnormalities   Naif Alabi H 10/15/2015 3:55 PM    Assessment: Menometrorrhagia  Dysmenorrhea  Dyspareunia, female  Anemia   Plan: TAHBSO 03/25/2016  Pt understands the risks of surgery including but not limited t excessive  bleeding requiring transfusion or reoperation, post-operative infection requiring prolonged hospitalization or re-hospitalization and antibiotic therapy, and damage to other organs including bladder, bowel, ureters and major vessels. The patient also understands the alternative treatment options which were discussed in full. All questions were answered.

## 2016-03-17 NOTE — Telephone Encounter (Signed)
Pt states seen at ED on 03/15/2016 for abdominal pain and fibroids, was given pain meds but  not relieving her pain. Pt also states was scheduled for a hysterectomy in the past with Dr. Elonda Husky but did not have done due to lack of family support. Pt given an appt today with Dr. Elonda Husky for evaluation.

## 2016-03-18 ENCOUNTER — Ambulatory Visit: Payer: Medicare Other | Admitting: Obstetrics & Gynecology

## 2016-03-18 NOTE — Patient Instructions (Signed)
Bilma Rahill Daughety  03/18/2016     @PREFPERIOPPHARMACY @   Your procedure is scheduled on  03/25/2016   Report to [name] at  615  A.M.  Call this number if you have problems the morning of surgery:  [phone]   Remember:  Do not eat food or drink liquids after midnight.  Take these medicines the morning of surgery with A SIP OF WATER  Xanax, celexa, hydrocodone, toradol.   Do not wear jewelry, make-up or nail polish.  Do not wear lotions, powders, or perfumes.  You may wear deodorant.  Do not shave 48 hours prior to surgery.  Men may shave face and neck.  Do not bring valuables to the hospital.  [facility] is not responsible for any belongings or valuables.  Contacts, dentures or bridgework may not be worn into surgery.  Leave your suitcase in the car.  After surgery it may be brought to your room.  For patients admitted to the hospital, discharge time will be determined by your treatment team.  Patients discharged the day of surgery will not be allowed to drive home.   Name and phone number of your driver:   family Special instructions:  none  Please read over the following fact sheets that you were given. Coughing and Deep Breathing, Blood Transfusion Information, MRSA Information, Surgical Site Infection Prevention, Anesthesia Post-op Instructions and Care and Recovery After Surgery      Abdominal Hysterectomy Abdominal hysterectomy is a surgical procedure to remove your womb (uterus). Your uterus is the muscular organ that contains a developing baby. This surgery is done for many reasons. You may need an abdominal hysterectomy if you have cancer, growths (tumors), long-term pain, or bleeding. You may also have this procedure if your uterus has slipped down into your vagina (uterine prolapse). Depending on why you need an abdominal hysterectomy, you may also have other reproductive organs removed. These could include the part of your vagina that  connects with your uterus (cervix), the organs that make eggs (ovaries), and the tubes that connect the ovaries to the uterus (fallopian tubes). LET [facility] CARE PROVIDER KNOW ABOUT:   Any allergies you have.  All medicines you are taking, including vitamins, herbs, eye drops, creams, and over-the-counter medicines.  Previous problems you or members of your family have had with the use of anesthetics.  Any blood disorders you have.  Previous surgeries you have had.  Medical conditions you have. RISKS AND COMPLICATIONS Generally, this is a safe procedure. However, as with any procedure, problems can occur. Infection is the most common problem after an abdominal hysterectomy. Other possible problems include:  Bleeding.  Formation of blood clots that may break free and travel to your lungs.  Injury to other organs near your uterus.  Nerve injury causing nerve pain.  Decreased interest in sex or pain during sexual intercourse. BEFORE THE PROCEDURE  Abdominal hysterectomy is a major surgical procedure. It can affect the way you feel about yourself. Talk to your health care provider about the physical and emotional changes hysterectomy may cause.  You may need to have blood work and X-rays done before surgery.  Quit smoking if you smoke. Ask your health care provider for help if you are struggling to quit.  Stop taking medicines that thin your blood as directed by your health care provider.  You may be instructed to take antibiotic medicines or laxatives before surgery.  Do  not eat or drink anything for 6-8 hours before surgery.  Take your regular medicines with a small sip of water.  Bathe or shower the night or morning before surgery. PROCEDURE  Abdominal hysterectomy is done in the operating room at the hospital.  In most cases, you will be given a medicine that makes you go to sleep (general anesthetic).  The surgeon will make a cut (incision) through the skin in  your lower belly.  The incision may be about 5-7 inches long. It may go side-to-side or up-and-down.  The surgeon will move aside the body tissue that covers your uterus. The surgeon will then carefully take out your uterus along with any of your other reproductive organs that need to be removed.  Bleeding will be controlled with clamps or sutures.  The surgeon will close your incision with sutures or metal clips. AFTER THE PROCEDURE  You will have some pain immediately after the procedure.  You will be given pain medicine in the recovery room.  You will be taken to your hospital room when you have recovered from the anesthesia.  You may need to stay in the hospital for 2-5 days.  You will be given instructions for recovery at home.   This information is not intended to replace advice given to you by your health care provider. Make sure you discuss any questions you have with your health care provider.   Document Released: 11/14/2013 Document Reviewed: 11/14/2013 Elsevier Interactive Patient Education 2016 [place]. Abdominal Hysterectomy, Care After These instructions give you information on caring for yourself after your procedure. Your doctor may also give you more specific instructions. Call your doctor if you have any problems or questions after your procedure.  HOME CARE It takes 4-6 weeks to recover from this surgery. Follow all of your doctor's instructions.   Only take medicines as told by your doctor.  Change your bandage as told by your doctor.  Return to your doctor to have your stitches taken out.  Take showers for 2-3 weeks. Ask your doctor when it is okay to shower.  Do not douche, use tampons, or have sex (intercourse) for at least 6 weeks or as told.  Follow your doctor's advice about exercise, lifting objects, driving, and general activities.  Get plenty of rest and sleep.  Do not lift anything heavier than a gallon of milk (about 10 pounds [4.5  kilograms]) for the first month after surgery.  Get back to your normal diet as told by your doctor.  Do not drink alcohol until your doctor says it is okay.  Take a medicine to help you poop (laxative) as told by your doctor.  Eating foods high in fiber may help you poop. Eat a lot of raw fruits and vegetables, whole grains, and beans.  Drink enough fluids to keep your pee (urine) clear or pale yellow.  Have someone help you at home for 1-2 weeks after your surgery.  Keep follow-up doctor visits as told. GET HELP IF:  You have chills or fever.  You have puffiness, redness, or pain in area of the cut (incision).  You have yellowish-white fluid (pus) coming from the cut.  You have a bad smell coming from the cut or bandage.  Your cut pulls apart.  You feel dizzy or light-headed.  You have pain or bleeding when you pee.  You keep having watery poop (diarrhea).  You keep feeling sick to your stomach (nauseous) or keep throwing up (vomiting).  You have  fluid (discharge) coming from your vagina.  You have a rash.  You have a reaction to your medicine.  You need stronger pain medicine. GET HELP RIGHT AWAY IF:   You have a fever and your symptoms suddenly get worse.  You have bad belly (abdominal) pain.  You have chest pain.  You are short of breath.  You pass out (faint).  You have pain, puffiness, or redness of your leg.  You bleed a lot from your vagina and notice clumps of tissue (clots). MAKE SURE YOU:   Understand these instructions.  Will watch your condition.  Will get help right away if you are not doing well or get worse.   This information is not intended to replace advice given to you by your health care provider. Make sure you discuss any questions you have with your health care provider.   Document Released: 08/18/2008 Document Revised: 11/14/2013 Document Reviewed: 09/01/2013 Elsevier Interactive Patient Education 2016 [employer] Bilateral Salpingo-Oophorectomy Bilateral salpingo-oophorectomy is the surgical removal of both fallopian tubes and both ovaries. The ovaries are small organs that produce eggs in women. The fallopian tubes transport the egg from the ovary to the womb (uterus). Usually, when this surgery is done, the uterus was previously removed. A bilateral salpingo-oophorectomy may be done to treat cancer or to reduce the risk of cancer in women who are at high risk. Removing both fallopian tubes and both ovaries will make you unable to become pregnant (sterile). It will also put you into menopause so that you will no longer have menstrual periods and may have menopausal symptoms such as hot flashes, night sweats, and mood changes. It will not affect your sex drive. LET [facility] CARE PROVIDER KNOW ABOUT:  Any allergies you have.  All medicines you are taking, including vitamins, herbs, eye drops, creams, and over-the-counter medicines.  Previous problems you or members of your family have had with the use of anesthetics.  Any blood disorders you have.  Previous surgeries you have had.  Medical conditions you have. RISKS AND COMPLICATIONS Generally, this is a safe procedure. However, as with any procedure, complications can occur. Possible complications include:  Injury to surrounding organs.  Bleeding.  Infection.  Blood clots in the legs or lungs.  Problems related to anesthesia. BEFORE THE PROCEDURE  Ask your health care provider about changing or stopping your regular medicines. You may need to stop taking certain medicines, such as aspirin or blood thinners, at least 1 week before the surgery.  Do not eat or drink anything for at least 8 hours before the surgery.  If you smoke, do not smoke for at least 2 weeks before the surgery.  Make plans to have someone drive you home after the procedure or after your hospital stay. Also arrange for someone to help you with activities during  recovery. PROCEDURE   You will be given medicine to help you relax before the procedure (sedative). You will then be given medicine to make you sleep through the procedure (general anesthetic). These medicines will be given through an IV access tube that is put into one of your veins.  Once you are asleep, your lower abdomen will be shaved and cleaned. A thin, flexible tube (catheter) will be placed in your bladder.  The surgeon may use a laparoscopic, robotic, or open technique for this surgery:  In the laparoscopic technique, the surgery is done through two small cuts (incisions) in the abdomen. A thin, lighted tube with a tiny  camera on the end (laparoscope) is inserted into one of the incisions. The tools needed for the procedure are put through the other incision.  A robotic technique may be chosen to perform complex surgery in a small space. In the robotic technique, small incisions will be made. A camera and surgical instruments are passed through the incisions. Surgical instruments will be controlled with the help of a robotic arm.  In the open technique, the surgery is done through one large incision in the abdomen.  Using any of these techniques, the surgeon removes the fallopian tubes and ovaries. The blood vessels will be clamped and tied.  The surgeon then uses staples or stitches to close the incision or incisions. AFTER THE PROCEDURE  You will be taken to a recovery area where you will be monitored for 1 to 3 hours. Your blood pressure, pulse, and temperature will be checked often. You will remain in the recovery area until you are stable and waking up.  If the laparoscopic technique was used, you may be allowed to go home after several hours. You may have some shoulder pain after the laparoscopic procedure. This is normal and usually goes away in a day or two.  If the open technique was used, you will be admitted to the hospital for a couple of days.  You will be given pain  medicine as needed.  The IV access tube and catheter will be removed before you are discharged.   This information is not intended to replace advice given to you by your health care provider. Make sure you discuss any questions you have with your health care provider.   Document Released: 11/09/2005 Document Revised: 11/14/2013 Document Reviewed: 05/03/2013 Elsevier Interactive Patient Education 2016 [employer] Bilateral Salpingo-Oophorectomy, Care After Refer to this sheet in the next few weeks. These instructions provide you with information on caring for yourself after your procedure. Your health care provider may also give you more specific instructions. Your treatment has been planned according to current medical practices, but problems sometimes occur. Call your health care provider if you have any problems or questions after your procedure. WHAT TO EXPECT AFTER THE PROCEDURE After your procedure, it is typical to have the following:   Abdominal pain that can be controlled with medicine.  Vaginal spotting.  Constipation.  Menopausal symptoms such as hot flashes, vaginal dryness, and mood swings. HOME CARE INSTRUCTIONS   Get plenty of rest and sleep.  Only take over-the-counter or prescription medicines as directed by your health care provider. Do not take aspirin. It can cause bleeding.  Keep incision areas clean and dry. Remove or change bandages (dressings) only as directed by your health care provider.  Take showers instead of baths for a few weeks as directed by your health care provider.  Limit exercise and activities as directed by your health care provider. Do not lift anything heavier than 5 pounds (2.3 kg) until your health care provider approves.  Do not drive until your health care provider approves.  Follow your health care provider's advice regarding diet. You may be able to resume your usual diet right away.  Drink enough fluids to keep your urine clear or  pale yellow.  Do not douche, use tampons, or have sexual intercourse for 6 weeks after the procedure.  Do not drink alcohol until your health care provider says it is okay.  Take your temperature twice a day and write it down.  If you become constipated, you may:  Ask your  health care provider about taking a mild laxative.  Add more fruit and bran to your diet.  Drink more fluids.  Follow up with your health care provider as directed. SEEK MEDICAL CARE IF:   You have swelling, redness, or increasing pain in the incision area.  You see pus coming from the incision area.  You notice a bad smell coming from the wound or dressing.  You have pain, redness, or swelling where the IV access tube was placed.  Your incision is breaking open (the edges are not staying together).  You feel dizzy or feel like fainting.  You develop pain or bleeding when you urinate.  You develop diarrhea.  You develop nausea and vomiting.  You develop abnormal vaginal discharge.  You develop a rash.  You have pain that is not controlled with medicine. SEEK IMMEDIATE MEDICAL CARE IF:   You develop a fever.  You develop abdominal pain.  You have chest pain.  You develop shortness of breath.  You pass out.  You develop pain, swelling, or redness in your leg.  You develop heavy vaginal bleeding with or without blood clots.   This information is not intended to replace advice given to you by your health care provider. Make sure you discuss any questions you have with your health care provider.   Document Released: 11/09/2005 Document Revised: 07/12/2013 Document Reviewed: 05/03/2013 Elsevier Interactive Patient Education 2016 [employer] PATIENT INSTRUCTIONS POST-ANESTHESIA  IMMEDIATELY FOLLOWING SURGERY:  Do not drive or operate machinery for the first twenty four hours after surgery.  Do not make any important decisions for twenty four hours after surgery or while taking narcotic  pain medications or sedatives.  If you develop intractable nausea and vomiting or a severe headache please notify your doctor immediately.  FOLLOW-UP:  Please make an appointment with your surgeon as instructed. You do not need to follow up with anesthesia unless specifically instructed to do so.  WOUND CARE INSTRUCTIONS (if applicable):  Keep a dry clean dressing on the anesthesia/puncture wound site if there is drainage.  Once the wound has quit draining you may leave it open to air.  Generally you should leave the bandage intact for twenty four hours unless there is drainage.  If the epidural site drains for more than 36-48 hours please call the anesthesia department.  QUESTIONS?:  Please feel free to call your physician or the hospital operator if you have any questions, and they will be happy to assist you.

## 2016-03-19 ENCOUNTER — Encounter (HOSPITAL_COMMUNITY)
Admission: RE | Admit: 2016-03-19 | Discharge: 2016-03-19 | Disposition: A | Discharge status: Home / Self Care | Payer: Medicare Other | Source: Ambulatory Visit | Attending: Obstetrics & Gynecology | Admitting: Obstetrics & Gynecology

## 2016-03-20 ENCOUNTER — Other Ambulatory Visit: Payer: Self-pay | Admitting: Obstetrics & Gynecology

## 2016-03-20 ENCOUNTER — Encounter (HOSPITAL_COMMUNITY): Admission: RE | Admit: 2016-03-20 | Payer: Medicare Other | Source: Ambulatory Visit

## 2016-03-23 ENCOUNTER — Other Ambulatory Visit: Payer: Self-pay

## 2016-03-23 ENCOUNTER — Encounter (HOSPITAL_COMMUNITY)
Admission: RE | Admit: 2016-03-23 | Discharge: 2016-03-23 | Disposition: A | Discharge status: Home / Self Care | Payer: Medicare Other | Source: Ambulatory Visit | Attending: Obstetrics & Gynecology | Admitting: Obstetrics & Gynecology

## 2016-03-23 ENCOUNTER — Encounter (HOSPITAL_COMMUNITY): Payer: Self-pay

## 2016-03-23 DIAGNOSIS — D649 Anemia, unspecified: Secondary | ICD-10-CM | POA: Diagnosis not present

## 2016-03-23 DIAGNOSIS — R102 Pelvic and perineal pain: Secondary | ICD-10-CM | POA: Diagnosis not present

## 2016-03-23 DIAGNOSIS — D252 Subserosal leiomyoma of uterus: Secondary | ICD-10-CM | POA: Diagnosis not present

## 2016-03-23 DIAGNOSIS — N941 Unspecified dyspareunia: Secondary | ICD-10-CM | POA: Diagnosis not present

## 2016-03-23 DIAGNOSIS — F419 Anxiety disorder, unspecified: Secondary | ICD-10-CM | POA: Diagnosis not present

## 2016-03-23 DIAGNOSIS — M1990 Unspecified osteoarthritis, unspecified site: Secondary | ICD-10-CM | POA: Diagnosis not present

## 2016-03-23 DIAGNOSIS — I10 Essential (primary) hypertension: Secondary | ICD-10-CM | POA: Diagnosis not present

## 2016-03-23 DIAGNOSIS — N921 Excessive and frequent menstruation with irregular cycle: Secondary | ICD-10-CM | POA: Diagnosis present

## 2016-03-23 DIAGNOSIS — N946 Dysmenorrhea, unspecified: Secondary | ICD-10-CM | POA: Diagnosis not present

## 2016-03-23 DIAGNOSIS — F17210 Nicotine dependence, cigarettes, uncomplicated: Secondary | ICD-10-CM | POA: Diagnosis not present

## 2016-03-23 DIAGNOSIS — G8929 Other chronic pain: Secondary | ICD-10-CM | POA: Diagnosis not present

## 2016-03-23 DIAGNOSIS — F329 Major depressive disorder, single episode, unspecified: Secondary | ICD-10-CM | POA: Diagnosis not present

## 2016-03-23 HISTORY — DX: Anemia, unspecified: D64.9

## 2016-03-23 LAB — CBC
HEMATOCRIT: 34.2 % — AB (ref 36.0–46.0)
Hemoglobin: 11.2 g/dL — ABNORMAL LOW (ref 12.0–15.0)
MCH: 28.6 pg (ref 26.0–34.0)
MCHC: 32.7 g/dL (ref 30.0–36.0)
MCV: 87.5 fL (ref 78.0–100.0)
PLATELETS: 218 10*3/uL (ref 150–400)
RBC: 3.91 MIL/uL (ref 3.87–5.11)
RDW: 14.1 % (ref 11.5–15.5)
WBC: 4.7 10*3/uL (ref 4.0–10.5)

## 2016-03-23 LAB — TYPE AND SCREEN
ABO/RH(D): O POS
Antibody Screen: NEGATIVE

## 2016-03-23 LAB — URINALYSIS, ROUTINE W REFLEX MICROSCOPIC
Bilirubin Urine: NEGATIVE
GLUCOSE, UA: NEGATIVE mg/dL
Hgb urine dipstick: NEGATIVE
KETONES UR: NEGATIVE mg/dL
LEUKOCYTES UA: NEGATIVE
NITRITE: NEGATIVE
PROTEIN: NEGATIVE mg/dL
Specific Gravity, Urine: >1.03 — ABNORMAL HIGH (ref 1.005–1.030)
pH: 6 (ref 5.0–8.0)

## 2016-03-23 LAB — COMPREHENSIVE METABOLIC PANEL
ALT: 9 U/L — ABNORMAL LOW (ref 14–54)
AST: 15 U/L (ref 15–41)
Albumin: 3.5 g/dL (ref 3.5–5.0)
Alkaline Phosphatase: 49 U/L (ref 38–126)
Anion gap: 7 (ref 5–15)
BILIRUBIN TOTAL: 0.2 mg/dL — AB (ref 0.3–1.2)
BUN: 11 mg/dL (ref 6–20)
CHLORIDE: 111 mmol/L (ref 101–111)
CO2: 23 mmol/L (ref 22–32)
Calcium: 8.8 mg/dL — ABNORMAL LOW (ref 8.9–10.3)
Creatinine, Ser: 0.75 mg/dL (ref 0.44–1.00)
Glucose, Bld: 95 mg/dL (ref 65–99)
POTASSIUM: 4.1 mmol/L (ref 3.5–5.1)
Sodium: 141 mmol/L (ref 135–145)
TOTAL PROTEIN: 6.8 g/dL (ref 6.5–8.1)

## 2016-03-23 LAB — HCG, QUANTITATIVE, PREGNANCY

## 2016-03-23 NOTE — Pre-Procedure Instructions (Signed)
Patient given information to sign up for my chart at home. 

## 2016-03-25 ENCOUNTER — Encounter (HOSPITAL_COMMUNITY): Payer: Self-pay | Admitting: *Deleted

## 2016-03-25 ENCOUNTER — Ambulatory Visit (HOSPITAL_COMMUNITY)
Admission: RE | Admit: 2016-03-25 | Discharge: 2016-03-26 | Disposition: A | Discharge status: Home / Self Care | Payer: Medicare Other | Source: Ambulatory Visit | Attending: Obstetrics & Gynecology | Admitting: Obstetrics & Gynecology

## 2016-03-25 ENCOUNTER — Inpatient Hospital Stay (HOSPITAL_COMMUNITY): Payer: Medicare Other | Admitting: Anesthesiology

## 2016-03-25 ENCOUNTER — Encounter (HOSPITAL_COMMUNITY)
Admission: RE | Disposition: A | Discharge status: Home / Self Care | Payer: Self-pay | Source: Ambulatory Visit | Attending: Obstetrics & Gynecology

## 2016-03-25 DIAGNOSIS — F17210 Nicotine dependence, cigarettes, uncomplicated: Secondary | ICD-10-CM | POA: Insufficient documentation

## 2016-03-25 DIAGNOSIS — D251 Intramural leiomyoma of uterus: Secondary | ICD-10-CM | POA: Diagnosis not present

## 2016-03-25 DIAGNOSIS — G8929 Other chronic pain: Secondary | ICD-10-CM | POA: Insufficient documentation

## 2016-03-25 DIAGNOSIS — N941 Unspecified dyspareunia: Secondary | ICD-10-CM | POA: Insufficient documentation

## 2016-03-25 DIAGNOSIS — R102 Pelvic and perineal pain: Secondary | ICD-10-CM | POA: Diagnosis not present

## 2016-03-25 DIAGNOSIS — N921 Excessive and frequent menstruation with irregular cycle: Secondary | ICD-10-CM | POA: Diagnosis not present

## 2016-03-25 DIAGNOSIS — N946 Dysmenorrhea, unspecified: Secondary | ICD-10-CM | POA: Insufficient documentation

## 2016-03-25 DIAGNOSIS — M1990 Unspecified osteoarthritis, unspecified site: Secondary | ICD-10-CM | POA: Insufficient documentation

## 2016-03-25 DIAGNOSIS — D500 Iron deficiency anemia secondary to blood loss (chronic): Secondary | ICD-10-CM

## 2016-03-25 DIAGNOSIS — D649 Anemia, unspecified: Secondary | ICD-10-CM | POA: Insufficient documentation

## 2016-03-25 DIAGNOSIS — N920 Excessive and frequent menstruation with regular cycle: Secondary | ICD-10-CM

## 2016-03-25 DIAGNOSIS — F419 Anxiety disorder, unspecified: Secondary | ICD-10-CM | POA: Insufficient documentation

## 2016-03-25 DIAGNOSIS — Z90710 Acquired absence of both cervix and uterus: Secondary | ICD-10-CM | POA: Diagnosis present

## 2016-03-25 DIAGNOSIS — I10 Essential (primary) hypertension: Secondary | ICD-10-CM | POA: Insufficient documentation

## 2016-03-25 DIAGNOSIS — D252 Subserosal leiomyoma of uterus: Secondary | ICD-10-CM | POA: Diagnosis not present

## 2016-03-25 DIAGNOSIS — F329 Major depressive disorder, single episode, unspecified: Secondary | ICD-10-CM | POA: Insufficient documentation

## 2016-03-25 HISTORY — PX: ABDOMINAL HYSTERECTOMY: SHX81

## 2016-03-25 HISTORY — PX: SALPINGOOPHORECTOMY: SHX82

## 2016-03-25 SURGERY — HYSTERECTOMY, ABDOMINAL
Anesthesia: General

## 2016-03-25 MED ORDER — GLYCOPYRROLATE 0.2 MG/ML IJ SOLN
INTRAMUSCULAR | Status: AC
  Filled 2016-03-25: qty 1

## 2016-03-25 MED ORDER — ONDANSETRON HCL 4 MG/2ML IJ SOLN
INTRAMUSCULAR | Status: AC
  Filled 2016-03-25: qty 2

## 2016-03-25 MED ORDER — HYDROMORPHONE HCL 1 MG/ML IJ SOLN
INTRAMUSCULAR | Status: AC
  Filled 2016-03-25: qty 1

## 2016-03-25 MED ORDER — CITALOPRAM HYDROBROMIDE 10 MG PO TABS
10.0000 mg | ORAL_TABLET | Freq: Every day | ORAL | Status: DC | PRN
  Administered 2016-03-26: 10 mg via ORAL
  Filled 2016-03-25 (×2): qty 1

## 2016-03-25 MED ORDER — TRAZODONE HCL 50 MG PO TABS
50.0000 mg | ORAL_TABLET | Freq: Every day | ORAL | Status: DC
  Administered 2016-03-25: 50 mg via ORAL
  Filled 2016-03-25: qty 1

## 2016-03-25 MED ORDER — SODIUM CHLORIDE 0.9 % IR SOLN
Status: DC | PRN
  Administered 2016-03-25: 2000 mL
  Administered 2016-03-25: 1000 mL

## 2016-03-25 MED ORDER — FENTANYL CITRATE (PF) 100 MCG/2ML IJ SOLN
INTRAMUSCULAR | Status: AC
  Filled 2016-03-25: qty 2

## 2016-03-25 MED ORDER — OXYCODONE-ACETAMINOPHEN 5-325 MG PO TABS
1.0000 | ORAL_TABLET | ORAL | Status: DC | PRN
  Administered 2016-03-25 – 2016-03-26 (×4): 2 via ORAL
  Filled 2016-03-25 (×4): qty 2

## 2016-03-25 MED ORDER — LACTATED RINGERS IV SOLN
INTRAVENOUS | Status: DC
  Administered 2016-03-25: 10:00:00 via INTRAVENOUS
  Administered 2016-03-25: 1000 mL via INTRAVENOUS
  Administered 2016-03-25: 12:00:00 via INTRAVENOUS

## 2016-03-25 MED ORDER — ZOLPIDEM TARTRATE 5 MG PO TABS: 5.0000 mg | ORAL_TABLET | Freq: Every evening | ORAL | Status: DC | PRN

## 2016-03-25 MED ORDER — PROPOFOL 10 MG/ML IV BOLUS
INTRAVENOUS | Status: DC | PRN
  Administered 2016-03-25: 150 mg via INTRAVENOUS

## 2016-03-25 MED ORDER — ONDANSETRON HCL 4 MG/2ML IJ SOLN
4.0000 mg | Freq: Once | INTRAMUSCULAR | Status: AC
  Administered 2016-03-25: 4 mg via INTRAVENOUS

## 2016-03-25 MED ORDER — ROCURONIUM BROMIDE 50 MG/5ML IV SOLN
INTRAVENOUS | Status: AC
  Filled 2016-03-25: qty 1

## 2016-03-25 MED ORDER — MIDAZOLAM HCL 2 MG/2ML IJ SOLN
1.0000 mg | INTRAMUSCULAR | Status: DC | PRN
  Administered 2016-03-25 (×2): 2 mg via INTRAVENOUS

## 2016-03-25 MED ORDER — BUPIVACAINE LIPOSOME 1.3 % IJ SUSP
INTRAMUSCULAR | Status: DC | PRN
  Administered 2016-03-25: 20 mL

## 2016-03-25 MED ORDER — KETOROLAC TROMETHAMINE 30 MG/ML IJ SOLN
INTRAMUSCULAR | Status: AC
  Filled 2016-03-25: qty 1

## 2016-03-25 MED ORDER — FENTANYL CITRATE (PF) 250 MCG/5ML IJ SOLN
INTRAMUSCULAR | Status: DC | PRN
  Administered 2016-03-25 (×7): 50 ug via INTRAVENOUS

## 2016-03-25 MED ORDER — CEFAZOLIN SODIUM-DEXTROSE 2-4 GM/100ML-% IV SOLN
INTRAVENOUS | Status: AC
  Filled 2016-03-25: qty 100

## 2016-03-25 MED ORDER — ROCURONIUM BROMIDE 100 MG/10ML IV SOLN
INTRAVENOUS | Status: DC | PRN
  Administered 2016-03-25: 5 mg via INTRAVENOUS
  Administered 2016-03-25: 35 mg via INTRAVENOUS
  Administered 2016-03-25: 10 mg via INTRAVENOUS

## 2016-03-25 MED ORDER — BUPIVACAINE LIPOSOME 1.3 % IJ SUSP: 20.0000 mL | Freq: Once | INTRAMUSCULAR | Status: DC

## 2016-03-25 MED ORDER — GLYCOPYRROLATE 0.2 MG/ML IJ SOLN
INTRAMUSCULAR | Status: DC | PRN
  Administered 2016-03-25 (×2): 0.3 mg via INTRAVENOUS

## 2016-03-25 MED ORDER — KCL IN DEXTROSE-NACL 20-5-0.45 MEQ/L-%-% IV SOLN
INTRAVENOUS | Status: DC
  Administered 2016-03-25: 15:00:00 via INTRAVENOUS

## 2016-03-25 MED ORDER — GLYCOPYRROLATE 0.2 MG/ML IJ SOLN
INTRAMUSCULAR | Status: AC
  Filled 2016-03-25: qty 3

## 2016-03-25 MED ORDER — BUPIVACAINE LIPOSOME 1.3 % IJ SUSP
INTRAMUSCULAR | Status: AC
  Filled 2016-03-25: qty 20

## 2016-03-25 MED ORDER — ALUM & MAG HYDROXIDE-SIMETH 200-200-20 MG/5ML PO SUSP: 30.0000 mL | ORAL | Status: DC | PRN

## 2016-03-25 MED ORDER — MIDAZOLAM HCL 2 MG/2ML IJ SOLN
INTRAMUSCULAR | Status: AC
  Filled 2016-03-25: qty 2

## 2016-03-25 MED ORDER — SODIUM CHLORIDE 0.9 % IV SOLN
8.0000 mg | Freq: Four times a day (QID) | INTRAVENOUS | Status: DC | PRN
  Administered 2016-03-25: 8 mg via INTRAVENOUS
  Filled 2016-03-25 (×2): qty 4

## 2016-03-25 MED ORDER — ONDANSETRON HCL 4 MG/2ML IJ SOLN: 4.0000 mg | Freq: Once | INTRAMUSCULAR | Status: DC | PRN

## 2016-03-25 MED ORDER — CEFAZOLIN SODIUM-DEXTROSE 2-4 GM/100ML-% IV SOLN
2.0000 g | INTRAVENOUS | Status: AC
  Administered 2016-03-25: 2 g via INTRAVENOUS

## 2016-03-25 MED ORDER — DOCUSATE SODIUM 100 MG PO CAPS
100.0000 mg | ORAL_CAPSULE | Freq: Two times a day (BID) | ORAL | Status: DC
  Administered 2016-03-25 – 2016-03-26 (×2): 100 mg via ORAL
  Filled 2016-03-25 (×2): qty 1

## 2016-03-25 MED ORDER — PROPOFOL 10 MG/ML IV BOLUS
INTRAVENOUS | Status: AC
  Filled 2016-03-25: qty 20

## 2016-03-25 MED ORDER — NEOSTIGMINE METHYLSULFATE 10 MG/10ML IV SOLN
INTRAVENOUS | Status: AC
  Filled 2016-03-25: qty 1

## 2016-03-25 MED ORDER — NEOSTIGMINE METHYLSULFATE 10 MG/10ML IV SOLN
INTRAVENOUS | Status: DC | PRN
  Administered 2016-03-25 (×2): 2 mg via INTRAVENOUS

## 2016-03-25 MED ORDER — PROMETHAZINE HCL 25 MG/ML IJ SOLN
12.5000 mg | Freq: Once | INTRAMUSCULAR | Status: AC
  Administered 2016-03-25: 12.5 mg via INTRAVENOUS

## 2016-03-25 MED ORDER — LIDOCAINE HCL 1 % IJ SOLN
INTRAMUSCULAR | Status: DC | PRN
  Administered 2016-03-25: 50 mg via INTRADERMAL

## 2016-03-25 MED ORDER — PROMETHAZINE HCL 25 MG/ML IJ SOLN
INTRAMUSCULAR | Status: AC
  Filled 2016-03-25: qty 1

## 2016-03-25 MED ORDER — LIDOCAINE HCL (PF) 1 % IJ SOLN
INTRAMUSCULAR | Status: AC
  Filled 2016-03-25: qty 5

## 2016-03-25 MED ORDER — HYDROMORPHONE HCL 1 MG/ML IJ SOLN
1.0000 mg | INTRAMUSCULAR | Status: DC | PRN
  Administered 2016-03-25 (×3): 2 mg via INTRAVENOUS
  Administered 2016-03-26: 1 mg via INTRAVENOUS
  Filled 2016-03-25: qty 2
  Filled 2016-03-25: qty 1
  Filled 2016-03-25 (×2): qty 2

## 2016-03-25 MED ORDER — ALPRAZOLAM 0.5 MG PO TABS
0.5000 mg | ORAL_TABLET | Freq: Two times a day (BID) | ORAL | Status: DC
  Administered 2016-03-25 – 2016-03-26 (×2): 0.5 mg via ORAL
  Filled 2016-03-25 (×2): qty 1

## 2016-03-25 MED ORDER — SENNOSIDES-DOCUSATE SODIUM 8.6-50 MG PO TABS: 1.0000 | ORAL_TABLET | Freq: Every evening | ORAL | Status: DC | PRN

## 2016-03-25 MED ORDER — SIMETHICONE 80 MG PO CHEW: 80.0000 mg | CHEWABLE_TABLET | Freq: Four times a day (QID) | ORAL | Status: DC | PRN

## 2016-03-25 MED ORDER — KETOROLAC TROMETHAMINE 30 MG/ML IJ SOLN
30.0000 mg | Freq: Once | INTRAMUSCULAR | Status: AC
  Administered 2016-03-25: 30 mg via INTRAVENOUS

## 2016-03-25 MED ORDER — SUCCINYLCHOLINE CHLORIDE 20 MG/ML IJ SOLN
INTRAMUSCULAR | Status: AC
  Filled 2016-03-25: qty 1

## 2016-03-25 MED ORDER — HYDROMORPHONE HCL 1 MG/ML IJ SOLN
0.5000 mg | INTRAMUSCULAR | Status: AC | PRN
  Administered 2016-03-25 (×4): 0.5 mg via INTRAVENOUS
  Filled 2016-03-25: qty 1

## 2016-03-25 MED ORDER — FENTANYL CITRATE (PF) 250 MCG/5ML IJ SOLN
INTRAMUSCULAR | Status: AC
  Filled 2016-03-25: qty 5

## 2016-03-25 MED ORDER — BISACODYL 10 MG RE SUPP: 10.0000 mg | Freq: Every day | RECTAL | Status: DC | PRN

## 2016-03-25 MED ORDER — HYDROCHLOROTHIAZIDE 25 MG PO TABS
25.0000 mg | ORAL_TABLET | Freq: Every day | ORAL | Status: DC
  Administered 2016-03-25 – 2016-03-26 (×2): 25 mg via ORAL
  Filled 2016-03-25 (×2): qty 1

## 2016-03-25 MED ORDER — ONDANSETRON HCL 4 MG PO TABS
8.0000 mg | ORAL_TABLET | Freq: Four times a day (QID) | ORAL | Status: DC | PRN
  Filled 2016-03-25: qty 1

## 2016-03-25 MED ORDER — FENTANYL CITRATE (PF) 100 MCG/2ML IJ SOLN
25.0000 ug | INTRAMUSCULAR | Status: DC | PRN
  Administered 2016-03-25 (×4): 50 ug via INTRAVENOUS
  Filled 2016-03-25 (×2): qty 2

## 2016-03-25 SURGICAL SUPPLY — 51 items
APPLIER CLIP 13 LRG OPEN (CLIP)
BAG HAMPER (MISCELLANEOUS) ×4 IMPLANT
CELLS DAT CNTRL 66122 CELL SVR (MISCELLANEOUS) IMPLANT
CLIP APPLIE 13 LRG OPEN (CLIP) IMPLANT
CLOTH BEACON ORANGE TIMEOUT ST (SAFETY) ×4 IMPLANT
COVER LIGHT HANDLE STERIS (MISCELLANEOUS) ×8 IMPLANT
DRAPE WARM FLUID 44X44 (DRAPE) ×4 IMPLANT
DRSG OPSITE POSTOP 4X10 (GAUZE/BANDAGES/DRESSINGS) ×4 IMPLANT
DRSG OPSITE POSTOP 4X8 (GAUZE/BANDAGES/DRESSINGS) ×4 IMPLANT
DRSG TELFA 3X8 NADH (GAUZE/BANDAGES/DRESSINGS) ×4 IMPLANT
ELECT REM PT RETURN 9FT ADLT (ELECTROSURGICAL) ×4
ELECTRODE REM PT RTRN 9FT ADLT (ELECTROSURGICAL) ×2 IMPLANT
FORMALIN 10 PREFIL 480ML (MISCELLANEOUS) ×4 IMPLANT
GLOVE BIO SURGEON STRL SZ7 (GLOVE) ×4 IMPLANT
GLOVE BIOGEL PI IND STRL 7.0 (GLOVE) ×12 IMPLANT
GLOVE BIOGEL PI IND STRL 8 (GLOVE) ×4 IMPLANT
GLOVE BIOGEL PI INDICATOR 7.0 (GLOVE) ×12
GLOVE BIOGEL PI INDICATOR 8 (GLOVE) ×4
GLOVE ECLIPSE 6.5 STRL STRAW (GLOVE) ×4 IMPLANT
GLOVE ECLIPSE 8.0 STRL XLNG CF (GLOVE) ×4 IMPLANT
GOWN STRL REUS W/TWL LRG LVL3 (GOWN DISPOSABLE) ×8 IMPLANT
GOWN STRL REUS W/TWL XL LVL3 (GOWN DISPOSABLE) ×4 IMPLANT
INST SET MAJOR GENERAL (KITS) ×4 IMPLANT
KIT ROOM TURNOVER APOR (KITS) ×4 IMPLANT
LIQUID BAND (GAUZE/BANDAGES/DRESSINGS) ×4 IMPLANT
MANIFOLD NEPTUNE II (INSTRUMENTS) ×4 IMPLANT
NEEDLE HYPO 21X1.5 SAFETY (NEEDLE) ×4 IMPLANT
NS IRRIG 1000ML POUR BTL (IV SOLUTION) ×12 IMPLANT
PACK ABDOMINAL MAJOR (CUSTOM PROCEDURE TRAY) ×4 IMPLANT
PAD ABD 8X10 STRL (GAUZE/BANDAGES/DRESSINGS) ×8 IMPLANT
PAD ARMBOARD 7.5X6 YLW CONV (MISCELLANEOUS) ×4 IMPLANT
RETRACTOR WND ALEXIS 25 LRG (MISCELLANEOUS) ×2 IMPLANT
RTRCTR WOUND ALEXIS 18CM MED (MISCELLANEOUS)
RTRCTR WOUND ALEXIS 25CM LRG (MISCELLANEOUS) ×4
SET BASIN LINEN APH (SET/KITS/TRAYS/PACK) ×4 IMPLANT
SPONGE GAUZE 4X4 12PLY (GAUZE/BANDAGES/DRESSINGS) ×4 IMPLANT
SPONGE LAP 18X18 X RAY DECT (DISPOSABLE) ×4 IMPLANT
STAPLER VISISTAT 35W (STAPLE) ×4 IMPLANT
SUT CHROMIC 0 CT 1 (SUTURE) ×4 IMPLANT
SUT MON AB 3-0 SH 27 (SUTURE) ×4 IMPLANT
SUT PLAIN 2 0 XLH (SUTURE) ×4 IMPLANT
SUT VIC AB 0 CT1 27 (SUTURE) ×6
SUT VIC AB 0 CT1 27XBRD ANTBC (SUTURE) IMPLANT
SUT VIC AB 0 CT1 27XCR 8 STRN (SUTURE) ×6 IMPLANT
SUT VIC AB 0 CTX 36 (SUTURE) ×2
SUT VIC AB 0 CTX36XBRD ANTBCTR (SUTURE) ×2 IMPLANT
SUT VICRYL 3 0 (SUTURE) ×4 IMPLANT
SYR 20CC LL (SYRINGE) ×4 IMPLANT
TAPE CLOTH SURG 4X10 WHT LF (GAUZE/BANDAGES/DRESSINGS) ×4 IMPLANT
TOWEL BLUE STERILE X RAY DET (MISCELLANEOUS) IMPLANT
TRAY FOLEY CATH SILVER 16FR (SET/KITS/TRAYS/PACK) ×4 IMPLANT

## 2016-03-25 NOTE — Anesthesia Postprocedure Evaluation (Signed)
Anesthesia Post Note  Patient: Rachel Ray  Procedure(s) Performed: Procedure(s) (LRB): HYSTERECTOMY ABDOMINAL (N/A) SALPINGO OOPHORECTOMY (Bilateral)  Patient location during evaluation: PACU Anesthesia Type: General Level of consciousness: awake, oriented and patient cooperative Pain management: pain level controlled Vital Signs Assessment: post-procedure vital signs reviewed and stable Respiratory status: spontaneous breathing and patient connected to face mask oxygen Cardiovascular status: blood pressure returned to baseline Postop Assessment: no signs of nausea or vomiting Anesthetic complications: no    Last Vitals:  Filed Vitals:   03/25/16 1110 03/25/16 1315  BP: 99/65   Temp:  36.4 C  Resp: 15     Last Pain:  Filed Vitals:   03/25/16 1322  PainSc: 5                  Lealer Marsland J

## 2016-03-25 NOTE — Anesthesia Procedure Notes (Addendum)
Procedure Name: Intubation Date/Time: 03/25/2016 11:21 AM Performed by: Tressie Stalker E Pre-anesthesia Checklist: Patient identified, Patient being monitored, Timeout performed, Emergency Drugs available and Suction available Patient Re-evaluated:Patient Re-evaluated prior to inductionOxygen Delivery Method: Circle System Utilized Preoxygenation: Pre-oxygenation with 100% oxygen Intubation Type: IV induction Ventilation: Mask ventilation without difficulty Laryngoscope Size: Mac and 3 Grade View: Grade I Tube type: Oral Tube size: 7.0 mm Number of attempts: 1 Airway Equipment and Method: stylet Placement Confirmation: ETT inserted through vocal cords under direct vision,  positive ETCO2 and breath sounds checked- equal and bilateral Secured at: 21 cm Tube secured with: Tape Dental Injury: Teeth and Oropharynx as per pre-operative assessment

## 2016-03-25 NOTE — Anesthesia Preprocedure Evaluation (Signed)
Anesthesia Evaluation  Patient identified by MRN, date of birth, ID band Patient awake    Reviewed: Allergy & Precautions, NPO status , Patient's Chart, lab work & pertinent test results  Airway Mallampati: II  TM Distance: >3 FB Neck ROM: Full    Dental  (+) Teeth Intact, Dental Advisory Given   Pulmonary Current Smoker,    breath sounds clear to auscultation       Cardiovascular hypertension, Pt. on medications  Rhythm:Regular Rate:Normal     Neuro/Psych PSYCHIATRIC DISORDERS Anxiety Depression    GI/Hepatic negative GI ROS,   Endo/Other    Renal/GU      Musculoskeletal   Abdominal   Peds  Hematology  (+) anemia ,   Anesthesia Other Findings   Reproductive/Obstetrics                             Anesthesia Physical Anesthesia Plan  ASA: II  Anesthesia Plan: General   Post-op Pain Management:    Induction: Intravenous  Airway Management Planned: Oral ETT  Additional Equipment:   Intra-op Plan:   Post-operative Plan: Extubation in OR  Informed Consent: I have reviewed the patients History and Physical, chart, labs and discussed the procedure including the risks, benefits and alternatives for the proposed anesthesia with the patient or authorized representative who has indicated his/her understanding and acceptance.     Plan Discussed with:   Anesthesia Plan Comments:         Anesthesia Quick Evaluation

## 2016-03-25 NOTE — H&P (Signed)
Preoperative History and Physical  Rachel Ray is a 35 y.o. [phone] with Patient's last menstrual period was 11/16/2015. admitted for a TAHBSO .  Patient has ongoing menometrorrhagia that is not controllable on megestrol Additionally she has bilateral pelvic pain in the adnexal area reproducible on pelvic exam and during vaginal probe Also with >50% dyspareunia with thrusting   Pt aware the surgical decision is based on addressing all of these issues  PMH:  Past Medical History  Diagnosis Date  . Depression   . Anxiety   . Hypertension   . Arthritis   . Uterine fibroid   . Chronic pelvic pain in female     PSH:  Past Surgical History  Procedure Laterality Date  . Cesarean section      x 3  . Cholecystectomy    . Tonsillectomy    . Adenoidectomy    . Tubal ligation      POb/GynH:  OB History    Gravida Para Term Preterm AB TAB SAB Ectopic Multiple Living   4 3 3  1  1   3       SH:  Social History  Substance Use Topics  . Smoking status: Current Every Day Smoker -- 0.25 packs/day for 15 years    Types: Cigarettes  . Smokeless tobacco: Never Used  . Alcohol Use: No    FH:  Family History  Problem Relation Age of Onset  . Cancer Other     great grandmother-breast cancer  . Cancer Maternal Aunt     breast cancer  . Diabetes Maternal Grandmother   . Diabetes Maternal Grandfather      Allergies:  Allergies  Allergen Reactions  . Shellfish Allergy Anaphylaxis, Shortness Of Breath and Swelling  . Codeine Other (See Comments)    Abdominal pain  . Penicillins Itching    Medications:  Current outpatient prescriptions:  . ALPRAZolam (XANAX) 0.5 MG tablet, Take 1 tablet (0.5 mg total) by mouth 2 (two) times daily., Disp: 20 tablet, Rfl: 0 . citalopram (CELEXA) 10 MG tablet, Take 10 mg by mouth daily.,  Disp: , Rfl:  . HYDROcodone-acetaminophen (NORCO/VICODIN) 5-325 MG tablet, Take 1 tablet by mouth every 4 (four) hours as needed., Disp: 30 tablet, Rfl: 0 . ibuprofen (ADVIL,MOTRIN) 800 MG tablet, Take 1 tablet (800 mg total) by mouth 3 (three) times daily., Disp: 21 tablet, Rfl: 0 . megestrol (MEGACE) 40 MG tablet, Take 3 tablets all at once daily, Disp: 90 tablet, Rfl: 1 . cyclobenzaprine (FLEXERIL) 10 MG tablet, Take 1 tablet (10 mg total) by mouth 3 (three) times daily as needed for muscle spasms. (Patient not taking: Reported on 11/26/2015), Disp: 12 tablet, Rfl: 0  Review of Systems:   Review of Systems  Constitutional: Negative for fever, chills, weight loss, malaise/fatigue and diaphoresis.  HENT: Negative for hearing loss, ear pain, nosebleeds, congestion, sore throat, neck pain, tinnitus and ear discharge.  Eyes: Negative for blurred vision, double vision, photophobia, pain, discharge and redness.  Respiratory: Negative for cough, hemoptysis, sputum production, shortness of breath, wheezing and stridor.  Cardiovascular: Negative for chest pain, palpitations, orthopnea, claudication, leg swelling and PND.  Gastrointestinal: Positive for abdominal pain. Negative for heartburn, nausea, vomiting, diarrhea, constipation, blood in stool and melena.  Genitourinary: Negative for dysuria, urgency, frequency, hematuria and flank pain.  Musculoskeletal: Negative for myalgias, back pain, joint pain and falls.  Skin: Negative for itching and rash.  Neurological: Negative for dizziness, tingling, tremors, sensory change, speech change, focal weakness, seizures, loss  of consciousness, weakness and headaches.  Endo/Heme/Allergies: Negative for environmental allergies and polydipsia. Does not bruise/bleed easily.  Psychiatric/Behavioral: Negative for depression, suicidal ideas, hallucinations, memory loss and substance abuse. The patient is not nervous/anxious and does not have insomnia.      PHYSICAL EXAM:  Blood pressure 100/60, pulse 66, weight 209 lb (94.802 kg), last menstrual period 11/16/2015.   Vitals reviewed. Constitutional: She is oriented to person, place, and time. She appears well-developed and well-nourished.  HENT:  Head: Normocephalic and atraumatic.  Right Ear: External ear normal.  Left Ear: External ear normal.  Nose: Nose normal.  Mouth/Throat: Oropharynx is clear and moist.  Eyes: Conjunctivae and EOM are normal. Pupils are equal, round, and reactive to light. Right eye exhibits no discharge. Left eye exhibits no discharge. No scleral icterus.  Neck: Normal range of motion. Neck supple. No tracheal deviation present. No thyromegaly present.  Cardiovascular: Normal rate, regular rhythm, normal heart sounds and intact distal pulses. Exam reveals no gallop and no friction rub.  No murmur heard. Respiratory: Effort normal and breath sounds normal. No respiratory distress. She has no wheezes. She has no rales. She exhibits no tenderness.  GI: Soft. Bowel sounds are normal. She exhibits no distension and no mass. There is tenderness. There is no rebound and no guarding.  Genitourinary:   Vulva is normal without lesions Vagina is pink moist without discharge Cervix normal in appearance and pap is normal Uterus is normal size, contour, position, consistency, mobility, non-tender but is on palpation Adnexa is negative tender on palpation Musculoskeletal: Normal range of motion. She exhibits no edema and no tenderness.  Neurological: She is alert and oriented to person, place, and time. She has normal reflexes. She displays normal reflexes. No cranial nerve deficit. She exhibits normal muscle tone. Coordination normal.  Skin: Skin is warm and dry. No rash noted. No erythema. No pallor.  Psychiatric: She has a normal mood and affect. Her behavior is normal. Judgment and thought content normal.    Labs: Results for orders placed or performed  during the hospital encounter of 11/17/15 (from the past 336 hour(s))  Pregnancy, urine   Collection Time: 11/17/15 7:54 PM  Result Value Ref Range   Preg Test, Ur NEGATIVE NEGATIVE  Urinalysis, Routine w reflex microscopic (not at [facility])   Collection Time: 11/17/15 7:54 PM  Result Value Ref Range   Color, Urine RED (A) YELLOW   APPearance CLOUDY (A) CLEAR   Specific Gravity, Urine 1.020 1.005 - 1.030   pH 7.0 5.0 - 8.0   Glucose, UA NEGATIVE NEGATIVE mg/dL   Hgb urine dipstick LARGE (A) NEGATIVE   Bilirubin Urine SMALL (A) NEGATIVE   Ketones, ur TRACE (A) NEGATIVE mg/dL   Protein, ur 100 (A) NEGATIVE mg/dL   Nitrite NEGATIVE NEGATIVE   Leukocytes, UA NEGATIVE NEGATIVE  Urine microscopic-add on   Collection Time: 11/17/15 7:54 PM  Result Value Ref Range   Squamous Epithelial / LPF 0-5 (A) NONE SEEN   WBC, UA 6-30 0 - 5 WBC/hpf   RBC / HPF TOO NUMEROUS TO COUNT 0 - 5 RBC/hpf   Bacteria, UA RARE (A) NONE SEEN  CBC with Differential/Platelet   Collection Time: 11/17/15 8:03 PM  Result Value Ref Range   WBC 5.1 4.0 - 10.5 K/uL   RBC 3.74 (L) 3.87 - 5.11 MIL/uL   Hemoglobin 9.9 (L) 12.0 - 15.0 g/dL   HCT 31.6 (L) 36.0 - 46.0 %   MCV 84.5 78.0 - 100.0 fL  MCH 26.5 26.0 - 34.0 pg   MCHC 31.3 30.0 - 36.0 g/dL   RDW 15.4 11.5 - 15.5 %   Platelets 219 150 - 400 K/uL   Neutrophils Relative % 48 %   Neutro Abs 2.4 1.7 - 7.7 K/uL   Lymphocytes Relative 44 %   Lymphs Abs 2.2 0.7 - 4.0 K/uL   Monocytes Relative 7 %   Monocytes Absolute 0.3 0.1 - 1.0 K/uL   Eosinophils Relative 1 %   Eosinophils Absolute 0.1 0.0 - 0.7 K/uL   Basophils Relative 0 %   Basophils Absolute 0.0 0.0 - 0.1 K/uL  Basic metabolic panel   Collection Time: 11/17/15 8:03 PM  Result Value Ref Range   Sodium 139 135 - 145 mmol/L    Potassium 3.8 3.5 - 5.1 mmol/L   Chloride 108 101 - 111 mmol/L   CO2 23 22 - 32 mmol/L   Glucose, Bld 84 65 - 99 mg/dL   BUN 11 6 - 20 mg/dL   Creatinine, Ser 0.92 0.44 - 1.00 mg/dL   Calcium 9.0 8.9 - 10.3 mg/dL   GFR calc non Af Amer >60 >60 mL/min   GFR calc Af Amer >60 >60 mL/min   Anion gap 8 5 - 15  Type and screen   Collection Time: 11/17/15 8:03 PM  Result Value Ref Range   ABO/RH(D) O POS    Antibody Screen NEG    Sample Expiration 11/20/2015     EKG: No orders found for this or any previous visit.  Imaging Studies: GYNECOLOGIC SONOGRAM   Rachel Ray is a 35 y.o. [id number] LMP 10/09/2015 for a pelvic sonogram for menometrorrhagia and fibroids.  Uterus 9.2 x 5.2 x 6.4 cm, heterogenous anteverted uterus w/ a 2.3 x 1.7 x 2.3cm ant.left subserosal fibroid  Endometrium 9.5 mm, symmetrical, wnl  Right ovary 2.2 x 1.3 x 2.1 cm, wnl  Left ovary 3.2 x 1.7 x 3.0 cm, wnl   Technician Comments:  PELVIC US TA/TV: heterogenous anteverted uterus w/ a 2.3 x 1.7 x 2.3cm ant.left subserosal fibroid,EEC 9.5 mm,normal ov's bilat (mobile),no free fluid seen    [employer] 10/15/2015 3:10 PM  Clinical Impression and recommendations:  I have reviewed the sonogram results above, combined with the patient's current clinical course, below are my impressions and any appropriate recommendations for management based on the sonographic findings.  Normal size uterus with small subserosal fibroid that is most likely clinically irrelevant Ovaries are normal No other abnormalities   Jamill Wetmore H 10/15/2015 3:55 PM    Assessment: Menometrorrhagia  Dysmenorrhea  Dyspareunia, female  Anemia   Plan: TAHBSO 03/25/2016  Pt understands the risks of surgery including but not limited t excessive bleeding requiring transfusion or reoperation, post-operative  infection requiring prolonged hospitalization or re-hospitalization and antibiotic therapy, and damage to other organs including bladder, bowel, ureters and major vessels. The patient also understands the alternative treatment options which were discussed in full. All questions were answered.       Florian Buff, MD 03/25/2016 10:40 AM

## 2016-03-25 NOTE — Transfer of Care (Signed)
Immediate Anesthesia Transfer of Care Note  Patient: Rachel Ray  Procedure(s) Performed: Procedure(s): HYSTERECTOMY ABDOMINAL (N/A) SALPINGO OOPHORECTOMY (Bilateral)  Patient Location: PACU  Anesthesia Type:General  Level of Consciousness: awake and patient cooperative  Airway & Oxygen Therapy: Patient Spontanous Breathing and Patient connected to face mask oxygen  Post-op Assessment: Report given to RN, Post -op Vital signs reviewed and stable and Patient moving all extremities  Post vital signs: Reviewed and stable  Last Vitals:  Filed Vitals:   03/25/16 1105 03/25/16 1110  BP: 110/54 99/65  Temp:    Resp: 17 15    Last Pain:  Filed Vitals:   03/25/16 1110  PainSc: 5       Patients Stated Pain Goal: 7 ([id number])  Complications: No apparent anesthesia complications

## 2016-03-25 NOTE — Op Note (Addendum)
Preoperative diagnosis:  1.  Menometrorrhagia                                          2.  Dysmenorrhea                                         3.  Dyspareunia                                         4.  Anemia  Postoperative diagnosis:  Same as above +   Procedure:  Abdominal hysterectomy, including cervix, with removal of both tubes and ovaries  Surgeon:  Florian Buff  Assistant:    Anesthesia:  General endotracheal  Preoperative clinical summary:  Rachel Ray is a 35 y.o. [phone] with Patient's last menstrual period was 11/16/2015. admitted for a TAHBSO .  Patient has ongoing menometrorrhagia that is not controllable on megestrol Additionally she has bilateral pelvic pain in the adnexal area reproducible on pelvic exam and during vaginal probe Also with >50% dyspareunia with thrusting   Pt aware the surgical decision is based on addressing all of these issues  Intraoperative findings: fibroids, otherwise normal pelvic anatomy  Description of operation:  Patient was taken to the operating room and placed in the supine position where she underwent general endotracheal anesthesia.  She was then prepped and draped in the usual sterile fashion and a Foley catheter was placed for continuous bladder drainage.  A Pfannenstiel skin incision was made and carried down sharply to the rectus fascia which was scored in the midline and extended laterally.  The fascia was taken off the muscles superiorly and inferiorly without difficulty.  The muscles were divided.  The peritoneal cavity was entered.  An medium Alexis self-retaining retractor was placed.  The upper abdomen was packed away. Both uterine cornu were grasped with Coker clamps.  The left round ligament was suture ligated and coagulated with the electrocautery unit.  The left vesicouterine serosal flap was created.  An avascular window in in the peritoneum was created and the left infundibulo pelvic ligament was cross clamped, cut and  suture ligated.  The right round ligament was suture ligated and cut with the electrocautery unit.  The vesicouterine serosal flap on the right was created.  An avascular window in the peritoneum was created and the right infundibulo pelvic ligament was cross clamped, cut and double suture ligated.  Thus both ovaries were removed.  The uterine vessels were skeletonized bilaterally.  The uterine vessels were clamped bilaterally,  then cut and suture ligated.  Two more pedicles were taken down the cervix medial to the uterine vessels.  Each pedicle was clamped cut and suture ligated with good resulting hemostasis.the vagina was cross clamped and the uterus and cervix was removed.  The vagina was closed with interrupted figure of 8 sutures  The pelvis was irrigated vigorously and all pedicles were examined and found to be hemostatic.    All specimens were sent to pathology for routine evaluation.  The Alexis self-retaining retractor was removed and the pelvis was irrigated vigorously.  All packs were removed and all counts were correct at this point x 3.  The muscles  and peritoneum were reapproximated loosely.  The fascia was closed with 0 Vicryl running.  The subcutaneous tissue was reapproximated using 2-0 plain gut.  The skin was closed using 3-0 Vicryl on a Keith needle in a subcuticular fashion.  Liquiban was then applied for additional wound integrity and to serve as a postoperative bacterial barrier.  The patient was awakened from anesthesia taken to the recovery room in good stable condition. All sponge instrument and needle counts were correct x 3.  The patient received Ancef and Toradol prophylactically preoperatively.  Estimated blood loss for the procedure was 150  cc.  EURE,LUTHER H

## 2016-03-25 NOTE — Interval H&P Note (Signed)
History and Physical Interval Note:  03/25/2016 10:40 AM  Rachel Ray  has presented today for surgery, with the diagnosis of uterine fibroids abdominal pain  The various methods of treatment have been discussed with the patient and family. After consideration of risks, benefits and other options for treatment, the patient has consented to  Procedure(s): HYSTERECTOMY ABDOMINAL (N/A) SALPINGO OOPHORECTOMY (Bilateral) as a surgical intervention .  The patient's history has been reviewed, patient examined, no change in status, stable for surgery.  I have reviewed the patient's chart and labs.  Questions were answered to the patient's satisfaction.     Rachel Ray H

## 2016-03-26 ENCOUNTER — Encounter (HOSPITAL_COMMUNITY): Payer: Self-pay | Admitting: Obstetrics & Gynecology

## 2016-03-26 DIAGNOSIS — N921 Excessive and frequent menstruation with irregular cycle: Secondary | ICD-10-CM | POA: Diagnosis not present

## 2016-03-26 LAB — CBC
HEMATOCRIT: 32.8 % — AB (ref 36.0–46.0)
HEMOGLOBIN: 10.7 g/dL — AB (ref 12.0–15.0)
MCH: 28.8 pg (ref 26.0–34.0)
MCHC: 32.6 g/dL (ref 30.0–36.0)
MCV: 88.4 fL (ref 78.0–100.0)
Platelets: 186 10*3/uL (ref 150–400)
RBC: 3.71 MIL/uL — ABNORMAL LOW (ref 3.87–5.11)
RDW: 14.1 % (ref 11.5–15.5)
WBC: 6.9 10*3/uL (ref 4.0–10.5)

## 2016-03-26 LAB — BASIC METABOLIC PANEL
Anion gap: 7 (ref 5–15)
BUN: 5 mg/dL — AB (ref 6–20)
CHLORIDE: 105 mmol/L (ref 101–111)
CO2: 25 mmol/L (ref 22–32)
Calcium: 8.3 mg/dL — ABNORMAL LOW (ref 8.9–10.3)
Creatinine, Ser: 0.72 mg/dL (ref 0.44–1.00)
GFR calc non Af Amer: >60 mL/min (ref >60)
GLUCOSE: 124 mg/dL — AB (ref 65–99)
Potassium: 3.3 mmol/L — ABNORMAL LOW (ref 3.5–5.1)
Sodium: 137 mmol/L (ref 135–145)

## 2016-03-26 MED ORDER — BISACODYL 10 MG RE SUPP: 10.0000 mg | Freq: Every day | RECTAL | Status: DC | PRN

## 2016-03-26 MED ORDER — ONDANSETRON HCL 8 MG PO TABS: 8.0000 mg | ORAL_TABLET | Freq: Four times a day (QID) | ORAL | Status: DC | PRN

## 2016-03-26 MED ORDER — HYDROMORPHONE HCL 2 MG PO TABS
2.0000 mg | ORAL_TABLET | ORAL | Status: DC | PRN
  Administered 2016-03-26: 2 mg via ORAL
  Filled 2016-03-26: qty 1

## 2016-03-26 MED ORDER — HYDROMORPHONE HCL 2 MG PO TABS: 2.0000 mg | ORAL_TABLET | ORAL | Status: DC | PRN

## 2016-03-26 MED ORDER — KETOROLAC TROMETHAMINE 30 MG/ML IJ SOLN
30.0000 mg | Freq: Once | INTRAMUSCULAR | Status: AC
  Administered 2016-03-26: 30 mg via INTRAVENOUS
  Filled 2016-03-26: qty 1

## 2016-03-26 MED ORDER — KETOROLAC TROMETHAMINE 10 MG PO TABS: 10.0000 mg | ORAL_TABLET | Freq: Three times a day (TID) | ORAL | Status: DC | PRN

## 2016-03-26 NOTE — Discharge Instructions (Signed)
Abdominal Hysterectomy, Care After °Refer to this sheet in the next few weeks. These instructions provide you with information on caring for yourself after your procedure. Your health care provider may also give you more specific instructions. Your treatment has been planned according to current medical practices, but problems sometimes occur. Call your health care provider if you have any problems or questions after your procedure.  °WHAT TO EXPECT AFTER THE PROCEDURE °After your procedure, it is typical to have the following: °· Pain. °· Feeling tired. °· Poor appetite. °· Less interest in sex. °It takes 4-6 weeks to recover from this surgery.  °HOME CARE INSTRUCTIONS  °· Take pain medicines only as directed by your health care provider. Do not take over-the-counter pain medicines without checking with your health care provider first.  °· Change your bandage as directed by your health care provider. °· Return to your health care provider to have your sutures taken out. °· Take showers instead of baths for 2-3 weeks. Ask your health care provider when it is safe to start showering.  °· Do not douche, use tampons, or have sexual intercourse for at least 6 weeks or until your health care provider says you can.   °· Follow your health care provider's advice about exercise, lifting, driving, and general activities. °· Get plenty of rest and sleep.   °· Do not lift anything heavier than a gallon of milk (about 10 lb [4.5 kg]) for the first month after surgery. °· You can resume your normal diet if your health care provider says it is okay.   °· Do not drink alcohol until your health care provider says you can.   °· If you are constipated, ask your health care provider if you can take a mild laxative. °· Eating foods high in fiber may also help with constipation. Eat plenty of raw fruits and vegetables, whole grains, and beans. °· Drink enough fluids to keep your urine clear or pale yellow.   °· Try to have someone at  home with you for the first 1-2 weeks to help around the house. °· Keep all follow-up appointments. °SEEK MEDICAL CARE IF:  °· You have chills or fever. °· You have swelling, redness, or pain in the area of your incision that is getting worse.   °· You have pus coming from the incision.   °· You notice a bad smell coming from the incision or bandage.   °· Your incision breaks open.   °· You feel dizzy or light-headed.   °· You have pain or bleeding when you urinate.   °· You have persistent diarrhea.   °· You have persistent nausea and vomiting.   °· You have abnormal vaginal discharge.   °· You have a rash.   °· You have any type of abnormal reaction or develop an allergy to your medicine.   °· Your pain medicine is not helping.   °SEEK IMMEDIATE MEDICAL CARE IF:  °· You have a fever and your symptoms suddenly get worse. °· You have severe abdominal pain. °· You have chest pain. °· You have shortness of breath. °· You faint. °· You have pain, swelling, or redness of your leg. °· You have heavy vaginal bleeding with blood clots. °MAKE SURE YOU: °· Understand these instructions. °· Will watch your condition. °· Will get help right away if you are not doing well or get worse. °  °This information is not intended to replace advice given to you by your health care provider. Make sure you discuss any questions you have with your health care provider. °  °Document   Released: 05/29/2005 Document Revised: 11/30/2014 Document Reviewed: 09/01/2013 °Elsevier Interactive Patient Education ©2016 [employer] ° °

## 2016-03-26 NOTE — Anesthesia Postprocedure Evaluation (Signed)
Anesthesia Post Note  Patient: Rachel Ray  Procedure(s) Performed: Procedure(s) (LRB): HYSTERECTOMY ABDOMINAL (N/A) SALPINGO OOPHORECTOMY (Bilateral)  Patient location during evaluation: Nursing Unit Anesthesia Type: General Level of consciousness: awake, oriented and patient cooperative Pain management: pain level not controlled Vital Signs Assessment: post-procedure vital signs reviewed and stable Respiratory status: spontaneous breathing, nonlabored ventilation, respiratory function stable and patient connected to nasal cannula oxygen Cardiovascular status: blood pressure returned to baseline Anesthetic complications: no    Last Vitals:  Filed Vitals:   03/26/16 0304 03/26/16 0545  BP: 98/57 113/69  Pulse: 54 59  Temp: 37 C 37.1 C  Resp: 20 20    Last Pain:  Filed Vitals:   03/26/16 0929  PainSc: 10-Worst pain ever                 Adamari Frede J

## 2016-03-26 NOTE — Discharge Summary (Signed)
Physician Discharge Summary  Patient ID: Rachel Ray MRN: [id number] DOB/AGE: 37-Sep-1982 37 y.o.  Admit date: 03/25/2016 Discharge date: 03/26/2016  Admission Diagnoses:s/p TAHBSO  Discharge Diagnoses:  Active Problems:   S/P hysterectomy   Discharged Condition: good  Hospital Course: unremarkable post operative course  Consults: None  Significant Diagnostic Studies: labs:   Treatments: surgery: TAHBSO  Discharge Exam: Blood pressure 113/69, pulse 59, temperature 98.7 F (37.1 C), temperature source Oral, resp. rate 20, height 5\' 3"  (1.6 m), weight 209 lb (94.802 kg), last menstrual period 03/13/2016, SpO2 99 %. General appearance: alert, cooperative and no distress GI: soft, non-tender; bowel sounds normal; no masses,  no organomegaly Incision/Wound:incision clean dry intact  Disposition: 01-Home or Self Care  Discharge Instructions    Call MD for:  persistant nausea and vomiting    Complete by:  As directed      Call MD for:  severe uncontrolled pain    Complete by:  As directed      Call MD for:  temperature >100.4    Complete by:  As directed      Diet - low sodium heart healthy    Complete by:  As directed      Driving Restrictions    Complete by:  As directed   No driving for 1-2 weeks     Increase activity slowly    Complete by:  As directed      Leave dressing on - Keep it clean, dry, and intact until clinic visit    Complete by:  As directed      Lifting restrictions    Complete by:  As directed   Do not lift more than 10 pounds     Sexual Activity Restrictions    Complete by:  As directed   Ummm.Marland Kitchen   NO            Medication List    STOP taking these medications        HYDROcodone-acetaminophen 5-325 MG tablet  Commonly known as:  NORCO/VICODIN     ibuprofen 800 MG tablet  Commonly known as:  ADVIL,MOTRIN     megestrol 40 MG tablet  Commonly known as:  MEGACE     naproxen 250 MG tablet  Commonly known as:  NAPROSYN      TAKE these  medications        ALPRAZolam 0.5 MG tablet  Commonly known as:  XANAX  Take 1 tablet (0.5 mg total) by mouth 2 (two) times daily.     bisacodyl 10 MG suppository  Commonly known as:  DULCOLAX  Place 1 suppository (10 mg total) rectally daily as needed for moderate constipation.     citalopram 10 MG tablet  Commonly known as:  CELEXA  Take 10 mg by mouth daily as needed (for depression).     hydrochlorothiazide 25 MG tablet  Commonly known as:  HYDRODIURIL  Take 25 mg by mouth daily.     HYDROmorphone 2 MG tablet  Commonly known as:  DILAUDID  Take 1-2 tablets (2-4 mg total) by mouth every 4 (four) hours as needed for severe pain.     ketorolac 10 MG tablet  Commonly known as:  TORADOL  Take 1 tablet (10 mg total) by mouth every 8 (eight) hours as needed.     ketorolac 10 MG tablet  Commonly known as:  TORADOL  Take 1 tablet (10 mg total) by mouth every 8 (eight) hours as needed.     metroNIDAZOLE  500 MG tablet  Commonly known as:  FLAGYL  Take 1 tablet (500 mg total) by mouth 2 (two) times daily.     ondansetron 8 MG tablet  Commonly known as:  ZOFRAN  Take 1 tablet (8 mg total) by mouth every 6 (six) hours as needed for nausea.     traZODone 50 MG tablet  Commonly known as:  DESYREL  Take 50 mg by mouth at bedtime.           Follow-up Information    Follow up with Florian Buff, MD In 1 week.   Specialties:  Obstetrics and Gynecology, Radiology   Why:  post op visit   Contact information:   [address] [place] [address] [phone]       Signed: Florian Buff 03/26/2016, 1:12 PM

## 2016-03-26 NOTE — Addendum Note (Signed)
Addendum  created 03/26/16 0930 by Charmaine Downs, CRNA   Modules edited: Clinical Notes   Clinical Notes:  File: [id number]

## 2016-03-26 NOTE — Progress Notes (Signed)
Patient's IV removed.  Site WNL.  AVS reviewed with patient.  Verbalized understanding of discharge instructions, physician follow-up, and medications.  Prescription for Dilaudid given to patient.  Patient reports belongings intact and in possession at time of discharge.  Patient waiting for her boyfriend to provide transport home.  Patient stable.

## 2016-03-26 NOTE — Progress Notes (Signed)
Pt pain not relieved by PRN Percocet.  Notified Dr Elonda Husky.  New order for Toradol 30 MG IV once and Dilaudid 2mg -4mg  PO Q4H PRN given.  Will continue to monitor.

## 2016-03-27 ENCOUNTER — Telehealth: Payer: Self-pay | Admitting: Obstetrics & Gynecology

## 2016-03-27 NOTE — Telephone Encounter (Signed)
Pt states had surgery with Dr. Elonda Husky on 03/25/2016, Hysterectomy Abdominal, taking the Dilaudid "not touching abdominal pain." Pt states unable to get the Toradol RX not covered by insurance. Please advise.

## 2016-03-27 NOTE — Telephone Encounter (Signed)
Pt informed per Dr. Elonda Husky can take Ibuprofen 800 mg and OTC Acetomenophin 500 mg q 6 hours with the Dilaudid PRN. Pt taking the strongest oral pain medicines post operatively.  Pt informed should see improvement with time. Only day 2 post op. Pt verbalized understanding.

## 2016-03-27 NOTE — Telephone Encounter (Signed)
She is on the strongest oral pain medicine there is, she can supplement with over the counter ibuprofen 800 mg instead of toradol and also OTC acetomenophin 6250 mg every 6 hours  but she is on more than approrpriate post operative pain management What she is having is normal  Rachel Ray has a low pain threshold

## 2016-03-28 ENCOUNTER — Emergency Department (HOSPITAL_COMMUNITY): Payer: Medicare Other

## 2016-03-28 ENCOUNTER — Emergency Department (HOSPITAL_COMMUNITY)
Admission: EM | Admit: 2016-03-28 | Discharge: 2016-03-29 | Disposition: A | Discharge status: Home / Self Care | Payer: Medicare Other | Attending: Emergency Medicine | Admitting: Emergency Medicine

## 2016-03-28 ENCOUNTER — Encounter (HOSPITAL_COMMUNITY): Payer: Self-pay

## 2016-03-28 DIAGNOSIS — M1990 Unspecified osteoarthritis, unspecified site: Secondary | ICD-10-CM | POA: Diagnosis not present

## 2016-03-28 DIAGNOSIS — I10 Essential (primary) hypertension: Secondary | ICD-10-CM | POA: Diagnosis not present

## 2016-03-28 DIAGNOSIS — F17210 Nicotine dependence, cigarettes, uncomplicated: Secondary | ICD-10-CM | POA: Insufficient documentation

## 2016-03-28 DIAGNOSIS — Z79899 Other long term (current) drug therapy: Secondary | ICD-10-CM | POA: Diagnosis not present

## 2016-03-28 DIAGNOSIS — R1030 Lower abdominal pain, unspecified: Secondary | ICD-10-CM | POA: Insufficient documentation

## 2016-03-28 DIAGNOSIS — G8918 Other acute postprocedural pain: Secondary | ICD-10-CM | POA: Diagnosis present

## 2016-03-28 DIAGNOSIS — F329 Major depressive disorder, single episode, unspecified: Secondary | ICD-10-CM | POA: Insufficient documentation

## 2016-03-28 LAB — CBC WITH DIFFERENTIAL/PLATELET
BASOS PCT: 0 %
Basophils Absolute: 0 10*3/uL (ref 0.0–0.1)
EOS ABS: 0.1 10*3/uL (ref 0.0–0.7)
EOS PCT: 3 %
HCT: 34 % — ABNORMAL LOW (ref 36.0–46.0)
Hemoglobin: 11.2 g/dL — ABNORMAL LOW (ref 12.0–15.0)
Lymphocytes Relative: 30 %
Lymphs Abs: 1.4 10*3/uL (ref 0.7–4.0)
MCH: 28.9 pg (ref 26.0–34.0)
MCHC: 32.9 g/dL (ref 30.0–36.0)
MCV: 87.9 fL (ref 78.0–100.0)
MONO ABS: 0.4 10*3/uL (ref 0.1–1.0)
Monocytes Relative: 9 %
Neutro Abs: 2.6 10*3/uL (ref 1.7–7.7)
Neutrophils Relative %: 58 %
PLATELETS: 190 10*3/uL (ref 150–400)
RBC: 3.87 MIL/uL (ref 3.87–5.11)
RDW: 13.9 % (ref 11.5–15.5)
WBC: 4.6 10*3/uL (ref 4.0–10.5)

## 2016-03-28 LAB — URINALYSIS, ROUTINE W REFLEX MICROSCOPIC
Bilirubin Urine: NEGATIVE
GLUCOSE, UA: NEGATIVE mg/dL
Leukocytes, UA: NEGATIVE
Nitrite: NEGATIVE
Specific Gravity, Urine: <1.005 — ABNORMAL LOW (ref 1.005–1.030)
pH: 8 (ref 5.0–8.0)

## 2016-03-28 LAB — BASIC METABOLIC PANEL
Anion gap: 9 (ref 5–15)
BUN: 6 mg/dL (ref 6–20)
CALCIUM: 9 mg/dL (ref 8.9–10.3)
CO2: 26 mmol/L (ref 22–32)
Chloride: 103 mmol/L (ref 101–111)
Creatinine, Ser: 0.75 mg/dL (ref 0.44–1.00)
GLUCOSE: 99 mg/dL (ref 65–99)
Potassium: 3.5 mmol/L (ref 3.5–5.1)
Sodium: 138 mmol/L (ref 135–145)

## 2016-03-28 LAB — URINE MICROSCOPIC-ADD ON

## 2016-03-28 MED ORDER — FENTANYL CITRATE (PF) 100 MCG/2ML IJ SOLN
100.0000 ug | Freq: Once | INTRAMUSCULAR | Status: AC
Start: 2016-03-28 — End: 2016-03-28
  Administered 2016-03-28: 100 ug via INTRAVENOUS

## 2016-03-28 MED ORDER — IOPAMIDOL (ISOVUE-300) INJECTION 61%
100.0000 mL | Freq: Once | INTRAVENOUS | Status: AC | PRN
  Administered 2016-03-28: 100 mL via INTRAVENOUS

## 2016-03-28 MED ORDER — MORPHINE SULFATE (PF) 4 MG/ML IV SOLN
6.0000 mg | Freq: Once | INTRAVENOUS | Status: AC
  Administered 2016-03-28: 6 mg via INTRAVENOUS
  Filled 2016-03-28: qty 2

## 2016-03-28 MED ORDER — FENTANYL CITRATE (PF) 100 MCG/2ML IJ SOLN
INTRAMUSCULAR | Status: AC
  Filled 2016-03-28: qty 2

## 2016-03-28 MED ORDER — OXYCODONE-ACETAMINOPHEN 5-325 MG PO TABS
1.0000 | ORAL_TABLET | Freq: Once | ORAL | Status: AC
  Administered 2016-03-28: 1 via ORAL
  Filled 2016-03-28: qty 1

## 2016-03-28 MED ORDER — HYDROMORPHONE HCL 2 MG/ML IJ SOLN: 2.0000 mg | Freq: Once | INTRAMUSCULAR | Status: DC

## 2016-03-28 MED ORDER — FENTANYL CITRATE (PF) 100 MCG/2ML IJ SOLN
100.0000 ug | Freq: Once | INTRAMUSCULAR | Status: AC
  Administered 2016-03-28: 100 ug via INTRAVENOUS
  Filled 2016-03-28: qty 2

## 2016-03-28 MED ORDER — OXYCODONE-ACETAMINOPHEN 5-325 MG PO TABS: 1.0000 | ORAL_TABLET | Freq: Four times a day (QID) | ORAL | Status: DC | PRN

## 2016-03-28 MED ORDER — SODIUM CHLORIDE 0.9 % IV BOLUS (SEPSIS)
2000.0000 mL | Freq: Once | INTRAVENOUS | Status: AC
  Administered 2016-03-28: 1000 mL via INTRAVENOUS

## 2016-03-28 NOTE — ED Notes (Signed)
Post void residual 60 cc.

## 2016-03-28 NOTE — ED Provider Notes (Addendum)
CSN: [id number]     Arrival date & time 03/28/16  2016 History   First MD Initiated Contact with Patient 03/28/16 2033     Chief Complaint  Patient presents with  . Post-op Problem     (Consider location/radiation/quality/duration/timing/severity/associated sxs/prior Treatment) HPI Complains of suprapubic/infraumbilical pain radiating to lower back bilaterally onset 2 days ago following hysterectomy with bilateral salpingectomy and oophorectomy and pain is constant not made better or worse by anything she denies urinary symptoms last bowel movement this morning normal. No change in appetite. No fever. Nothing makes pain better or worse. She has been treated with hydromorphone, without relief. Last dose 2 hours ago. No other associated symptoms Past Medical History  Diagnosis Date  . Depression   . Anxiety   . Hypertension   . Uterine fibroid   . Chronic pelvic pain in female   . Menorrhagia   . Dysmenorrhea   . Arthritis     rheumatoid  . Anemia    Past Surgical History  Procedure Laterality Date  . Cesarean section      x 3  . Cholecystectomy    . Tonsillectomy    . Adenoidectomy    . Tubal ligation    . Abdominal hysterectomy N/A 03/25/2016    Procedure: HYSTERECTOMY ABDOMINAL;  Surgeon: Florian Buff, MD;  Location: [facility];  Service: Gynecology;  Laterality: N/A;  . Salpingoophorectomy Bilateral 03/25/2016    Procedure: SALPINGO OOPHORECTOMY;  Surgeon: Florian Buff, MD;  Location: [facility];  Service: Gynecology;  Laterality: Bilateral;   Family History  Problem Relation Age of Onset  . Cancer Other     great grandmother-breast cancer  . Cancer Maternal Aunt     breast cancer  . Diabetes Maternal Grandmother   . Diabetes Maternal Grandfather    Social History  Substance Use Topics  . Smoking status: Current Every Day Smoker -- 0.50 packs/day for 15 years    Types: Cigarettes  . Smokeless tobacco: Never Used  . Alcohol Use: No   OB History    Gravida Para Term  Preterm AB TAB SAB Ectopic Multiple Living   4 3 3  1  1   3      Review of Systems  Constitutional: Negative.   HENT: Negative.   Respiratory: Negative.   Cardiovascular: Negative.   Gastrointestinal: Positive for abdominal pain.  Musculoskeletal: Negative.   Skin: Negative.   Neurological: Negative.   Psychiatric/Behavioral: Negative.   All other systems reviewed and are negative.     Allergies  Shellfish allergy; Codeine; Penicillins; and Tylenol  Home Medications   Prior to Admission medications   Medication Sig Start Date End Date Taking? Authorizing Provider  ALPRAZolam Duanne Moron) 0.5 MG tablet Take 1 tablet (0.5 mg total) by mouth 2 (two) times daily. 05/22/13  Yes Fredia Sorrow, MD  bisacodyl (DULCOLAX) 10 MG suppository Place 1 suppository (10 mg total) rectally daily as needed for moderate constipation. 03/26/16  Yes Florian Buff, MD  hydrochlorothiazide (HYDRODIURIL) 25 MG tablet Take 25 mg by mouth daily.   Yes Historical Provider, MD  HYDROmorphone (DILAUDID) 2 MG tablet Take 1-2 tablets (2-4 mg total) by mouth every 4 (four) hours as needed for severe pain. 03/26/16  Yes Florian Buff, MD  metroNIDAZOLE (FLAGYL) 500 MG tablet Take 1 tablet (500 mg total) by mouth 2 (two) times daily. 03/15/16  Yes Francine Graven, DO  ondansetron (ZOFRAN) 8 MG tablet Take 1 tablet (8 mg total) by mouth every 6 (six)  hours as needed for nausea. 03/26/16  Yes Florian Buff, MD  traZODone (DESYREL) 50 MG tablet Take 50 mg by mouth at bedtime.  03/12/16  Yes Historical Provider, MD  citalopram (CELEXA) 10 MG tablet Take 10 mg by mouth daily as needed (for depression).     Historical Provider, MD  ketorolac (TORADOL) 10 MG tablet Take 1 tablet (10 mg total) by mouth every 8 (eight) hours as needed. Patient not taking: Reported on 03/28/2016 03/26/16   Florian Buff, MD   BP 102/46 mmHg  Pulse 87  Temp(Src) 99.2 F (37.3 C) (Oral)  Resp 26  Ht 5\' 5"  (1.651 m)  Wt 209 lb (94.802 kg)  BMI 34.78  kg/m2  SpO2 95%  LMP 03/13/2016 (Exact Date) Physical Exam  Constitutional: She appears well-developed and well-nourished. She appears distressed.  Appears uncomfortable  HENT:  Head: Normocephalic and atraumatic.  Eyes: Conjunctivae are normal. Pupils are equal, round, and reactive to light.  Neck: Neck supple. No tracheal deviation present. No thyromegaly present.  Cardiovascular: Normal rate and regular rhythm.   No murmur heard. Pulmonary/Chest: Effort normal and breath sounds normal.  Abdominal: Soft. Bowel sounds are normal. She exhibits no distension and no mass. There is tenderness. There is no rebound and no guarding.  Clean surgical wound at suprapubic area. Obese tender at suprapubic and infraumbilical area  Musculoskeletal: Normal range of motion. She exhibits no edema or tenderness.  Neurological: She is alert. Coordination normal.  Skin: Skin is warm and dry. No rash noted.  Psychiatric: She has a normal mood and affect.  Nursing note and vitals reviewed.   ED Course  Procedures (including critical care time) Labs Review Labs Reviewed - No data to display  Imaging Review No results found. I have personally reviewed and evaluated these images and lab results as part of my medical decision-making.   EKG Interpretation None     11:25 PM pain is controlled after treatment with intravenous opioids. Results for orders placed or performed during the hospital encounter of 03/28/16  Urinalysis, Routine w reflex microscopic (not at [facility])  Result Value Ref Range   Color, Urine YELLOW YELLOW   APPearance CLEAR CLEAR   Specific Gravity, Urine <1.005 (L) 1.005 - 1.030   pH 8.0 5.0 - 8.0   Glucose, UA NEGATIVE NEGATIVE mg/dL   Hgb urine dipstick SMALL (A) NEGATIVE   Bilirubin Urine NEGATIVE NEGATIVE   Ketones, ur TRACE (A) NEGATIVE mg/dL   Protein, ur TRACE (A) NEGATIVE mg/dL   Nitrite NEGATIVE NEGATIVE   Leukocytes, UA NEGATIVE NEGATIVE  CBC with  Differential/Platelet  Result Value Ref Range   WBC 4.6 4.0 - 10.5 K/uL   RBC 3.87 3.87 - 5.11 MIL/uL   Hemoglobin 11.2 (L) 12.0 - 15.0 g/dL   HCT 34.0 (L) 36.0 - 46.0 %   MCV 87.9 78.0 - 100.0 fL   MCH 28.9 26.0 - 34.0 pg   MCHC 32.9 30.0 - 36.0 g/dL   RDW 13.9 11.5 - 15.5 %   Platelets 190 150 - 400 K/uL   Neutrophils Relative % 58 %   Neutro Abs 2.6 1.7 - 7.7 K/uL   Lymphocytes Relative 30 %   Lymphs Abs 1.4 0.7 - 4.0 K/uL   Monocytes Relative 9 %   Monocytes Absolute 0.4 0.1 - 1.0 K/uL   Eosinophils Relative 3 %   Eosinophils Absolute 0.1 0.0 - 0.7 K/uL   Basophils Relative 0 %   Basophils Absolute 0.0 0.0 - 0.1  K/uL  Basic metabolic panel  Result Value Ref Range   Sodium 138 135 - 145 mmol/L   Potassium 3.5 3.5 - 5.1 mmol/L   Chloride 103 101 - 111 mmol/L   CO2 26 22 - 32 mmol/L   Glucose, Bld 99 65 - 99 mg/dL   BUN 6 6 - 20 mg/dL   Creatinine, Ser 0.75 0.44 - 1.00 mg/dL   Calcium 9.0 8.9 - 10.3 mg/dL   GFR calc non Af Amer >60 >60 mL/min   GFR calc Af Amer >60 >60 mL/min   Anion gap 9 5 - 15  Urine microscopic-add on  Result Value Ref Range   Squamous Epithelial / LPF 6-30 (A) NONE SEEN   WBC, UA 0-5 0 - 5 WBC/hpf   RBC / HPF 0-5 0 - 5 RBC/hpf   Bacteria, UA RARE (A) NONE SEEN   Ct Abdomen Pelvis W Contrast  03/28/2016  CLINICAL DATA:  Postoperative pain.  Hysterectomy three days ago. EXAM: CT ABDOMEN AND PELVIS WITH CONTRAST TECHNIQUE: Multidetector CT imaging of the abdomen and pelvis was performed using the standard protocol following bolus administration of intravenous contrast. CONTRAST:  148mL ISOVUE-300 IOPAMIDOL (ISOVUE-300) INJECTION 61% COMPARISON:  Ultrasound 10/15/2015 FINDINGS: There are unremarkable appearances of the subcutaneous tissues of the lower abdomen, with only minimal stranding. There is mild edema and thickening of the rectus abdominus musculature in the low abdomen, particularly on the left, reaching a maximal thickness of 3.5 cm. This may  represent a small volume of intramuscular hemorrhage or edema. However, no drainable hematoma is evident in the abdominal wall. In the hysterectomy bed, there also is mildly prominent soft tissue consistent with edema and/or hemorrhage but overall volume is rather low and there is no discrete drainable collection. A few small bubbles of extraluminal gas are present in the abdomen, typical for the recent procedure. There is prior cholecystectomy. The liver and bile ducts are unremarkable. The pancreas, spleen, adrenals and kidneys are unremarkable. There is no hydronephrosis. Ureters are unremarkable. Urinary bladder is unremarkable. Bowel is unremarkable. The abdominal aorta is normal in caliber. There is no atherosclerotic calcification. There is no adenopathy in the abdomen or pelvis. Lung bases are clear. IMPRESSION: Somewhat prominent and slightly disorganized soft tissues in the hysterectomy bed consistent with a small volume of edema or hemorrhage, but these are typical postoperative appearances for recent hysterectomy. There is no drainable collection in the hysterectomy bed. There is moderate asymmetric thickening of the left rectus abdominus muscle which may represent postoperative edema or intramuscular hemorrhage, but there also is no drainable collection in the abdominal wall or subcutaneous tissues. No evidence of disruption of bowel or ureter. Electronically Signed   By: Andreas Newport M.D.   On: 03/28/2016 22:01    MDM  Case discussed with Dr.Eure and we will prescribe Percocet in place of hydromorphone. She can also take ibuprofen 800 mg 3 times daily for pain Follow-up with Dr.Eure in the office on 04/01/2016 Dx postoperative pain Final diagnoses:  None         Orlie Dakin, MD 03/28/16 [place], MD 03/28/16 2339

## 2016-03-28 NOTE — ED Notes (Signed)
She had a hysterectomy this past Wednesday.  She has been in a lot of pain since the surgery.  She has pain medication, but it is not working.  Hurting at the surgical site and into my back per pt.  Dr. Elonda Husky did the surgery.

## 2016-03-28 NOTE — Discharge Instructions (Signed)
Take the Percocet prescribed instead of Dilaudid to help with your pain. You should also take ibuprofen (advil) 800 mg or four 200 mg tablets 3 times daily to help with pain. Call Dr.Eure's office to arrange to be seen on Wednesday, 04/01/2016

## 2016-03-28 NOTE — ED Notes (Signed)
Pt took 2 mg Dilaudid 1H PTA.

## 2016-03-28 NOTE — ED Notes (Signed)
Pt refused in and out cath. EDP informed.

## 2016-03-29 DIAGNOSIS — R1030 Lower abdominal pain, unspecified: Secondary | ICD-10-CM | POA: Diagnosis not present

## 2016-03-31 ENCOUNTER — Telehealth: Payer: Self-pay | Admitting: *Deleted

## 2016-03-31 ENCOUNTER — Ambulatory Visit (INDEPENDENT_AMBULATORY_CARE_PROVIDER_SITE_OTHER): Payer: Medicare Other | Admitting: Obstetrics & Gynecology

## 2016-03-31 ENCOUNTER — Encounter: Payer: Self-pay | Admitting: Obstetrics & Gynecology

## 2016-03-31 VITALS — BP 120/70 | HR 68 | Wt 205.0 lb

## 2016-03-31 DIAGNOSIS — Z9889 Other specified postprocedural states: Secondary | ICD-10-CM

## 2016-03-31 MED ORDER — ESTRADIOL 2 MG PO TABS: 2.0000 mg | ORAL_TABLET | Freq: Every day | ORAL | Status: DC

## 2016-03-31 MED ORDER — OXYCODONE-ACETAMINOPHEN 5-325 MG PO TABS: 1.0000 | ORAL_TABLET | Freq: Four times a day (QID) | ORAL | Status: DC | PRN

## 2016-03-31 MED ORDER — ALPRAZOLAM 0.5 MG PO TABS: 0.5000 mg | ORAL_TABLET | Freq: Two times a day (BID) | ORAL | Status: DC

## 2016-03-31 NOTE — Telephone Encounter (Signed)
Pt states she is unable to sleep, Dilauded not helping with pain, "just miserable." Pt given an appt today with Dr.Eure for evaluation.

## 2016-03-31 NOTE — Progress Notes (Signed)
Patient ID: Rachel Ray, female   DOB: 05/05/1981, 35 y.o.   MRN: [id number]  HPI: Patient returns for routine postoperative follow-up having undergone TAH/BSO on 03/25/2016.  The patient's immediate postoperative recovery has been unremarkable. Since hospital discharge the patient reports I was aware that the patient went to the emergency room the other night for pain. Despite the fact that she responded to the doll audit in the hospital she states is not working well at time was placed and on oxycodone She states that is working well for her or at least better I talked with her extensively preoperatively about her low pain threshold and she is been on narcotics from other physicians at different times for different problems I was very frank with her about her narcotic use and my concern about using that excessively  Current Outpatient Prescriptions: ALPRAZolam (XANAX) 0.5 MG tablet, Take 1 tablet (0.5 mg total) by mouth 2 (two) times daily., Disp: 60 tablet, Rfl: 0 bisacodyl (DULCOLAX) 10 MG suppository, Place 1 suppository (10 mg total) rectally daily as needed for moderate constipation., Disp: 12 suppository, Rfl: 0 citalopram (CELEXA) 10 MG tablet, Take 10 mg by mouth daily as needed (for depression). , Disp: , Rfl:  hydrochlorothiazide (HYDRODIURIL) 25 MG tablet, Take 25 mg by mouth daily., Disp: , Rfl:  ondansetron (ZOFRAN) 8 MG tablet, Take 1 tablet (8 mg total) by mouth every 6 (six) hours as needed for nausea., Disp: 20 tablet, Rfl: 0 oxyCODONE-acetaminophen (PERCOCET) 5-325 MG tablet, Take 1-2 tablets by mouth every 6 (six) hours as needed., Disp: 30 tablet, Rfl: 0 traZODone (DESYREL) 50 MG tablet, Take 50 mg by mouth at bedtime. , Disp: , Rfl:  estradiol (ESTRACE) 2 MG tablet, Take 1 tablet (2 mg total) by mouth daily., Disp: 30 tablet, Rfl: 11  No current facility-administered medications for this visit.    Blood pressure 120/70, pulse 68, weight 205 lb (92.987 kg), last  menstrual period 03/13/2016.  Physical Exam: Abdomen soft some bruising incision is clean dry and intact benign postop abdomen  Diagnostic Tests:   Pathology: Benign  Impression: Postop from a TAH/BSO Patient concern regarding pain management  Plan: As above I discussed pain management sensibly with patient the patient was discharged home and I'll all did which I don't usually do because of it being more potent but felt like patient needed it She states she's not taken the dye [place] have to trust her on that she is given a prescription for Percocet to take at home which much last 2 weeks She cannot request a refill prior to 2 weeks when I see her again  Her postoperative evaluation is completely normal this represents a normal postoperative course  Follow up: 2  weeks  Florian Buff, MD  Meds ordered this encounter  Medications  . ALPRAZolam (XANAX) 0.5 MG tablet    Sig: Take 1 tablet (0.5 mg total) by mouth 2 (two) times daily.    Dispense:  60 tablet    Refill:  0  . oxyCODONE-acetaminophen (PERCOCET) 5-325 MG tablet    Sig: Take 1-2 tablets by mouth every 6 (six) hours as needed.    Dispense:  30 tablet    Refill:  0  . estradiol (ESTRACE) 2 MG tablet    Sig: Take 1 tablet (2 mg total) by mouth daily.    Dispense:  30 tablet    Refill:  11

## 2016-04-07 ENCOUNTER — Encounter: Payer: Medicare Other | Admitting: Obstetrics & Gynecology

## 2016-04-14 ENCOUNTER — Encounter: Payer: Self-pay | Admitting: Obstetrics & Gynecology

## 2016-04-14 ENCOUNTER — Ambulatory Visit (INDEPENDENT_AMBULATORY_CARE_PROVIDER_SITE_OTHER): Payer: Medicare Other | Admitting: Obstetrics & Gynecology

## 2016-04-14 VITALS — BP 110/80 | HR 72 | Wt 199.0 lb

## 2016-04-14 DIAGNOSIS — Z09 Encounter for follow-up examination after completed treatment for conditions other than malignant neoplasm: Secondary | ICD-10-CM

## 2016-04-14 DIAGNOSIS — Z90710 Acquired absence of both cervix and uterus: Secondary | ICD-10-CM

## 2016-04-14 MED ORDER — ESTRADIOL 2 MG PO TABS: ORAL_TABLET | ORAL | Status: DC

## 2016-04-14 MED ORDER — ALPRAZOLAM 0.5 MG PO TABS: 0.5000 mg | ORAL_TABLET | Freq: Two times a day (BID) | ORAL | Status: DC

## 2016-04-14 MED ORDER — OXYCODONE-ACETAMINOPHEN 5-325 MG PO TABS: 1.0000 | ORAL_TABLET | Freq: Four times a day (QID) | ORAL | Status: DC | PRN

## 2016-04-14 NOTE — Progress Notes (Signed)
Patient ID: Rachel Ray, female   DOB: [birth year], 35 y.o.   MRN: [id number]  HPI: Patient returns for routine postoperative follow-up having undergone TAHBSO  on 03/25/2016.  The patient's immediate postoperative recovery has been unremarkable. Since hospital discharge the patient reports no problems.   Current Outpatient Prescriptions: ALPRAZolam (XANAX) 0.5 MG tablet, Take 1 tablet (0.5 mg total) by mouth 2 (two) times daily., Disp: 60 tablet, Rfl: 0 citalopram (CELEXA) 10 MG tablet, Take 10 mg by mouth daily as needed (for depression). , Disp: , Rfl:  estradiol (ESTRACE) 2 MG tablet, 1 tablet twice daily, Disp: 60 tablet, Rfl: 11 hydrochlorothiazide (HYDRODIURIL) 25 MG tablet, Take 25 mg by mouth daily., Disp: , Rfl:  ondansetron (ZOFRAN) 8 MG tablet, Take 1 tablet (8 mg total) by mouth every 6 (six) hours as needed for nausea., Disp: 20 tablet, Rfl: 0 oxyCODONE-acetaminophen (PERCOCET) 5-325 MG tablet, Take 1-2 tablets by mouth every 6 (six) hours as needed., Disp: 30 tablet, Rfl: 0 traZODone (DESYREL) 50 MG tablet, Take 50 mg by mouth at bedtime. , Disp: , Rfl:   No current facility-administered medications for this visit.    Blood pressure 110/80, pulse 72, weight 199 lb (90.266 kg), last menstrual period 03/13/2016.  Physical Exam: Incision clean dry intact  Diagnostic Tests: none  Pathology: benign  Impression: S/p TAHBSO  Plan: No vaginal intercourse  Follow up:   Return in about 3 weeks (around 05/05/2016) for [place], with Dr Elonda Husky.   Florian Buff, MD  Meds ordered this encounter  Medications  . estradiol (ESTRACE) 2 MG tablet    Sig: 1 tablet twice daily    Dispense:  60 tablet    Refill:  11  . ALPRAZolam (XANAX) 0.5 MG tablet    Sig: Take 1 tablet (0.5 mg total) by mouth 2 (two) times daily.    Dispense:  60 tablet    Refill:  0  . oxyCODONE-acetaminophen (PERCOCET) 5-325 MG tablet    Sig: Take 1-2 tablets by mouth every 6 (six) hours as needed.   Dispense:  30 tablet    Refill:  0

## 2016-04-15 ENCOUNTER — Telehealth: Payer: Self-pay | Admitting: Obstetrics & Gynecology

## 2016-04-15 MED ORDER — ALPRAZOLAM 0.5 MG PO TABS: 0.5000 mg | ORAL_TABLET | Freq: Three times a day (TID) | ORAL | Status: DC | PRN

## 2016-04-15 NOTE — Telephone Encounter (Signed)
Pt informed Xanax prescription left at front desk, no more refills on Xanax per Dr.Eure. Pt verbalized understanding.

## 2016-04-15 NOTE — Telephone Encounter (Signed)
Pt states pharmacy will not fill RX for Xanax, not able to get filled until 04/29/16. Pt requesting also to get RX for Xanax TID instead of BID because she wakes up at night feeling like she is having panic attack. Please advise.

## 2016-05-05 ENCOUNTER — Encounter: Payer: Medicare Other | Admitting: Obstetrics & Gynecology

## 2016-05-08 ENCOUNTER — Encounter: Payer: Medicare Other | Admitting: Obstetrics & Gynecology

## 2016-05-18 ENCOUNTER — Other Ambulatory Visit (HOSPITAL_COMMUNITY): Payer: Self-pay | Admitting: Family Medicine

## 2016-05-18 DIAGNOSIS — E049 Nontoxic goiter, unspecified: Secondary | ICD-10-CM

## 2016-05-20 ENCOUNTER — Ambulatory Visit (HOSPITAL_COMMUNITY): Admission: RE | Admit: 2016-05-20 | Payer: Medicare Other | Source: Ambulatory Visit

## 2016-05-20 ENCOUNTER — Other Ambulatory Visit (HOSPITAL_COMMUNITY): Payer: Self-pay | Admitting: Family Medicine

## 2016-05-21 ENCOUNTER — Ambulatory Visit (HOSPITAL_COMMUNITY)
Admission: RE | Admit: 2016-05-21 | Discharge: 2016-05-21 | Disposition: A | Discharge status: Home / Self Care | Payer: Medicare Other | Source: Ambulatory Visit | Attending: Family Medicine | Admitting: Family Medicine

## 2016-05-21 DIAGNOSIS — E049 Nontoxic goiter, unspecified: Secondary | ICD-10-CM

## 2016-06-02 ENCOUNTER — Other Ambulatory Visit (HOSPITAL_COMMUNITY): Payer: Self-pay | Admitting: Family Medicine

## 2016-06-02 DIAGNOSIS — E041 Nontoxic single thyroid nodule: Secondary | ICD-10-CM

## 2016-06-17 ENCOUNTER — Emergency Department (HOSPITAL_COMMUNITY)
Admission: EM | Admit: 2016-06-17 | Discharge: 2016-06-18 | Disposition: A | Discharge status: Home / Self Care | Payer: Medicare Other | Attending: Emergency Medicine | Admitting: Emergency Medicine

## 2016-06-17 DIAGNOSIS — F17210 Nicotine dependence, cigarettes, uncomplicated: Secondary | ICD-10-CM | POA: Diagnosis not present

## 2016-06-17 DIAGNOSIS — R200 Anesthesia of skin: Secondary | ICD-10-CM | POA: Diagnosis not present

## 2016-06-17 DIAGNOSIS — R1084 Generalized abdominal pain: Secondary | ICD-10-CM | POA: Diagnosis present

## 2016-06-17 DIAGNOSIS — Z79899 Other long term (current) drug therapy: Secondary | ICD-10-CM | POA: Insufficient documentation

## 2016-06-17 DIAGNOSIS — K5901 Slow transit constipation: Secondary | ICD-10-CM | POA: Diagnosis not present

## 2016-06-17 DIAGNOSIS — R1110 Vomiting, unspecified: Secondary | ICD-10-CM | POA: Diagnosis not present

## 2016-06-17 DIAGNOSIS — I10 Essential (primary) hypertension: Secondary | ICD-10-CM | POA: Diagnosis not present

## 2016-06-17 NOTE — ED Triage Notes (Signed)
Pt arrives via EMS from home with c/o n/v and abdominal pain for 3 days. Pt also c/o bilateral leg pain and numbness in both hands en route to the ED, hx of anxiety.

## 2016-06-18 ENCOUNTER — Encounter (HOSPITAL_COMMUNITY): Payer: Self-pay | Admitting: *Deleted

## 2016-06-18 ENCOUNTER — Emergency Department (HOSPITAL_COMMUNITY): Payer: Medicare Other

## 2016-06-18 DIAGNOSIS — K5901 Slow transit constipation: Secondary | ICD-10-CM | POA: Diagnosis not present

## 2016-06-18 LAB — CBC
HEMATOCRIT: 37.6 % (ref 36.0–46.0)
HEMOGLOBIN: 12.4 g/dL (ref 12.0–15.0)
MCH: 29.1 pg (ref 26.0–34.0)
MCHC: 33 g/dL (ref 30.0–36.0)
MCV: 88.3 fL (ref 78.0–100.0)
Platelets: 205 10*3/uL (ref 150–400)
RBC: 4.26 MIL/uL (ref 3.87–5.11)
RDW: 13.4 % (ref 11.5–15.5)
WBC: 3.9 10*3/uL — AB (ref 4.0–10.5)

## 2016-06-18 LAB — CBC WITH DIFFERENTIAL/PLATELET
BASOS ABS: 0 10*3/uL (ref 0.0–0.1)
Basophils Relative: 1 %
Eosinophils Absolute: 0.1 10*3/uL (ref 0.0–0.7)
Eosinophils Relative: 1 %
LYMPHS PCT: 47 %
Lymphs Abs: 2 10*3/uL (ref 0.7–4.0)
MONO ABS: 0.3 10*3/uL (ref 0.1–1.0)
MONOS PCT: 7 %
NEUTROS ABS: 1.8 10*3/uL (ref 1.7–7.7)
Neutrophils Relative %: 44 %

## 2016-06-18 LAB — URINALYSIS, ROUTINE W REFLEX MICROSCOPIC
Bilirubin Urine: NEGATIVE
GLUCOSE, UA: NEGATIVE mg/dL
Hgb urine dipstick: NEGATIVE
KETONES UR: NEGATIVE mg/dL
LEUKOCYTES UA: NEGATIVE
Nitrite: NEGATIVE
PH: 8.5 — AB (ref 5.0–8.0)
Protein, ur: NEGATIVE mg/dL
Specific Gravity, Urine: 1.015 (ref 1.005–1.030)

## 2016-06-18 LAB — COMPREHENSIVE METABOLIC PANEL
ALBUMIN: 4.3 g/dL (ref 3.5–5.0)
ALT: 11 U/L — AB (ref 14–54)
AST: 21 U/L (ref 15–41)
Alkaline Phosphatase: 62 U/L (ref 38–126)
Anion gap: 8 (ref 5–15)
BUN: 11 mg/dL (ref 6–20)
CHLORIDE: 110 mmol/L (ref 101–111)
CO2: 20 mmol/L — AB (ref 22–32)
CREATININE: 0.76 mg/dL (ref 0.44–1.00)
Calcium: 9.5 mg/dL (ref 8.9–10.3)
GFR calc Af Amer: >60 mL/min (ref >60)
GFR calc non Af Amer: >60 mL/min (ref >60)
GLUCOSE: 111 mg/dL — AB (ref 65–99)
POTASSIUM: 3 mmol/L — AB (ref 3.5–5.1)
SODIUM: 138 mmol/L (ref 135–145)
Total Bilirubin: 0.4 mg/dL (ref 0.3–1.2)
Total Protein: 8.3 g/dL — ABNORMAL HIGH (ref 6.5–8.1)

## 2016-06-18 LAB — LIPASE, BLOOD: LIPASE: 17 U/L (ref 11–51)

## 2016-06-18 MED ORDER — HYDROMORPHONE HCL 1 MG/ML IJ SOLN
1.0000 mg | Freq: Once | INTRAMUSCULAR | Status: AC
  Administered 2016-06-18: 1 mg via INTRAVENOUS
  Filled 2016-06-18: qty 1

## 2016-06-18 MED ORDER — PROMETHAZINE HCL 25 MG PO TABS: 25.0000 mg | ORAL_TABLET | Freq: Four times a day (QID) | ORAL | 0 refills | Status: DC | PRN

## 2016-06-18 MED ORDER — DIATRIZOATE MEGLUMINE & SODIUM 66-10 % PO SOLN
ORAL | Status: AC
  Filled 2016-06-18: qty 30

## 2016-06-18 MED ORDER — IOPAMIDOL (ISOVUE-300) INJECTION 61%
100.0000 mL | Freq: Once | INTRAVENOUS | Status: AC | PRN
  Administered 2016-06-18: 100 mL via INTRAVENOUS

## 2016-06-18 MED ORDER — POLYETHYLENE GLYCOL 3350 17 G PO PACK: 17.0000 g | PACK | Freq: Every day | ORAL | 0 refills | Status: DC

## 2016-06-18 MED ORDER — SODIUM CHLORIDE 0.9 % IV BOLUS (SEPSIS)
500.0000 mL | Freq: Once | INTRAVENOUS | Status: AC
  Administered 2016-06-18: 500 mL via INTRAVENOUS

## 2016-06-18 MED ORDER — DICYCLOMINE HCL 20 MG PO TABS: 20.0000 mg | ORAL_TABLET | Freq: Two times a day (BID) | ORAL | 0 refills | Status: DC

## 2016-06-18 MED ORDER — ONDANSETRON HCL 4 MG/2ML IJ SOLN
4.0000 mg | Freq: Once | INTRAMUSCULAR | Status: AC
  Administered 2016-06-18: 4 mg via INTRAVENOUS
  Filled 2016-06-18: qty 2

## 2016-06-18 MED ORDER — MAGNESIUM CITRATE PO SOLN: 1.0000 | Freq: Once | ORAL | 0 refills | Status: AC

## 2016-06-18 NOTE — ED Provider Notes (Addendum)
[place] DEPT Provider Note   CSN: [id number] Arrival date & time: 06/17/16  2349  First Provider Contact: 06/18/2016 12:02 AM  By signing my name below, I, Georgette Shell, attest that this documentation has been prepared under the direction and in the presence of Orpah Greek, MD. Electronically Signed: Georgette Shell, ED Scribe. 06/18/16. 12:08 AM.    History   Chief Complaint Chief Complaint  Patient presents with  . Abdominal Pain    HPI Comments: Rachel Ray is a 35 y.o. female with h/o anxiety who presents to the Emergency Department by EMS complaining of sudden onset, constant, 10/10, lower abdominal pain onset three days ago but worsening tonight. Pt also has associated nausea, vomiting, bilateral leg pain, and numbness in bilateral hands. No alleviating factors noted. Pt has h/o chronic pelvic pain and states that these symptoms are similar. Pt denies dysuria, hematuria, diarrhea, vaginal discharge/bleeding, or any other associated symptoms.   The history is provided by the patient. No language interpreter was used.    Past Medical History:  Diagnosis Date  . Anemia   . Anxiety   . Arthritis    rheumatoid  . Chronic pelvic pain in female   . Depression   . Dysmenorrhea   . Hypertension   . Menorrhagia   . Uterine fibroid     Patient Active Problem List   Diagnosis Date Noted  . S/P hysterectomy 03/25/2016    Past Surgical History:  Procedure Laterality Date  . ABDOMINAL HYSTERECTOMY N/A 03/25/2016   Procedure: HYSTERECTOMY ABDOMINAL;  Surgeon: Florian Buff, MD;  Location: [facility];  Service: Gynecology;  Laterality: N/A;  . ADENOIDECTOMY    . CESAREAN SECTION     x 3  . CHOLECYSTECTOMY    . SALPINGOOPHORECTOMY Bilateral 03/25/2016   Procedure: SALPINGO OOPHORECTOMY;  Surgeon: Florian Buff, MD;  Location: [facility];  Service: Gynecology;  Laterality: Bilateral;  . TONSILLECTOMY    . TUBAL LIGATION      OB History    Gravida Para Term Preterm AB Living    4 3 3   1 3    SAB TAB Ectopic Multiple Live Births   1               Home Medications    Prior to Admission medications   Medication Sig Start Date End Date Taking? Authorizing Provider  ALPRAZolam Duanne Moron) 0.5 MG tablet Take 1 tablet (0.5 mg total) by mouth 3 (three) times daily as needed for anxiety. 04/15/16   Florian Buff, MD  citalopram (CELEXA) 10 MG tablet Take 10 mg by mouth daily as needed (for depression).     Historical Provider, MD  estradiol (ESTRACE) 2 MG tablet 1 tablet twice daily 04/14/16   Florian Buff, MD  hydrochlorothiazide (HYDRODIURIL) 25 MG tablet Take 25 mg by mouth daily.    Historical Provider, MD  ondansetron (ZOFRAN) 8 MG tablet Take 1 tablet (8 mg total) by mouth every 6 (six) hours as needed for nausea. 03/26/16   Florian Buff, MD  oxyCODONE-acetaminophen (PERCOCET) 5-325 MG tablet Take 1-2 tablets by mouth every 6 (six) hours as needed. 04/14/16   Florian Buff, MD  traZODone (DESYREL) 50 MG tablet Take 50 mg by mouth at bedtime.  03/12/16   Historical Provider, MD    Family History Family History  Problem Relation Age of Onset  . Diabetes Maternal Grandmother   . Diabetes Maternal Grandfather   . Cancer Other  great grandmother-breast cancer  . Cancer Maternal Aunt     breast cancer    Social History Social History  Substance Use Topics  . Smoking status: Current Every Day Smoker    Packs/day: 0.50    Years: 15.00    Types: Cigarettes  . Smokeless tobacco: Never Used  . Alcohol use No     Allergies   Shellfish allergy; Codeine; Penicillins; and Tylenol [acetaminophen]   Review of Systems Review of Systems  Constitutional: Negative for fever.  Gastrointestinal: Positive for abdominal pain, nausea and vomiting. Negative for diarrhea.  Genitourinary: Negative for dysuria, hematuria, vaginal bleeding and vaginal discharge.  Musculoskeletal: Positive for arthralgias.  Neurological: Positive for numbness.     Physical Exam Updated  Vital Signs BP 103/73 (BP Location: Right Arm)   Pulse (!) 57   Temp 98.1 F (36.7 C) (Oral)   Resp 18   Ht 5\' 2"  (1.575 m)   Wt 192 lb (87.1 kg)   LMP 03/13/2016 (Exact Date)   SpO2 100%   BMI 35.12 kg/m   Physical Exam  Constitutional: She is oriented to person, place, and time. She appears well-developed and well-nourished. No distress.  HENT:  Head: Normocephalic and atraumatic.  Right Ear: Hearing normal.  Left Ear: Hearing normal.  Nose: Nose normal.  Mouth/Throat: Oropharynx is clear and moist and mucous membranes are normal.  Eyes: Conjunctivae and EOM are normal. Pupils are equal, round, and reactive to light.  Neck: Normal range of motion. Neck supple.  Cardiovascular: Regular rhythm, S1 normal and S2 normal.  Exam reveals no gallop and no friction rub.   No murmur heard. Pulmonary/Chest: Effort normal and breath sounds normal. No respiratory distress. She exhibits no tenderness.  Abdominal: Soft. Normal appearance and bowel sounds are normal. There is no hepatosplenomegaly. There is tenderness. There is no rebound, no guarding, no tenderness at McBurney's point and negative Murphy's sign. No hernia.  Diffuse tenderness.  Musculoskeletal: Normal range of motion.  Neurological: She is alert and oriented to person, place, and time. She has normal strength. No cranial nerve deficit or sensory deficit. Coordination normal. GCS eye subscore is 4. GCS verbal subscore is 5. GCS motor subscore is 6.  Skin: Skin is warm, dry and intact. No rash noted. No cyanosis.  Psychiatric: She has a normal mood and affect. Her speech is normal and behavior is normal. Thought content normal.  Nursing note and vitals reviewed.    ED Treatments / Results  DIAGNOSTIC STUDIES: Oxygen Saturation is 100% on RA, normal by my interpretation.    COORDINATION OF CARE: 12:06 AM Discussed treatment plan with pt at bedside which includes lab work and pt agreed to plan.  Labs (all labs ordered are  listed, but only abnormal results are displayed) Labs Reviewed  COMPREHENSIVE METABOLIC PANEL - Abnormal; Notable for the following:       Result Value   Potassium 3.0 (*)    CO2 20 (*)    Glucose, Bld 111 (*)    Total Protein 8.3 (*)    ALT 11 (*)    All other components within normal limits  CBC - Abnormal; Notable for the following:    WBC 3.9 (*)    All other components within normal limits  LIPASE, BLOOD  CBC WITH DIFFERENTIAL/PLATELET  URINALYSIS, ROUTINE W REFLEX MICROSCOPIC (NOT AT [facility])    EKG  EKG Interpretation None       Radiology Dg Abd Acute W/chest  Result Date: 06/18/2016 CLINICAL DATA:  Sharp abdominal pain for the past several hours. EXAM: DG ABDOMEN ACUTE W/ 1V CHEST COMPARISON:  CT abdomen pelvis - 03/28/2016 ; chest radiograph - 08/02/2012 FINDINGS: Grossly unchanged cardiac silhouette and mediastinal contours. No focal airspace opacities. No pleural effusion or pneumothorax. No evidence of edema. Large colonic stool burden without evidence of enteric obstruction. No pneumoperitoneum, pneumatosis or portal venous gas. Multiple phleboliths overlie the lower pelvis bilaterally. Otherwise, no definitive abnormal intra-abdominal calcifications given overlying colonic stool burden. Post cholecystectomy. No acute osseus abnormalities. IMPRESSION: 1.  No acute cardiopulmonary disease. 2. Large colonic stool burden without evidence of enteric obstruction. Electronically Signed   By: Sandi Mariscal M.D.   On: 06/18/2016 01:05   Procedures Procedures (including critical care time)  Medications Ordered in ED Medications  HYDROmorphone (DILAUDID) injection 1 mg (1 mg Intravenous Given 06/18/16 0032)  ondansetron (ZOFRAN) injection 4 mg (4 mg Intravenous Given 06/18/16 0032)  sodium chloride 0.9 % bolus 500 mL (500 mLs Intravenous New Bag/Given 06/18/16 0032)     Initial Impression / Assessment and Plan / ED Course  I have reviewed the triage vital signs and the nursing  notes.  Pertinent labs & imaging results that were available during my care of the patient were reviewed by me and considered in my medical decision making (see chart for details).  Clinical Course  Abdominal pain Constipation  Patient presents to the emergency department with complaints of abdominal pain. Reviewing her records reveals that she does have a history of chronic pelvic pain. She did, however, undergo total abdominal hysterectomy 2 months ago. Because of this, full workup was performed. She has normal labs including no leukocytosis. Chemistries are normal. LFTs are normal. Lipase was normal. CT abdomen and pelvis performed, no acute abnormality noted other than constipation.  Final Clinical Impressions(s) / ED Diagnoses   Final diagnoses:  Preterm contractions, third trimester    New Prescriptions New Prescriptions   No medications on file   I personally performed the services described in this documentation, which was scribed in my presence. The recorded information has been reviewed and is accurate.     Orpah Greek, MD 06/18/16 [place], MD 06/18/16 [phone]

## 2016-06-18 NOTE — ED Notes (Signed)
Patient transported to CT 

## 2016-06-18 NOTE — ED Notes (Signed)
Patient transported to X-ray via stretcher 

## 2016-06-18 NOTE — ED Notes (Signed)
MD at bedside. 

## 2016-06-25 ENCOUNTER — Inpatient Hospital Stay: Admission: RE | Admit: 2016-06-25 | Payer: Medicare Other | Source: Ambulatory Visit

## 2016-06-30 ENCOUNTER — Inpatient Hospital Stay: Admission: RE | Admit: 2016-06-30 | Payer: Medicare Other | Source: Ambulatory Visit

## 2016-07-07 ENCOUNTER — Inpatient Hospital Stay: Admission: RE | Admit: 2016-07-07 | Payer: Medicare Other | Source: Ambulatory Visit

## 2016-07-21 ENCOUNTER — Other Ambulatory Visit (HOSPITAL_COMMUNITY)
Admission: RE | Admit: 2016-07-21 | Discharge: 2016-07-21 | Disposition: A | Discharge status: Home / Self Care | Payer: Medicare Other | Source: Ambulatory Visit | Attending: Physician Assistant | Admitting: Physician Assistant

## 2016-07-21 ENCOUNTER — Ambulatory Visit
Admission: RE | Admit: 2016-07-21 | Discharge: 2016-07-21 | Disposition: A | Discharge status: Home / Self Care | Payer: Medicare Other | Source: Ambulatory Visit | Attending: Family Medicine | Admitting: Family Medicine

## 2016-07-21 DIAGNOSIS — E041 Nontoxic single thyroid nodule: Secondary | ICD-10-CM | POA: Insufficient documentation

## 2016-07-21 NOTE — Procedures (Signed)
Using direct ultrasound guidance, 4 passes were made using 25 g needles into the nodule within the right lobe of the thyroid.   Ultrasound was used to confirm needle placements on all occasions.   Specimens were sent to Pathology for analysis.  Judie Grieve Derral Colucci PA-C 07/21/2016 3:42 PM

## 2016-08-07 ENCOUNTER — Emergency Department (HOSPITAL_COMMUNITY)
Admission: EM | Admit: 2016-08-07 | Discharge: 2016-08-07 | Disposition: A | Discharge status: Home / Self Care | Payer: Medicare Other | Attending: Emergency Medicine | Admitting: Emergency Medicine

## 2016-08-07 ENCOUNTER — Encounter (HOSPITAL_COMMUNITY): Payer: Self-pay

## 2016-08-07 DIAGNOSIS — H65191 Other acute nonsuppurative otitis media, right ear: Secondary | ICD-10-CM | POA: Diagnosis not present

## 2016-08-07 DIAGNOSIS — F17210 Nicotine dependence, cigarettes, uncomplicated: Secondary | ICD-10-CM | POA: Insufficient documentation

## 2016-08-07 DIAGNOSIS — Z8639 Personal history of other endocrine, nutritional and metabolic disease: Secondary | ICD-10-CM

## 2016-08-07 DIAGNOSIS — Z79899 Other long term (current) drug therapy: Secondary | ICD-10-CM | POA: Diagnosis not present

## 2016-08-07 DIAGNOSIS — J029 Acute pharyngitis, unspecified: Secondary | ICD-10-CM | POA: Diagnosis not present

## 2016-08-07 DIAGNOSIS — M542 Cervicalgia: Secondary | ICD-10-CM | POA: Insufficient documentation

## 2016-08-07 DIAGNOSIS — Z85850 Personal history of malignant neoplasm of thyroid: Secondary | ICD-10-CM | POA: Diagnosis not present

## 2016-08-07 DIAGNOSIS — H9203 Otalgia, bilateral: Secondary | ICD-10-CM | POA: Diagnosis present

## 2016-08-07 DIAGNOSIS — H6591 Unspecified nonsuppurative otitis media, right ear: Secondary | ICD-10-CM

## 2016-08-07 LAB — CBC WITH DIFFERENTIAL/PLATELET
Basophils Absolute: 0 10*3/uL (ref 0.0–0.1)
Basophils Relative: 0 %
Eosinophils Absolute: 0.2 10*3/uL (ref 0.0–0.7)
Eosinophils Relative: 4 %
HEMATOCRIT: 40.2 % (ref 36.0–46.0)
HEMOGLOBIN: 13.5 g/dL (ref 12.0–15.0)
LYMPHS ABS: 2.1 10*3/uL (ref 0.7–4.0)
LYMPHS PCT: 41 %
MCH: 30.3 pg (ref 26.0–34.0)
MCHC: 33.6 g/dL (ref 30.0–36.0)
MCV: 90.1 fL (ref 78.0–100.0)
MONO ABS: 0.3 10*3/uL (ref 0.1–1.0)
MONOS PCT: 6 %
NEUTROS ABS: 2.5 10*3/uL (ref 1.7–7.7)
NEUTROS PCT: 49 %
Platelets: 189 10*3/uL (ref 150–400)
RBC: 4.46 MIL/uL (ref 3.87–5.11)
RDW: 13 % (ref 11.5–15.5)
WBC: 5.2 10*3/uL (ref 4.0–10.5)

## 2016-08-07 LAB — BASIC METABOLIC PANEL
ANION GAP: 9 (ref 5–15)
BUN: 10 mg/dL (ref 6–20)
CALCIUM: 9.9 mg/dL (ref 8.9–10.3)
CHLORIDE: 100 mmol/L — AB (ref 101–111)
CO2: 29 mmol/L (ref 22–32)
Creatinine, Ser: 0.93 mg/dL (ref 0.44–1.00)
GFR calc non Af Amer: >60 mL/min (ref >60)
GLUCOSE: 100 mg/dL — AB (ref 65–99)
Potassium: 3.3 mmol/L — ABNORMAL LOW (ref 3.5–5.1)
Sodium: 138 mmol/L (ref 135–145)

## 2016-08-07 MED ORDER — PSEUDOEPHEDRINE HCL ER 120 MG PO TB12: 120.0000 mg | ORAL_TABLET | Freq: Two times a day (BID) | ORAL | 0 refills | Status: DC

## 2016-08-07 MED ORDER — OXYCODONE-ACETAMINOPHEN 5-325 MG PO TABS
1.0000 | ORAL_TABLET | Freq: Once | ORAL | Status: AC
  Administered 2016-08-07: 1 via ORAL
  Filled 2016-08-07: qty 1

## 2016-08-07 MED ORDER — NAPROXEN 500 MG PO TABS: 500.0000 mg | ORAL_TABLET | Freq: Two times a day (BID) | ORAL | 0 refills | Status: DC

## 2016-08-07 NOTE — Discharge Instructions (Signed)
Follow-up with your doctor on Monday.  Try taking the decongestant as directed.  Stop use if your blood pressure becomes elevated.

## 2016-08-07 NOTE — ED Provider Notes (Signed)
[place] DEPT Provider Note   CSN: [id number] Arrival date & time: 08/07/16  2102     History   Chief Complaint Chief Complaint  Patient presents with  . Sore Throat    HPI Rachel Ray is a 35 y.o. female.  HPI   Rachel Ray is a 35 y.o. female who presents to the Emergency Department complaining of foreign body sensation with swallowing, neck pain, and bilateral ear pain for several weeks.  She states that she had a needle biopsy of a thyroid nodule on 07/21/16 and her symptoms have been present before the biopsy, but have worsened since the procedure.  She also states the she has ran out of her percocet and cannot see her PMD until Monday.  She reports associated pain with swallowing.  She denies fever, chills, vomiting, and chest pain.     Past Medical History:  Diagnosis Date  . Anemia   . Anxiety   . Arthritis    rheumatoid  . Chronic pelvic pain in female   . Depression   . Dysmenorrhea   . Hypertension   . Menorrhagia   . Uterine fibroid     Patient Active Problem List   Diagnosis Date Noted  . S/P hysterectomy 03/25/2016    Past Surgical History:  Procedure Laterality Date  . ABDOMINAL HYSTERECTOMY N/A 03/25/2016   Procedure: HYSTERECTOMY ABDOMINAL;  Surgeon: Florian Buff, MD;  Location: [facility];  Service: Gynecology;  Laterality: N/A;  . ADENOIDECTOMY    . CESAREAN SECTION     x 3  . CHOLECYSTECTOMY    . SALPINGOOPHORECTOMY Bilateral 03/25/2016   Procedure: SALPINGO OOPHORECTOMY;  Surgeon: Florian Buff, MD;  Location: [facility];  Service: Gynecology;  Laterality: Bilateral;  . TONSILLECTOMY    . TUBAL LIGATION      OB History    Gravida Para Term Preterm AB Living   4 3 3   1 3    SAB TAB Ectopic Multiple Live Births   1               Home Medications    Prior to Admission medications   Medication Sig Start Date End Date Taking? Authorizing Provider  ALPRAZolam Duanne Moron) 1 MG tablet Take 1 mg by mouth 3 (three) times daily as needed for  anxiety.   Yes Historical Provider, MD  escitalopram (LEXAPRO) 10 MG tablet Take 10 mg by mouth daily. 06/18/16  Yes Historical Provider, MD  estradiol (ESTRACE) 2 MG tablet 1 tablet twice daily Patient taking differently: Take 2 mg by mouth 2 (two) times daily.  04/14/16  Yes Florian Buff, MD  hydrochlorothiazide (HYDRODIURIL) 25 MG tablet Take 25 mg by mouth daily as needed (for high blood pressure levels).    Yes Historical Provider, MD  ondansetron (ZOFRAN) 8 MG tablet Take 1 tablet (8 mg total) by mouth every 6 (six) hours as needed for nausea. 03/26/16  Yes Florian Buff, MD  oxyCODONE-acetaminophen (PERCOCET) 5-325 MG tablet Take 1-2 tablets by mouth every 6 (six) hours as needed. Patient taking differently: Take 1-2 tablets by mouth every 6 (six) hours as needed for moderate pain or severe pain.  04/14/16  Yes Florian Buff, MD  traZODone (DESYREL) 100 MG tablet Take 100 mg by mouth at bedtime.  03/12/16  Yes Historical Provider, MD  dicyclomine (BENTYL) 20 MG tablet Take 1 tablet (20 mg total) by mouth 2 (two) times daily. Patient not taking: Reported on 08/07/2016 06/18/16   Harrell Gave  Hewitt Shorts, MD  polyethylene glycol (MIRALAX / GLYCOLAX) packet Take 17 g by mouth daily. Patient not taking: Reported on 08/07/2016 06/18/16   Orpah Greek, MD  promethazine (PHENERGAN) 25 MG tablet Take 1 tablet (25 mg total) by mouth every 6 (six) hours as needed for nausea or vomiting. Patient not taking: Reported on 08/07/2016 06/18/16   Orpah Greek, MD    Family History Family History  Problem Relation Age of Onset  . Diabetes Maternal Grandmother   . Diabetes Maternal Grandfather   . Cancer Other     great grandmother-breast cancer  . Cancer Maternal Aunt     breast cancer    Social History Social History  Substance Use Topics  . Smoking status: Current Every Day Smoker    Packs/day: 0.50    Years: 15.00    Types: Cigarettes  . Smokeless tobacco: Never Used  . Alcohol use No      Allergies   Shellfish allergy and Penicillins   Review of Systems Review of Systems  Constitutional: Negative for activity change, appetite change, chills and fever.  HENT: Positive for ear pain and sore throat. Negative for congestion, facial swelling, hearing loss, sinus pressure, trouble swallowing and voice change.   Eyes: Negative for pain and visual disturbance.  Respiratory: Negative for cough and shortness of breath.   Cardiovascular: Negative for chest pain.  Gastrointestinal: Negative for abdominal pain, nausea and vomiting.  Genitourinary: Negative for dysuria.  Musculoskeletal: Positive for neck pain. Negative for arthralgias and neck stiffness.  Skin: Negative for color change and rash.  Neurological: Negative for dizziness, facial asymmetry, speech difficulty, numbness and headaches.  Hematological: Negative for adenopathy.  Psychiatric/Behavioral: Negative for confusion.  All other systems reviewed and are negative.    Physical Exam Updated Vital Signs BP 121/93 (BP Location: Left Arm)   Pulse 72   Temp 97.9 F (36.6 C) (Oral)   Resp 16   Ht 5\' 2"  (1.575 m)   Wt 87.1 kg   LMP 03/13/2016 (Exact Date)   SpO2 100%   BMI 35.12 kg/m   Physical Exam  Constitutional: She is oriented to person, place, and time. She appears well-developed and well-nourished. No distress.  HENT:  Head: Normocephalic and atraumatic.  Mouth/Throat: Uvula is midline, oropharynx is clear and moist and mucous membranes are normal. No trismus in the jaw. No tonsillar abscesses.  Right middle ear effusion.  Loss of landmarks   Eyes: EOM are normal. Pupils are equal, round, and reactive to light.  Neck: Trachea normal, normal range of motion, full passive range of motion without pain and phonation normal. Neck supple. No tracheal deviation present. No Kernig's sign noted. Thyroid mass present.    Palpable nodule to the right thyroid  Cardiovascular: Normal rate, regular rhythm and  intact distal pulses.   Pulmonary/Chest: Effort normal and breath sounds normal. No stridor. No respiratory distress.  Musculoskeletal: Normal range of motion.  Lymphadenopathy:    She has no cervical adenopathy.  Neurological: She is alert and oriented to person, place, and time. No cranial nerve deficit.  Skin: Skin is warm. No rash noted.  Psychiatric: She has a normal mood and affect.  Nursing note and vitals reviewed.    ED Treatments / Results  Labs (all labs ordered are listed, but only abnormal results are displayed) Labs Reviewed  BASIC METABOLIC PANEL - Abnormal; Notable for the following:       Result Value   Potassium 3.3 (*)  Chloride 100 (*)    Glucose, Bld 100 (*)    All other components within normal limits  CBC WITH DIFFERENTIAL/PLATELET    EKG  EKG Interpretation None       Radiology No results found.  Procedures Procedures (including critical care time)  Medications Ordered in ED Medications  oxyCODONE-acetaminophen (PERCOCET/ROXICET) 5-325 MG per tablet 1 tablet (not administered)     Initial Impression / Assessment and Plan / ED Course  I have reviewed the triage vital signs and the nursing notes.  Pertinent labs & imaging results that were available during my care of the patient were reviewed by me and considered in my medical decision making (see chart for details).  Clinical Course    Pt well appearing.  Non-toxic.  Airway is patent.  Handles her secretions well.  Speech clear.  Labs show mild hypokalemia, pt takes HCTZ, hx of same.    She appears stable for d/c.  No concerning sx's for infectious process.  Pt has PMD appt for Monday.  Will tx with just 2-3 days of decongestant since pt has hx of HTN and she agrees to d/c if BP becomes highly elevated.    Final Clinical Impressions(s) / ED Diagnoses   Final diagnoses:  Middle ear effusion, right  History of thyroid nodule    New Prescriptions New Prescriptions   No medications  on file     Bufford Lope 08/07/16 [place], MD 08/08/16 [phone]

## 2016-08-07 NOTE — ED Triage Notes (Signed)
Patient states she had recent thyroid biopsy last week and yesterday started having throat pain, bilateral ear pain and dizziness.

## 2016-09-16 ENCOUNTER — Ambulatory Visit: Payer: Medicare Other | Admitting: "Endocrinology

## 2016-10-20 ENCOUNTER — Ambulatory Visit (INDEPENDENT_AMBULATORY_CARE_PROVIDER_SITE_OTHER): Payer: Medicare Other | Admitting: "Endocrinology

## 2016-10-20 ENCOUNTER — Encounter: Payer: Self-pay | Admitting: "Endocrinology

## 2016-10-20 VITALS — BP 137/83 | HR 62 | Ht 62.0 in | Wt 200.0 lb

## 2016-10-20 DIAGNOSIS — E042 Nontoxic multinodular goiter: Secondary | ICD-10-CM | POA: Insufficient documentation

## 2016-10-20 NOTE — Progress Notes (Signed)
Subjective:    Patient ID: Rachel Ray, female    DOB: 21-Aug-1981, PCP Robert Bellow, MD   Past Medical History:  Diagnosis Date  . Anemia   . Anxiety   . Arthritis    rheumatoid  . Chronic pelvic pain in female   . Depression   . Dysmenorrhea   . Hypertension   . Menorrhagia   . Uterine fibroid    Past Surgical History:  Procedure Laterality Date  . ABDOMINAL HYSTERECTOMY N/A 03/25/2016   Procedure: HYSTERECTOMY ABDOMINAL;  Surgeon: Florian Buff, MD;  Location: [facility];  Service: Gynecology;  Laterality: N/A;  . ADENOIDECTOMY    . CESAREAN SECTION     x 3  . CHOLECYSTECTOMY    . SALPINGOOPHORECTOMY Bilateral 03/25/2016   Procedure: SALPINGO OOPHORECTOMY;  Surgeon: Florian Buff, MD;  Location: [facility];  Service: Gynecology;  Laterality: Bilateral;  . TONSILLECTOMY    . TUBAL LIGATION     Social History   Social History  . Marital status: Legally Separated    Spouse name: N/A  . Number of children: N/A  . Years of education: N/A   Social History Main Topics  . Smoking status: Current Every Day Smoker    Packs/day: 0.50    Years: 15.00    Types: Cigarettes  . Smokeless tobacco: Never Used  . Alcohol use No  . Drug use: No  . Sexual activity: Yes    Birth control/ protection: Surgical   Other Topics Concern  . None   Social History Narrative  . None   Outpatient Encounter Prescriptions as of 10/20/2016  Medication Sig  . ALPRAZolam (XANAX) 1 MG tablet Take 1 mg by mouth 3 (three) times daily as needed for anxiety.  . diazepam (VALIUM) 10 MG tablet Take 10 mg by mouth at bedtime.  Marland Kitchen escitalopram (LEXAPRO) 10 MG tablet Take 10 mg by mouth daily.  Marland Kitchen estradiol (ESTRACE) 2 MG tablet 1 tablet twice daily (Patient taking differently: Take 2 mg by mouth 2 (two) times daily. )  . hydrochlorothiazide (HYDRODIURIL) 25 MG tablet Take 25 mg by mouth daily as needed (for high blood pressure levels).   . naproxen (NAPROSYN) 500 MG tablet Take 1 tablet (500 mg  total) by mouth 2 (two) times daily with a meal.  . Potassium Chloride Crys ER (KLOR-CON M10 PO) Take by mouth.  . traMADol (ULTRAM) 50 MG tablet Take 50 mg by mouth every 6 (six) hours as needed.  . traZODone (DESYREL) 100 MG tablet Take 100 mg by mouth at bedtime.   . [DISCONTINUED] dicyclomine (BENTYL) 20 MG tablet Take 1 tablet (20 mg total) by mouth 2 (two) times daily. (Patient not taking: Reported on 08/07/2016)  . [DISCONTINUED] ondansetron (ZOFRAN) 8 MG tablet Take 1 tablet (8 mg total) by mouth every 6 (six) hours as needed for nausea.  . [DISCONTINUED] oxyCODONE-acetaminophen (PERCOCET) 5-325 MG tablet Take 1-2 tablets by mouth every 6 (six) hours as needed. (Patient taking differently: Take 1-2 tablets by mouth every 6 (six) hours as needed for moderate pain or severe pain. )  . [DISCONTINUED] polyethylene glycol (MIRALAX / GLYCOLAX) packet Take 17 g by mouth daily. (Patient not taking: Reported on 08/07/2016)  . [DISCONTINUED] promethazine (PHENERGAN) 25 MG tablet Take 1 tablet (25 mg total) by mouth every 6 (six) hours as needed for nausea or vomiting. (Patient not taking: Reported on 08/07/2016)  . [DISCONTINUED] pseudoephedrine (SUDAFED 12 HOUR) 120 MG 12 hr tablet Take 1  tablet (120 mg total) by mouth 2 (two) times daily.   No facility-administered encounter medications on file as of 10/20/2016.    ALLERGIES: VACCINATION STATUS:  There is no immunization history on file for this patient.  HPI  35 year old female patient with medical history as above. She is being seen in consultation for multinodular goiter requested by Dr. Leslie Andrea. - She is known to have multinodular goiter for a few years. She underwent thyroid ultrasound in June 2017 which showed to nodules on the right lobe and 1 nodule on the left lobe. One of the right lobe nodule was 2.6 cm and subjected to find needle aspiration in 06/24/2016 with benign outcomes. She is not on thyroid hormone. She did not require  any surgical therapy at that time. She does not have recent thyroid function test to review. She complains of diffuse body pain including her neck back and front. She has family history of multinodular goiter in her mother and one of her grandparents. She has 3 grown children. - She denies any shortness of breath, difficulty swallowing, nor voice change. She denies heat nor cold intolerance. She has a steady weight gain of 8 pounds since July 2017. - She is a chronic smoker. - She is status post hysterectomy.  Review of Systems Constitutional:  Progressive weight gain of 8 pounds since July 2017, + fatigue, no subjective hyperthermia/hypothermia Eyes: no blurry vision, no xerophthalmia ENT: no sore throat, no nodules palpated in throat, no dysphagia/odynophagia, no hoarseness Cardiovascular: no CP/SOB/palpitations/leg swelling Respiratory:  + cough/SOB Gastrointestinal: no N/V/D/C Musculoskeletal:  +  Diffuse muscle/joint aches Skin: no rashes Neurological: no tremors/numbness/tingling/dizziness Psychiatric: + depression, +anxiety  Objective:    BP 137/83   Pulse 62   Ht 5\' 2"  (1.575 m)   Wt 200 lb (90.7 kg)   LMP 03/13/2016 (Exact Date)   BMI 36.58 kg/m   Wt Readings from Last 3 Encounters:  10/20/16 200 lb (90.7 kg)  08/07/16 192 lb (87.1 kg)  06/18/16 192 lb (87.1 kg)    Physical Exam  Constitutional: overweight, in NAD Eyes: PERRLA, EOMI, no exophthalmos ENT: moist mucous membranes, + nodular thyromegaly, n0 gross cervical lymphadenopathy Cardiovascular: RRR, No MRG Respiratory: CTA B Gastrointestinal: abdomen soft, NT, ND, BS+ Musculoskeletal: no deformities, strength intact in all 4 Skin: moist, warm, no rashes Neurological: no tremor with outstretched hands, DTR normal in all 4  CMP     Component Value Date/Time   NA 138 08/07/2016 2215   K 3.3 (L) 08/07/2016 2215   CL 100 (L) 08/07/2016 2215   CO2 29 08/07/2016 2215   GLUCOSE 100 (H) 08/07/2016 2215   BUN  10 08/07/2016 2215   CREATININE 0.93 08/07/2016 2215   CALCIUM 9.9 08/07/2016 2215   PROT 8.3 (H) 06/18/2016 0010   ALBUMIN 4.3 06/18/2016 0010   AST 21 06/18/2016 0010   ALT 11 (L) 06/18/2016 0010   ALKPHOS 62 06/18/2016 0010   BILITOT 0.4 06/18/2016 0010   GFRNONAA >60 08/07/2016 2215   GFRAA >60 08/07/2016 2215    I reviewed ultrasound from June 2017 showing right lobe 5.600 m with 2 nodules (2.6 cm and 1.4 cm-the 2.6 cm nodule was biopsied); left lobe 5.9 cm with 1 nodule 1.4 cm.  No thyroid function test in the records to review    Assessment & Plan:   1. Multinodular goiter -  This patient is being seen in at a kind request of Dr. Karie Kirks . I have reviewed her available records  and clinically evaluated this patient. She has euthyroid multinodular goiter which was worked up completely and recent past. - Since her FNA of the largest nodule on the right lobe was benign in August 2017, she will not need any further intervention at this time. - However, she will need repeat thyroid/neck ultrasound in 6 month to monitor the size of the rest of the nodules (1.4 cm nodule on the right lobe and another 1.4 cm nodule on the left lobe). I did not order this  imaging study today. She will have full profile thyroid function test today and will return in one week for reevaluation.  - If she has thyroid dysfunction ,  Options of therapy will be discussed with her. The diffuse pain she is complaining of on her neck, cervical spine, and joints is unlikely to be related to her  multinodular goiter .  - I advised patient to maintain close follow up with Robert Bellow, MD for primary care needs. Follow up plan: Return in about 1 week (around 10/27/2016) for labs today.  Glade Lloyd, MD Phone: [phone]  Fax: [phone]   10/20/2016, 3:16 PM

## 2016-10-27 ENCOUNTER — Ambulatory Visit (INDEPENDENT_AMBULATORY_CARE_PROVIDER_SITE_OTHER): Payer: Medicare Other | Admitting: "Endocrinology

## 2016-10-27 ENCOUNTER — Encounter: Payer: Self-pay | Admitting: "Endocrinology

## 2016-10-27 VITALS — BP 137/81 | HR 78 | Ht 62.0 in | Wt 197.0 lb

## 2016-10-27 DIAGNOSIS — E042 Nontoxic multinodular goiter: Secondary | ICD-10-CM

## 2016-10-27 LAB — THYROID PEROXIDASE ANTIBODY: THYROID PEROXIDASE ANTIBODY: 3 [IU]/mL (ref <9)

## 2016-10-27 LAB — THYROGLOBULIN ANTIBODY

## 2016-10-27 LAB — TSH: TSH: 2.48 mIU/L

## 2016-10-27 NOTE — Progress Notes (Signed)
Subjective:    Patient ID: Rachel Ray, female    DOB: [birth year], PCP Robert Bellow, MD   Past Medical History:  Diagnosis Date  . Anemia   . Anxiety   . Arthritis    rheumatoid  . Chronic pelvic pain in female   . Depression   . Dysmenorrhea   . Hypertension   . Menorrhagia   . Uterine fibroid    Past Surgical History:  Procedure Laterality Date  . ABDOMINAL HYSTERECTOMY N/A 03/25/2016   Procedure: HYSTERECTOMY ABDOMINAL;  Surgeon: Florian Buff, MD;  Location: [facility];  Service: Gynecology;  Laterality: N/A;  . ADENOIDECTOMY    . CESAREAN SECTION     x 3  . CHOLECYSTECTOMY    . SALPINGOOPHORECTOMY Bilateral 03/25/2016   Procedure: SALPINGO OOPHORECTOMY;  Surgeon: Florian Buff, MD;  Location: [facility];  Service: Gynecology;  Laterality: Bilateral;  . TONSILLECTOMY    . TUBAL LIGATION     Social History   Social History  . Marital status: Legally Separated    Spouse name: N/A  . Number of children: N/A  . Years of education: N/A   Social History Main Topics  . Smoking status: Current Every Day Smoker    Packs/day: 0.50    Years: 15.00    Types: Cigarettes  . Smokeless tobacco: Never Used  . Alcohol use No  . Drug use: No  . Sexual activity: Yes    Birth control/ protection: Surgical   Other Topics Concern  . None   Social History Narrative  . None   Outpatient Encounter Prescriptions as of 10/27/2016  Medication Sig  . ALPRAZolam (XANAX) 1 MG tablet Take 1 mg by mouth 3 (three) times daily as needed for anxiety.  . diazepam (VALIUM) 10 MG tablet Take 10 mg by mouth at bedtime.  Marland Kitchen escitalopram (LEXAPRO) 10 MG tablet Take 10 mg by mouth daily.  Marland Kitchen estradiol (ESTRACE) 2 MG tablet 1 tablet twice daily (Patient taking differently: Take 2 mg by mouth 2 (two) times daily. )  . hydrochlorothiazide (HYDRODIURIL) 25 MG tablet Take 25 mg by mouth daily as needed (for high blood pressure levels).   . naproxen (NAPROSYN) 500 MG tablet Take 1 tablet (500 mg  total) by mouth 2 (two) times daily with a meal.  . Potassium Chloride Crys ER (KLOR-CON M10 PO) Take by mouth.  . traMADol (ULTRAM) 50 MG tablet Take 50 mg by mouth every 6 (six) hours as needed.  . traZODone (DESYREL) 100 MG tablet Take 100 mg by mouth at bedtime.    No facility-administered encounter medications on file as of 10/27/2016.    ALLERGIES: VACCINATION STATUS:  There is no immunization history on file for this patient.  HPI  35 year old female patient with medical history as above. She is being seen in f/u for multinodular goiter requested by Dr. Leslie Andrea. - She is known to have multinodular goiter for a few years. She underwent thyroid ultrasound in June 2017 which showed to nodules on the right lobe and 1 nodule on the left lobe. One of the right lobe nodule was 2.6 cm and subjected to fine needle aspiration in 06/24/2016 with benign outcomes. She is not on thyroid hormone. She did not require any surgical therapy at that time.   - Her recent thyroid function tests are consistent with euthyroid state. - She complains of diffuse body pain including her neck back and front. She has family history of multinodular goiter in  her mother and one of her grandparents. She has 3 grown children. - She denies any shortness of breath, difficulty swallowing, nor voice change. She denies heat nor cold intolerance. She has a steady weight gain of 8 pounds since July 2017. - She is a chronic smoker. - She is status post hysterectomy.  Review of Systems Constitutional:  She lost 3 pounds since last visit, + fatigue, no subjective hyperthermia/hypothermia Eyes: no blurry vision, no xerophthalmia ENT: no sore throat, no nodules palpated in throat, no dysphagia/odynophagia, no hoarseness Cardiovascular: no CP/SOB/palpitations/leg swelling Respiratory:  + cough/SOB Gastrointestinal: no N/V/D/C Musculoskeletal:  +  Diffuse muscle/joint aches Skin: no rashes Neurological: no  tremors/numbness/tingling/dizziness Psychiatric: + depression, +anxiety  Objective:    BP 137/81   Pulse 78   Ht '5\' 2"'  (1.575 m)   Wt 197 lb (89.4 kg)   LMP 03/13/2016 (Exact Date)   BMI 36.03 kg/m   Wt Readings from Last 3 Encounters:  10/27/16 197 lb (89.4 kg)  10/20/16 200 lb (90.7 kg)  08/07/16 192 lb (87.1 kg)    Physical Exam  Constitutional: overweight, in NAD Eyes: PERRLA, EOMI, no exophthalmos ENT: moist mucous membranes, + nodular thyromegaly, n0 gross cervical lymphadenopathy Cardiovascular: RRR, No MRG Respiratory: CTA B Gastrointestinal: abdomen soft, NT, ND, BS+ Musculoskeletal: no deformities, strength intact in all 4 Skin: moist, warm, no rashes Neurological: no tremor with outstretched hands, DTR normal in all 4  CMP     Component Value Date/Time   NA 138 08/07/2016 2215   K 3.3 (L) 08/07/2016 2215   CL 100 (L) 08/07/2016 2215   CO2 29 08/07/2016 2215   GLUCOSE 100 (H) 08/07/2016 2215   BUN 10 08/07/2016 2215   CREATININE 0.93 08/07/2016 2215   CALCIUM 9.9 08/07/2016 2215   PROT 8.3 (H) 06/18/2016 0010   ALBUMIN 4.3 06/18/2016 0010   AST 21 06/18/2016 0010   ALT 11 (L) 06/18/2016 0010   ALKPHOS 62 06/18/2016 0010   BILITOT 0.4 06/18/2016 0010   GFRNONAA >60 08/07/2016 2215   GFRAA >60 08/07/2016 2215    I reviewed ultrasound from June 2017 showing right lobe 5.600 m with 2 nodules (2.6 cm and 1.4 cm-the 2.6 cm nodule was biopsied); left lobe 5.9 cm with 1 nodule 1.4 cm.  Recent Results (from the past 2160 hour(s))  Basic metabolic panel     Status: Abnormal   Collection Time: 08/07/16 10:15 PM  Result Value Ref Range   Sodium 138 135 - 145 mmol/L   Potassium 3.3 (L) 3.5 - 5.1 mmol/L   Chloride 100 (L) 101 - 111 mmol/L   CO2 29 22 - 32 mmol/L   Glucose, Bld 100 (H) 65 - 99 mg/dL   BUN 10 6 - 20 mg/dL   Creatinine, Ser 0.93 0.44 - 1.00 mg/dL   Calcium 9.9 8.9 - 10.3 mg/dL   GFR calc non Af Amer >60 >60 mL/min   GFR calc Af Amer >60 >60  mL/min    Comment: (NOTE) The eGFR has been calculated using the CKD EPI equation. This calculation has not been validated in all clinical situations. eGFR's persistently <60 mL/min signify possible Chronic Kidney Disease.    Anion gap 9 5 - 15  CBC with Differential     Status: None   Collection Time: 08/07/16 10:15 PM  Result Value Ref Range   WBC 5.2 4.0 - 10.5 K/uL   RBC 4.46 3.87 - 5.11 MIL/uL   Hemoglobin 13.5 12.0 - 15.0 g/dL  HCT 40.2 36.0 - 46.0 %   MCV 90.1 78.0 - 100.0 fL   MCH 30.3 26.0 - 34.0 pg   MCHC 33.6 30.0 - 36.0 g/dL   RDW 13.0 11.5 - 15.5 %   Platelets 189 150 - 400 K/uL   Neutrophils Relative % 49 %   Neutro Abs 2.5 1.7 - 7.7 K/uL   Lymphocytes Relative 41 %   Lymphs Abs 2.1 0.7 - 4.0 K/uL   Monocytes Relative 6 %   Monocytes Absolute 0.3 0.1 - 1.0 K/uL   Eosinophils Relative 4 %   Eosinophils Absolute 0.2 0.0 - 0.7 K/uL   Basophils Relative 0 %   Basophils Absolute 0.0 0.0 - 0.1 K/uL  Thyroid peroxidase antibody     Status: None   Collection Time: 10/26/16  2:12 PM  Result Value Ref Range   Thyroperoxidase Ab SerPl-aCnc 3 <9 IU/mL  Thyroglobulin antibody     Status: None   Collection Time: 10/26/16  2:12 PM  Result Value Ref Range   Thyroglobulin Ab <1 <2 IU/mL  TSH     Status: None   Collection Time: 10/26/16  2:12 PM  Result Value Ref Range   TSH 2.48 mIU/L    Comment:   Reference Range   > or = 20 Years  0.40-4.50   Pregnancy Range First trimester  0.26-2.66 Second trimester 0.55-2.73 Third trimester  0.43-2.91          Assessment & Plan:   1. Multinodular goiter -  Her repeat thyroid function tests are consistent with euthyroid state. She would not need any thyroid hormone supplement, at this time. - Since her FNA of the largest nodule on the right lobe was benign in August 2017, she will not need any further intervention at this time. - However, she will need repeat thyroid/neck ultrasound in 6 month to monitor the size of  the rest of the nodules (1.4 cm nodule on the right lobe and another 1.4 cm nodule on the left lobe). I did not order this  imaging study today. She will have full profile thyroid function test today and will return in one week for reevaluation.  - The diffuse pain she is complaining of on her neck, cervical spine, and joints is unlikely to be related to her  multinodular goiter .  - I advised patient to maintain close follow up with Robert Bellow, MD for primary care needs. Follow up plan: Return in about 6 months (around 04/27/2017) for Thyroid / Neck Ultrasound.  Glade Lloyd, MD Phone: [phone]  Fax: [phone]   10/27/2016, 3:13 PM

## 2016-11-05 ENCOUNTER — Ambulatory Visit (INDEPENDENT_AMBULATORY_CARE_PROVIDER_SITE_OTHER): Payer: Medicare Other | Admitting: Otolaryngology

## 2016-12-08 ENCOUNTER — Other Ambulatory Visit (HOSPITAL_COMMUNITY): Payer: Self-pay | Admitting: Family Medicine

## 2016-12-08 ENCOUNTER — Ambulatory Visit (HOSPITAL_COMMUNITY)
Admission: RE | Admit: 2016-12-08 | Discharge: 2016-12-08 | Disposition: A | Discharge status: Home / Self Care | Payer: Medicare Other | Source: Ambulatory Visit | Attending: Family Medicine | Admitting: Family Medicine

## 2016-12-08 DIAGNOSIS — M25562 Pain in left knee: Principal | ICD-10-CM

## 2016-12-08 DIAGNOSIS — M25561 Pain in right knee: Secondary | ICD-10-CM | POA: Insufficient documentation

## 2016-12-25 ENCOUNTER — Other Ambulatory Visit: Payer: Self-pay | Admitting: Obstetrics & Gynecology

## 2017-02-03 ENCOUNTER — Emergency Department (HOSPITAL_COMMUNITY)
Admission: EM | Admit: 2017-02-03 | Discharge: 2017-02-03 | Disposition: A | Discharge status: Home / Self Care | Payer: Medicare Other | Attending: Emergency Medicine | Admitting: Emergency Medicine

## 2017-02-03 ENCOUNTER — Encounter (HOSPITAL_COMMUNITY): Payer: Self-pay | Admitting: Emergency Medicine

## 2017-02-03 ENCOUNTER — Emergency Department (HOSPITAL_COMMUNITY): Payer: Medicare Other

## 2017-02-03 DIAGNOSIS — M25561 Pain in right knee: Secondary | ICD-10-CM | POA: Diagnosis present

## 2017-02-03 DIAGNOSIS — F17210 Nicotine dependence, cigarettes, uncomplicated: Secondary | ICD-10-CM | POA: Diagnosis not present

## 2017-02-03 DIAGNOSIS — Z79899 Other long term (current) drug therapy: Secondary | ICD-10-CM | POA: Diagnosis not present

## 2017-02-03 DIAGNOSIS — G8929 Other chronic pain: Secondary | ICD-10-CM | POA: Diagnosis not present

## 2017-02-03 DIAGNOSIS — I10 Essential (primary) hypertension: Secondary | ICD-10-CM | POA: Insufficient documentation

## 2017-02-03 MED ORDER — KETOROLAC TROMETHAMINE 30 MG/ML IJ SOLN: 30.0000 mg | Freq: Once | INTRAMUSCULAR | Status: DC

## 2017-02-03 MED ORDER — OXYCODONE-ACETAMINOPHEN 5-325 MG PO TABS
1.0000 | ORAL_TABLET | Freq: Once | ORAL | Status: AC
  Administered 2017-02-03: 1 via ORAL
  Filled 2017-02-03: qty 1

## 2017-02-03 NOTE — ED Provider Notes (Signed)
[place] DEPT Provider Note   CSN: [id number] Arrival date & time: 02/03/17  2017     History   Chief Complaint Chief Complaint  Patient presents with  . Knee Pain    HPI Rachel Ray is a 36 y.o. female.  The history is provided by the patient. No language interpreter was used.  Knee Pain   This is a chronic problem. Episode onset: 3 months. The problem occurs constantly. The problem has been gradually worsening. The pain is present in the right knee. The quality of the pain is described as aching. The pain is severe. She has tried nothing for the symptoms. The treatment provided no relief. There has been a history of trauma.  Pt reports she hit her knee over kerosene heater in December.  Pt reports she has had pain since.  Pt reports she has been seeing Dr. Karie Kirks for and pain is worse.  Pt reports he has been giving her tramadol but feels she needs something stonger.   Past Medical History:  Diagnosis Date  . Anemia   . Anxiety   . Arthritis    rheumatoid  . Chronic pelvic pain in female   . Depression   . Dysmenorrhea   . Hypertension   . Menorrhagia   . Uterine fibroid     Patient Active Problem List   Diagnosis Date Noted  . Multinodular goiter 10/20/2016  . S/P hysterectomy 03/25/2016    Past Surgical History:  Procedure Laterality Date  . ABDOMINAL HYSTERECTOMY N/A 03/25/2016   Procedure: HYSTERECTOMY ABDOMINAL;  Surgeon: Florian Buff, MD;  Location: [facility];  Service: Gynecology;  Laterality: N/A;  . ADENOIDECTOMY    . CESAREAN SECTION     x 3  . CHOLECYSTECTOMY    . SALPINGOOPHORECTOMY Bilateral 03/25/2016   Procedure: SALPINGO OOPHORECTOMY;  Surgeon: Florian Buff, MD;  Location: [facility];  Service: Gynecology;  Laterality: Bilateral;  . TONSILLECTOMY    . TUBAL LIGATION      OB History    Gravida Para Term Preterm AB Living   4 3 3   1 3    SAB TAB Ectopic Multiple Live Births   1               Home Medications    Prior to Admission  medications   Medication Sig Start Date End Date Taking? Authorizing Provider  ALPRAZolam Duanne Moron) 1 MG tablet Take 1 mg by mouth 3 (three) times daily as needed for anxiety.    Historical Provider, MD  diazepam (VALIUM) 10 MG tablet Take 10 mg by mouth at bedtime.    Historical Provider, MD  escitalopram (LEXAPRO) 10 MG tablet Take 10 mg by mouth daily. 06/18/16   Historical Provider, MD  estradiol (ESTRACE) 2 MG tablet TAKE 1 TABLET BY MOUTH TWICE DAILY 12/26/16   Florian Buff, MD  hydrochlorothiazide (HYDRODIURIL) 25 MG tablet Take 25 mg by mouth daily as needed (for high blood pressure levels).     Historical Provider, MD  naproxen (NAPROSYN) 500 MG tablet Take 1 tablet (500 mg total) by mouth 2 (two) times daily with a meal. 08/07/16   Tammy Triplett, PA-C  Potassium Chloride Crys ER (KLOR-CON M10 PO) Take by mouth.    Historical Provider, MD  traMADol (ULTRAM) 50 MG tablet Take 50 mg by mouth every 6 (six) hours as needed.    Historical Provider, MD  traZODone (DESYREL) 100 MG tablet Take 100 mg by mouth at bedtime.  03/12/16  Historical Provider, MD    Family History Family History  Problem Relation Age of Onset  . Diabetes Maternal Grandmother   . Diabetes Maternal Grandfather   . Cancer Other     great grandmother-breast cancer  . Cancer Maternal Aunt     breast cancer    Social History Social History  Substance Use Topics  . Smoking status: Current Every Day Smoker    Packs/day: 0.50    Years: 15.00    Types: Cigarettes  . Smokeless tobacco: Never Used  . Alcohol use No     Allergies   Shellfish allergy and Penicillins   Review of Systems Review of Systems  All other systems reviewed and are negative.    Physical Exam Updated Vital Signs BP 112/80 (BP Location: Left Arm)   Pulse 68   Temp 97.8 F (36.6 C) (Oral)   Resp 18   Ht 5\' 2"  (1.575 m)   Wt 90.7 kg   LMP 03/13/2016 (Exact Date)   SpO2 97%   BMI 36.58 kg/m   Physical Exam  Constitutional: She  appears well-developed and well-nourished.  Musculoskeletal: She exhibits tenderness. She exhibits no edema or deformity.  no effusion no instability medial or lateral knee,  Negative drawer.   Neurological: She is alert.  Skin: Skin is warm.  Psychiatric: She has a normal mood and affect.  Vitals reviewed.    ED Treatments / Results  Labs (all labs ordered are listed, but only abnormal results are displayed) Labs Reviewed - No data to display  EKG  EKG Interpretation None       Radiology Dg Knee Complete 4 Views Right  Result Date: 02/03/2017 CLINICAL DATA:  36 y/o  F; injury seen. EXAM: RIGHT KNEE - COMPLETE 4+ VIEW COMPARISON:  12/08/2016 right knee radiographs. FINDINGS: No evidence of fracture, dislocation, or joint effusion. No evidence of arthropathy or other focal bone abnormality. Soft tissues are unremarkable. IMPRESSION: Negative. Electronically Signed   By: Kristine Garbe M.D.   On: 02/03/2017 21:34    Procedures Procedures (including critical care time)  Medications Ordered in ED Medications  oxyCODONE-acetaminophen (PERCOCET/ROXICET) 5-325 MG per tablet 1 tablet (not administered)     Initial Impression / Assessment and Plan / ED Course  I have reviewed the triage vital signs and the nursing notes.  Pertinent labs & imaging results that were available during my care of the patient were reviewed by me and considered in my medical decision making (see chart for details).     Pt declined torodol.  Pt reports it gives her a headache. [place] database review shows pt on tramadol and xanax.  Pt denies having a controlled medication contract with Dr. Karie Kirks.  Pt request percocet.  Pt given one dose here.  She is advised to call Dr. Vickey Sages office to discuss pain mangement/referral to orthopaedist   Final Clinical Impressions(s) / ED Diagnoses   Final diagnoses:  Chronic pain of right knee    New Prescriptions New Prescriptions   No medications on  file  knee immbolizer.  An After Visit Summary was printed and given to the patient.   Fransico Meadow, PA-C 02/03/17 2216    Merrily Pew, MD 02/03/17 [phone]

## 2017-02-03 NOTE — ED Triage Notes (Signed)
Injured rt knee x 50month ago,  Seen pcp and was given medication.  States meds are not relieving the pain.

## 2017-03-16 ENCOUNTER — Ambulatory Visit: Payer: Medicare Other | Admitting: General Surgery

## 2017-03-30 ENCOUNTER — Ambulatory Visit (HOSPITAL_COMMUNITY): Admission: RE | Admit: 2017-03-30 | Payer: Medicare Other | Source: Ambulatory Visit

## 2017-04-01 ENCOUNTER — Ambulatory Visit: Payer: Medicare Other | Admitting: General Surgery

## 2017-04-05 ENCOUNTER — Ambulatory Visit: Payer: Medicare Other | Admitting: "Endocrinology

## 2017-05-09 IMAGING — CT CT ABD-PELV W/ CM
2 of 4 series · 16 of 46 positions shown, 18 images · IV contrast (iopamidol)
Comparison: CT dated 03/28/2016

CLINICAL DATA: 35-year-old female with abdominal pain. Recent
hysterectomy.

EXAM:
CT ABDOMEN AND PELVIS WITH CONTRAST
TECHNIQUE: Multidetector CT imaging of the abdomen and pelvis was performed
using the standard protocol following bolus administration of
intravenous contrast.
CONTRAST:  100mL CUMCHH-HEE IOPAMIDOL (CUMCHH-HEE) INJECTION 61%

[Series 2: routine abd pel with · axial · 0.69mm/px · z∈[-464,-50]mm · 13 of 93 slices shown, 15 images]
[im 5/93  soft-tissue]
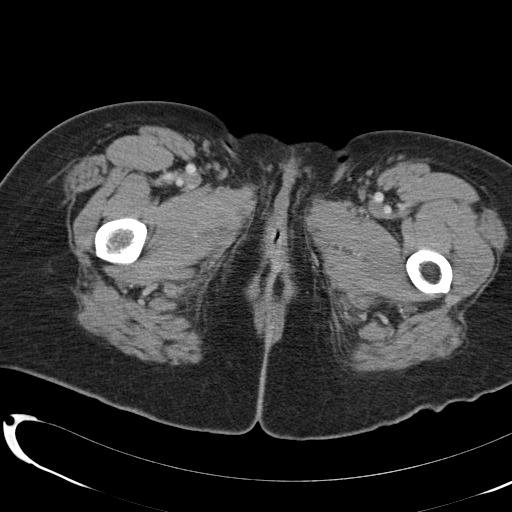
[im 5/93  bone]
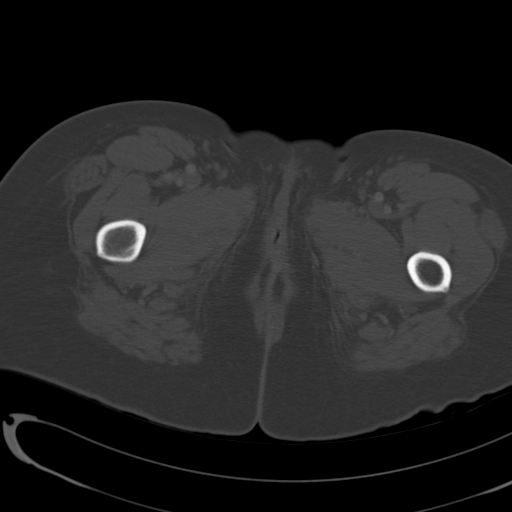
[im 13/93  soft-tissue]
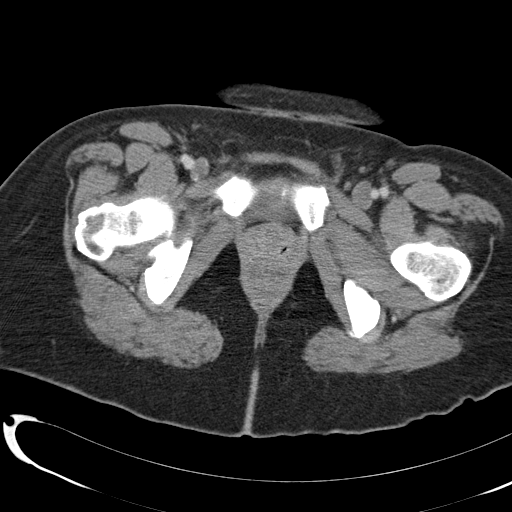
[im 21/93  soft-tissue]
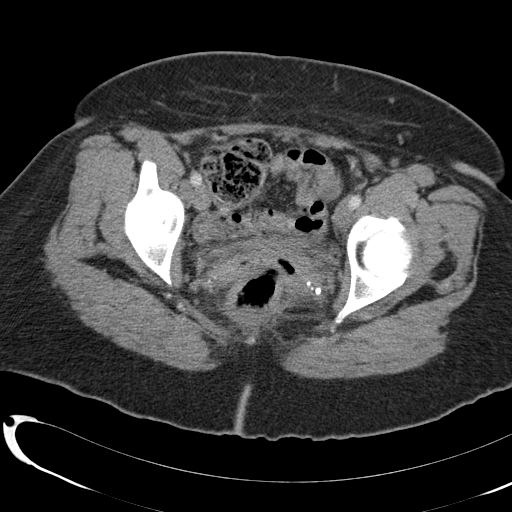
[im 26/93  soft-tissue]
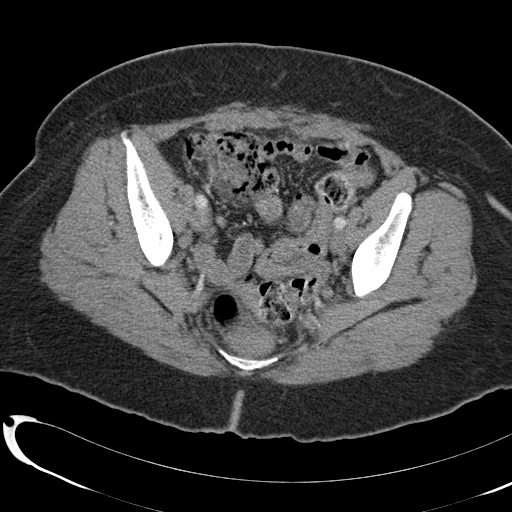
[im 34/93  soft-tissue]
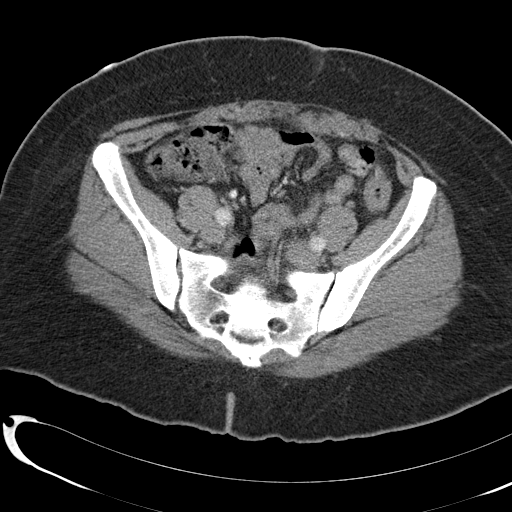
[im 38/93  soft-tissue]
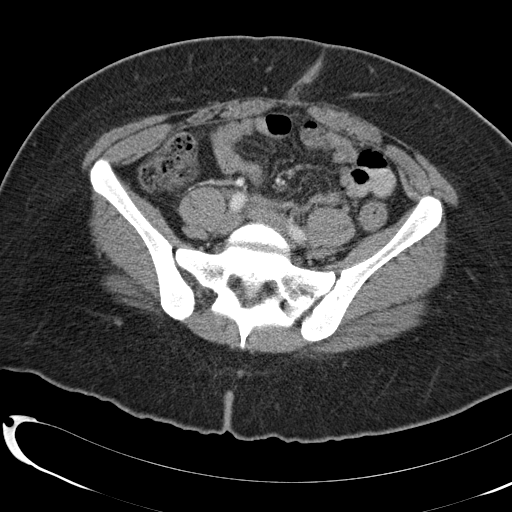
[im 47/93  soft-tissue]
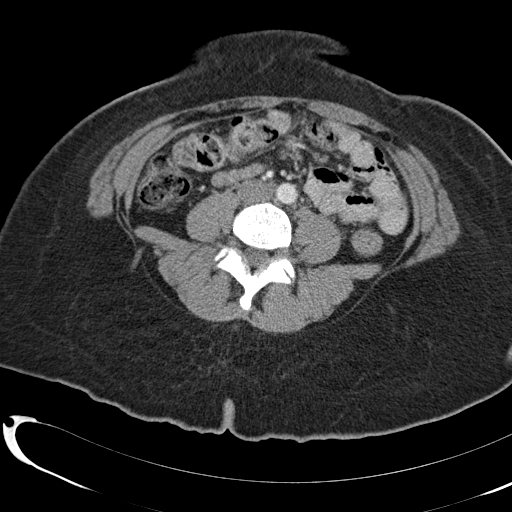
[im 55/93  soft-tissue]
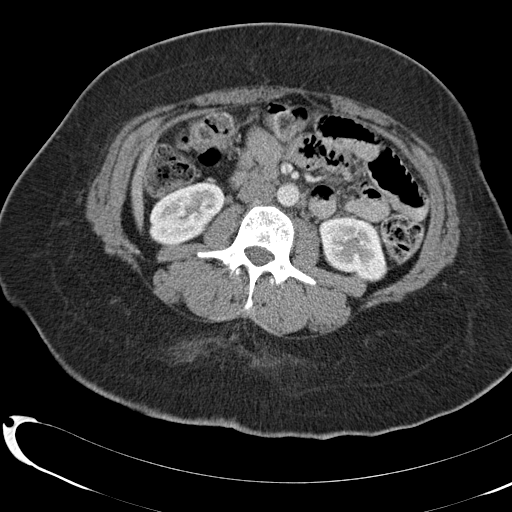
[im 59/93  soft-tissue]
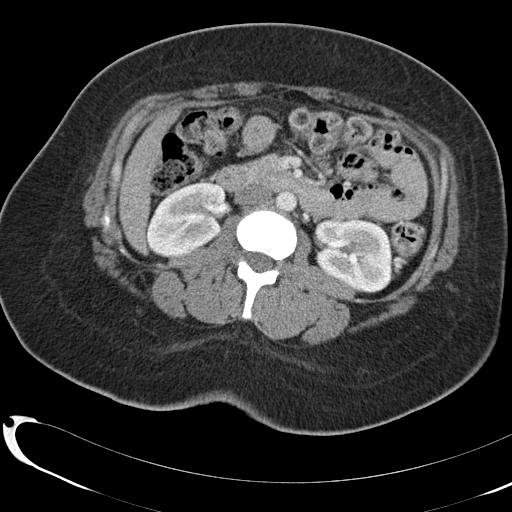
[im 59/93  bone]
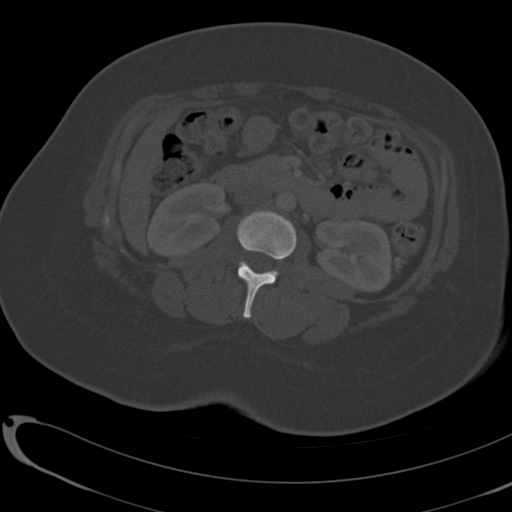
[im 67/93  soft-tissue]
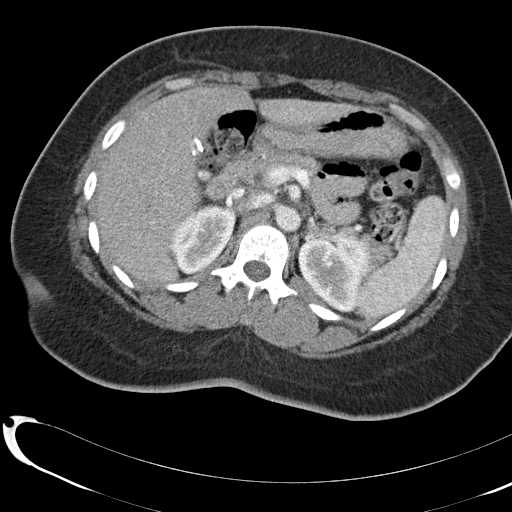
[im 72/93  soft-tissue]
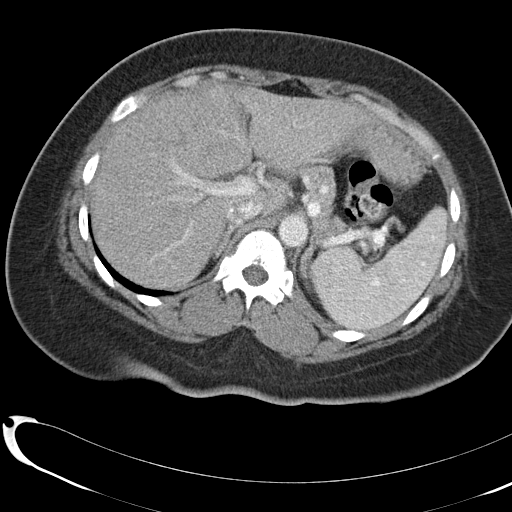
[im 80/93  soft-tissue]
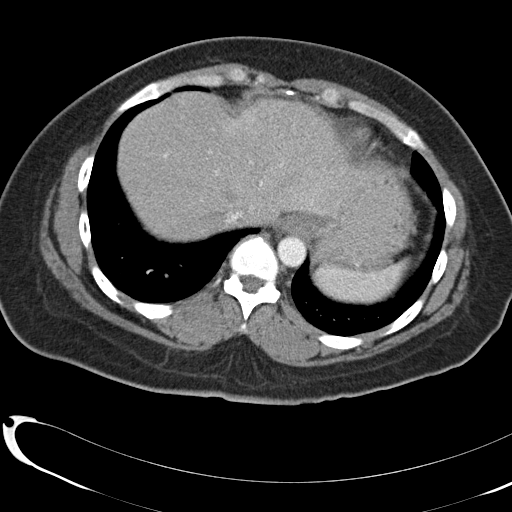
[im 88/93  soft-tissue]
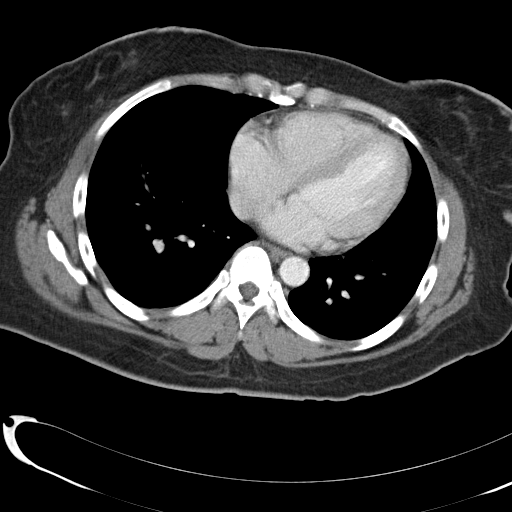

[Series 3: coronal · coronal · 0.66mm/px · 3 of 108 slices shown]
[im 36/108  soft-tissue]
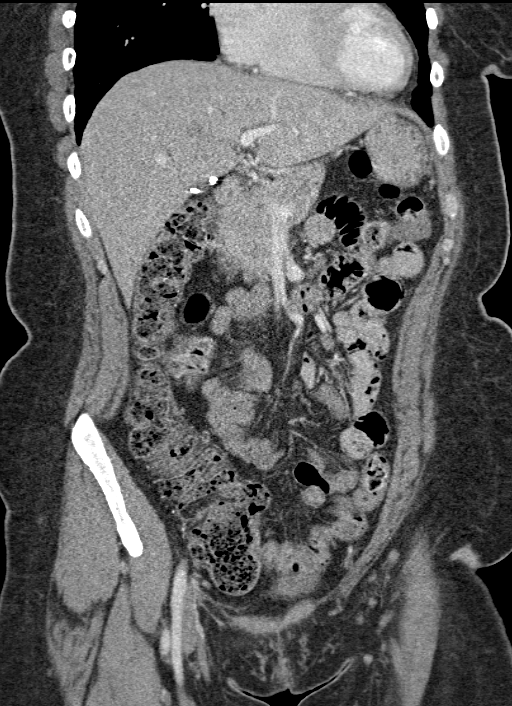
[im 48/108  soft-tissue]
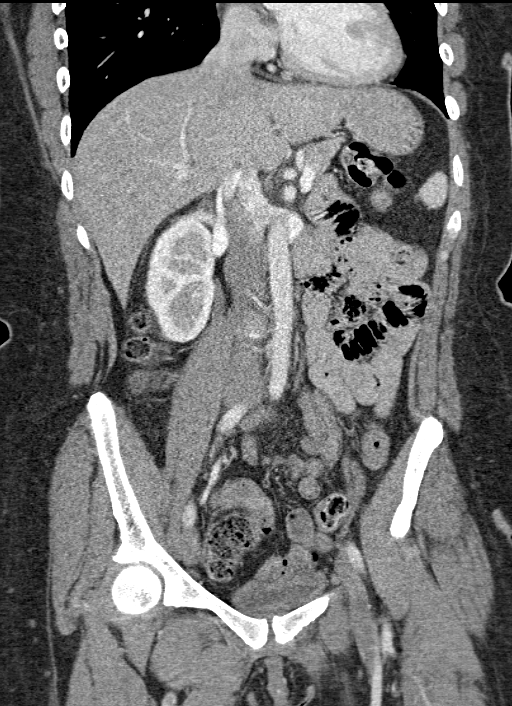
[im 60/108  soft-tissue]
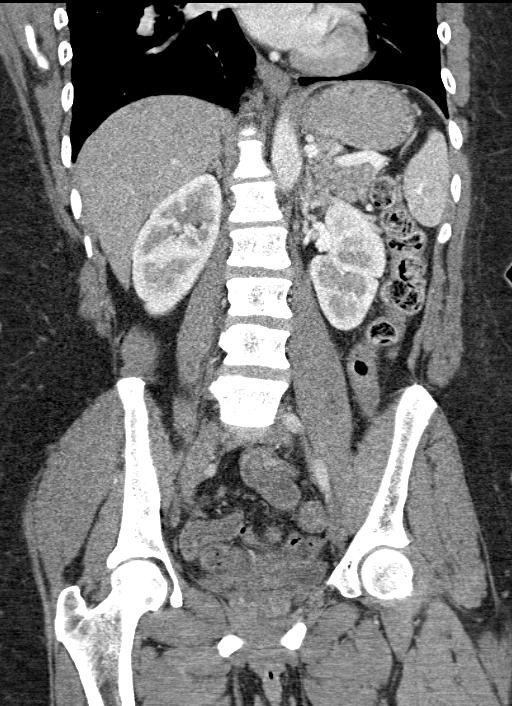

[16 of 46 positions shown; findings below may reference images not displayed]

FINDINGS: The visualized lung bases are clear. No intra-abdominal free air or
free fluid.

Cholecystectomy. The liver, pancreas, spleen, adrenal glands,
kidneys, visualized ureters appear unremarkable. The urinary bladder
is predominantly collapsed. Hysterectomy.

Constipation. There is no evidence of bowel obstruction or active
inflammation. Normal appendix.

The abdominal aorta and IVC appear unremarkable. No portal venous
gas identified. There is no adenopathy. The abdominal wall soft
tissues appear unremarkable. The osseous structures are intact.
IMPRESSION: No acute intra-abdominal pelvic pathology.

Constipation. No evidence of bowel obstruction or active
inflammation. Normal appendix.

## 2017-06-15 ENCOUNTER — Other Ambulatory Visit: Payer: Medicare Other | Admitting: Obstetrics & Gynecology

## 2017-06-17 ENCOUNTER — Ambulatory Visit: Payer: Medicare Other | Admitting: Orthopaedic Surgery

## 2017-06-17 ENCOUNTER — Encounter: Payer: Self-pay | Admitting: Orthopaedic Surgery

## 2017-07-01 ENCOUNTER — Other Ambulatory Visit: Payer: Medicare Other | Admitting: Obstetrics & Gynecology

## 2017-08-03 ENCOUNTER — Ambulatory Visit (HOSPITAL_COMMUNITY)
Admission: RE | Admit: 2017-08-03 | Discharge: 2017-08-03 | Disposition: A | Discharge status: Home / Self Care | Payer: Medicare Other | Source: Ambulatory Visit | Attending: Family Medicine | Admitting: Family Medicine

## 2017-08-03 ENCOUNTER — Other Ambulatory Visit (HOSPITAL_COMMUNITY): Payer: Self-pay | Admitting: Family Medicine

## 2017-08-03 DIAGNOSIS — R590 Localized enlarged lymph nodes: Secondary | ICD-10-CM | POA: Diagnosis present

## 2017-08-03 DIAGNOSIS — E042 Nontoxic multinodular goiter: Secondary | ICD-10-CM | POA: Insufficient documentation

## 2018-10-12 ENCOUNTER — Other Ambulatory Visit (HOSPITAL_COMMUNITY): Payer: Self-pay | Admitting: Family Medicine

## 2018-10-12 ENCOUNTER — Other Ambulatory Visit: Payer: Self-pay | Admitting: Family Medicine

## 2018-10-12 DIAGNOSIS — E041 Nontoxic single thyroid nodule: Secondary | ICD-10-CM

## 2018-10-18 ENCOUNTER — Encounter (HOSPITAL_COMMUNITY): Payer: Self-pay

## 2018-10-18 ENCOUNTER — Ambulatory Visit (HOSPITAL_COMMUNITY): Admission: RE | Admit: 2018-10-18 | Payer: Medicare Other | Source: Ambulatory Visit

## 2019-05-11 ENCOUNTER — Other Ambulatory Visit: Payer: Self-pay | Admitting: Family Medicine

## 2019-05-11 ENCOUNTER — Other Ambulatory Visit (HOSPITAL_COMMUNITY): Payer: Self-pay | Admitting: Family Medicine

## 2019-05-11 DIAGNOSIS — E042 Nontoxic multinodular goiter: Secondary | ICD-10-CM

## 2019-05-16 ENCOUNTER — Encounter (HOSPITAL_COMMUNITY): Payer: Self-pay

## 2019-05-16 ENCOUNTER — Ambulatory Visit (HOSPITAL_COMMUNITY): Admission: RE | Admit: 2019-05-16 | Payer: Medicare Other | Source: Ambulatory Visit

## 2019-11-22 ENCOUNTER — Encounter: Payer: Self-pay | Admitting: Radiology

## 2020-05-14 ENCOUNTER — Encounter: Payer: Self-pay | Admitting: Orthopaedic Surgery

## 2020-05-14 ENCOUNTER — Ambulatory Visit: Payer: Medicare Other

## 2020-05-14 ENCOUNTER — Ambulatory Visit (INDEPENDENT_AMBULATORY_CARE_PROVIDER_SITE_OTHER): Payer: Medicare Other | Admitting: Orthopaedic Surgery

## 2020-05-14 ENCOUNTER — Other Ambulatory Visit: Payer: Self-pay

## 2020-05-14 VITALS — Ht 63.0 in | Wt 286.1 lb

## 2020-05-14 DIAGNOSIS — M25561 Pain in right knee: Secondary | ICD-10-CM | POA: Diagnosis not present

## 2020-05-14 DIAGNOSIS — G8929 Other chronic pain: Secondary | ICD-10-CM | POA: Diagnosis not present

## 2020-05-14 NOTE — Progress Notes (Signed)
Subjective:    Patient ID: Rachel Ray, female    DOB: 01/04/1981, 39 y.o.   MRN: [id number]  HPI She has had right knee pain getting worse over the last several months.  She has pain medially.  She has swelling and popping and giving way. She has pain at night.  She uses a brace, has taken Advil, used ice/heat with little help.  She has no other joint pain.  She has no redness.  She has no numbness.   Review of Systems  Constitutional: Positive for activity change.  Musculoskeletal: Positive for arthralgias, gait problem and joint swelling.  All other systems reviewed and are negative.  For Review of Systems, all other systems reviewed and are negative.  The following is a summary of the past history medically, past history surgically, known current medicines, social history and family history.  This information is gathered electronically by the computer from prior information and documentation.  I review this each visit and have found including this information at this point in the chart is beneficial and informative.   Past Medical History:  Diagnosis Date  . Anemia   . Anxiety   . Arthritis    rheumatoid  . Chronic pelvic pain in female   . Depression   . Dysmenorrhea   . Hypertension   . Menorrhagia   . Uterine fibroid     Past Surgical History:  Procedure Laterality Date  . ABDOMINAL HYSTERECTOMY N/A 03/25/2016   Procedure: HYSTERECTOMY ABDOMINAL;  Surgeon: Florian Buff, MD;  Location: [facility];  Service: Gynecology;  Laterality: N/A;  . ADENOIDECTOMY    . CESAREAN SECTION     x 3  . CHOLECYSTECTOMY    . SALPINGOOPHORECTOMY Bilateral 03/25/2016   Procedure: SALPINGO OOPHORECTOMY;  Surgeon: Florian Buff, MD;  Location: [facility];  Service: Gynecology;  Laterality: Bilateral;  . TONSILLECTOMY    . TUBAL LIGATION      Current Outpatient Medications on File Prior to Visit  Medication Sig Dispense Refill  . ALPRAZolam (XANAX) 1 MG tablet Take 1 mg by mouth 3 (three) times  daily as needed for anxiety.    . diazepam (VALIUM) 10 MG tablet Take 10 mg by mouth at bedtime.    Marland Kitchen escitalopram (LEXAPRO) 10 MG tablet Take 10 mg by mouth daily.    Marland Kitchen estradiol (ESTRACE) 2 MG tablet TAKE 1 TABLET BY MOUTH TWICE DAILY 60 tablet 11  . hydrochlorothiazide (HYDRODIURIL) 25 MG tablet Take 25 mg by mouth daily as needed (for high blood pressure levels).     . naproxen (NAPROSYN) 500 MG tablet Take 1 tablet (500 mg total) by mouth 2 (two) times daily with a meal. 20 tablet 0  . Potassium Chloride Crys ER (KLOR-CON M10 PO) Take by mouth.    . traMADol (ULTRAM) 50 MG tablet Take 50 mg by mouth every 6 (six) hours as needed.    . traZODone (DESYREL) 100 MG tablet Take 100 mg by mouth at bedtime.      No current facility-administered medications on file prior to visit.    Social History   Socioeconomic History  . Marital status: Legally Separated    Spouse name: Not on file  . Number of children: Not on file  . Years of education: Not on file  . Highest education level: Not on file  Occupational History  . Not on file  Tobacco Use  . Smoking status: Former Smoker    Packs/day: 0.50  Years: 15.00    Pack years: 7.50    Types: Cigarettes    Quit date: 01/22/2020    Years since quitting: 0.3  . Smokeless tobacco: Never Used  Substance and Sexual Activity  . Alcohol use: No    Alcohol/week: 0.0 standard drinks  . Drug use: No  . Sexual activity: Yes    Birth control/protection: Surgical  Other Topics Concern  . Not on file  Social History Narrative  . Not on file   Social Determinants of Health   Financial Resource Strain:   . Difficulty of Paying Living Expenses:   Food Insecurity:   . Worried About Charity fundraiser in the Last Year:   . Arboriculturist in the Last Year:   Transportation Needs:   . Film/video editor (Medical):   Marland Kitchen Lack of Transportation (Non-Medical):   Physical Activity:   . Days of Exercise per Week:   . Minutes of Exercise per  Session:   Stress:   . Feeling of Stress :   Social Connections:   . Frequency of Communication with Friends and Family:   . Frequency of Social Gatherings with Friends and Family:   . Attends Religious Services:   . Active Member of Clubs or Organizations:   . Attends Archivist Meetings:   Marland Kitchen Marital Status:   Intimate Partner Violence:   . Fear of Current or Ex-Partner:   . Emotionally Abused:   Marland Kitchen Physically Abused:   . Sexually Abused:     Family History  Problem Relation Age of Onset  . Diabetes Maternal Grandmother   . Diabetes Maternal Grandfather   . Cancer Other        great grandmother-breast cancer  . Cancer Maternal Aunt        breast cancer    Ht 5\' 3"  (1.6 m)   Wt 286 lb 2 oz (129.8 kg)   LMP 03/13/2016 (Exact Date)   BMI 50.68 kg/m   Body mass index is 50.68 kg/m.     Objective:   Physical Exam Vitals and nursing note reviewed.  Constitutional:      Appearance: She is well-developed.  HENT:     Head: Normocephalic and atraumatic.  Eyes:     Conjunctiva/sclera: Conjunctivae normal.     Pupils: Pupils are equal, round, and reactive to light.  Cardiovascular:     Rate and Rhythm: Normal rate and regular rhythm.  Pulmonary:     Effort: Pulmonary effort is normal.  Abdominal:     Palpations: Abdomen is soft.  Musculoskeletal:     Cervical back: Normal range of motion and neck supple.       Legs:  Skin:    General: Skin is warm and dry.  Neurological:     Mental Status: She is alert and oriented to person, place, and time.     Cranial Nerves: No cranial nerve deficit.     Motor: No abnormal muscle tone.     Coordination: Coordination normal.     Deep Tendon Reflexes: Reflexes are normal and symmetric. Reflexes normal.  Psychiatric:        Behavior: Behavior normal.        Thought Content: Thought content normal.        Judgment: Judgment normal.    X-rays were done of the right knee, reported separately.       Assessment &  Plan:   Encounter Diagnosis  Name Primary?  . Chronic pain of right  knee Yes   PROCEDURE NOTE:  The patient requests injections of the right knee , verbal consent was obtained.  The right knee was prepped appropriately after time out was performed.   Sterile technique was observed and injection of 1 cc of Depo-Medrol 40 mg with several cc's of plain xylocaine. Anesthesia was provided by ethyl chloride and a 20-gauge needle was used to inject the knee area. The injection was tolerated well.  A band aid dressing was applied.  The patient was advised to apply ice later today and tomorrow to the injection sight as needed.  I am concerned about possible medial meniscus tear.  Consider MRI if not improved.  Call if any problem.  Precautions discussed.  Return in two weeks.  Electronically Signed Sanjuana Kava, MD 6/22/20212:07 PM

## 2020-05-28 ENCOUNTER — Ambulatory Visit: Payer: Medicare Other | Admitting: Orthopaedic Surgery

## 2020-05-29 ENCOUNTER — Encounter: Payer: Self-pay | Admitting: Orthopaedic Surgery

## 2020-06-26 ENCOUNTER — Other Ambulatory Visit (HOSPITAL_COMMUNITY): Payer: Self-pay | Admitting: Nurse Practitioner

## 2020-06-26 DIAGNOSIS — Z1231 Encounter for screening mammogram for malignant neoplasm of breast: Secondary | ICD-10-CM

## 2020-07-09 ENCOUNTER — Ambulatory Visit (HOSPITAL_COMMUNITY): Payer: Medicare Other

## 2020-07-11 ENCOUNTER — Ambulatory Visit: Payer: Medicare Other | Admitting: Orthopaedic Surgery

## 2020-09-04 ENCOUNTER — Ambulatory Visit: Payer: Medicare Other | Admitting: Orthopedic Surgery

## 2020-09-04 ENCOUNTER — Encounter: Payer: Self-pay | Admitting: Orthopedic Surgery

## 2020-09-17 ENCOUNTER — Ambulatory Visit: Payer: Medicare Other | Admitting: Orthopaedic Surgery

## 2020-10-29 ENCOUNTER — Other Ambulatory Visit: Payer: Self-pay

## 2020-10-29 ENCOUNTER — Ambulatory Visit (HOSPITAL_COMMUNITY)
Admission: RE | Admit: 2020-10-29 | Discharge: 2020-10-29 | Disposition: A | Discharge status: Home / Self Care | Payer: Medicare Other | Source: Ambulatory Visit | Attending: Nurse Practitioner | Admitting: Nurse Practitioner

## 2020-10-29 ENCOUNTER — Other Ambulatory Visit (HOSPITAL_COMMUNITY): Payer: Self-pay | Admitting: Nurse Practitioner

## 2020-10-29 ENCOUNTER — Ambulatory Visit: Payer: Medicare Other | Admitting: Orthopaedic Surgery

## 2020-10-29 ENCOUNTER — Encounter: Payer: Self-pay | Admitting: Orthopaedic Surgery

## 2020-10-29 DIAGNOSIS — M25561 Pain in right knee: Secondary | ICD-10-CM | POA: Insufficient documentation

## 2020-10-29 DIAGNOSIS — W19XXXA Unspecified fall, initial encounter: Secondary | ICD-10-CM

## 2020-10-31 ENCOUNTER — Other Ambulatory Visit: Payer: Self-pay

## 2020-10-31 ENCOUNTER — Emergency Department (HOSPITAL_COMMUNITY)
Admission: EM | Admit: 2020-10-31 | Discharge: 2020-10-31 | Discharge status: Against Medical Advice | Payer: Medicare Other

## 2021-05-14 ENCOUNTER — Other Ambulatory Visit (HOSPITAL_COMMUNITY): Payer: Self-pay | Admitting: Family Medicine

## 2021-05-14 ENCOUNTER — Other Ambulatory Visit: Payer: Self-pay | Admitting: Family Medicine

## 2021-05-14 DIAGNOSIS — E049 Nontoxic goiter, unspecified: Secondary | ICD-10-CM

## 2021-05-14 DIAGNOSIS — E041 Nontoxic single thyroid nodule: Secondary | ICD-10-CM

## 2021-05-27 ENCOUNTER — Ambulatory Visit (HOSPITAL_COMMUNITY): Payer: Medicare Other

## 2021-06-16 ENCOUNTER — Other Ambulatory Visit (HOSPITAL_COMMUNITY): Payer: Self-pay | Admitting: Family Medicine

## 2021-06-16 DIAGNOSIS — M25561 Pain in right knee: Secondary | ICD-10-CM

## 2021-06-26 ENCOUNTER — Ambulatory Visit (HOSPITAL_COMMUNITY): Payer: Medicare Other | Attending: Family Medicine

## 2021-06-26 ENCOUNTER — Encounter (HOSPITAL_COMMUNITY): Payer: Self-pay

## 2021-09-29 ENCOUNTER — Other Ambulatory Visit: Payer: Self-pay

## 2021-09-29 ENCOUNTER — Encounter (HOSPITAL_COMMUNITY): Payer: Self-pay

## 2021-09-29 ENCOUNTER — Emergency Department (HOSPITAL_COMMUNITY)
Admission: EM | Admit: 2021-09-29 | Discharge: 2021-09-29 | Disposition: A | Discharge status: Home / Self Care | Payer: Medicare Other | Attending: Emergency Medicine | Admitting: Emergency Medicine

## 2021-09-29 DIAGNOSIS — Z87891 Personal history of nicotine dependence: Secondary | ICD-10-CM | POA: Insufficient documentation

## 2021-09-29 DIAGNOSIS — M25562 Pain in left knee: Secondary | ICD-10-CM | POA: Insufficient documentation

## 2021-09-29 DIAGNOSIS — I10 Essential (primary) hypertension: Secondary | ICD-10-CM | POA: Insufficient documentation

## 2021-09-29 DIAGNOSIS — M25561 Pain in right knee: Secondary | ICD-10-CM | POA: Insufficient documentation

## 2021-09-29 DIAGNOSIS — Z79899 Other long term (current) drug therapy: Secondary | ICD-10-CM | POA: Insufficient documentation

## 2021-09-29 DIAGNOSIS — G8929 Other chronic pain: Secondary | ICD-10-CM | POA: Diagnosis not present

## 2021-09-29 MED ORDER — DICLOFENAC SODIUM 1 % EX GEL: 4.0000 g | Freq: Four times a day (QID) | CUTANEOUS | 0 refills | Status: DC

## 2021-09-29 MED ORDER — NAPROXEN 500 MG PO TABS: 500.0000 mg | ORAL_TABLET | Freq: Two times a day (BID) | ORAL | 0 refills | Status: DC

## 2021-09-29 MED ORDER — NAPROXEN 250 MG PO TABS
500.0000 mg | ORAL_TABLET | Freq: Once | ORAL | Status: AC
  Administered 2021-09-29: 500 mg via ORAL
  Filled 2021-09-29: qty 2

## 2021-09-29 NOTE — Discharge Instructions (Signed)
Take the medication as directed and please take with food.  You may also apply the diclofenac gel as directed.  Wear the knee brace as needed for support.  Please remove at rest, bedtime and for bathing.  Call Dr. Ruthe Mannan office to arrange a follow-up appointment.

## 2021-09-29 NOTE — ED Notes (Signed)
Pt c/o bilateral knee pain since she woke up this morning. Describes the pain as "burning inside". Reports she has a torn meniscus in each knee.

## 2021-09-29 NOTE — ED Triage Notes (Signed)
Pt presents to ED with complaints of bilateral knee pain since yesterday. NKI

## 2021-10-03 NOTE — ED Provider Notes (Signed)
[facility] Provider Note   CSN: [id number] Arrival date & time: 09/29/21  3016     History Chief Complaint  Patient presents with   Knee Pain    Rachel Ray is a 40 y.o. female.   Knee Pain Associated symptoms: no back pain and no fever      Rachel Ray is a 40 y.o. female who presents to the Emergency Department complaining of bilateral knee pain for one day.  She describes pain as aching and pain to right knee much worse than left.  Hx of chronic right knee pain.  No known injury.  Pain worse with weight bearing, improves at rest.  Believes that she is shifting weight from right to her left knee.  She denies swelling, redness, weakness of her legs, fever or chills.    Past Medical History:  Diagnosis Date   Anemia    Anxiety    Arthritis    rheumatoid   Chronic pelvic pain in female    Depression    Dysmenorrhea    Hypertension    Menorrhagia    Uterine fibroid     Patient Active Problem List   Diagnosis Date Noted   Multinodular goiter 10/20/2016   S/P hysterectomy 03/25/2016    Past Surgical History:  Procedure Laterality Date   ABDOMINAL HYSTERECTOMY N/A 03/25/2016   Procedure: HYSTERECTOMY ABDOMINAL;  Surgeon: Florian Buff, MD;  Location: [facility];  Service: Gynecology;  Laterality: N/A;   ADENOIDECTOMY     CESAREAN SECTION     x 3   CHOLECYSTECTOMY     SALPINGOOPHORECTOMY Bilateral 03/25/2016   Procedure: SALPINGO OOPHORECTOMY;  Surgeon: Florian Buff, MD;  Location: [facility];  Service: Gynecology;  Laterality: Bilateral;   TONSILLECTOMY     TUBAL LIGATION       OB History     Gravida  4   Para  3   Term  3   Preterm      AB  1   Living  3      SAB  1   IAB      Ectopic      Multiple      Live Births              Family History  Problem Relation Age of Onset   Diabetes Maternal Grandmother    Diabetes Maternal Grandfather    Cancer Other        great grandmother-breast cancer   Cancer Maternal  Aunt        breast cancer    Social History   Tobacco Use   Smoking status: Former    Packs/day: 0.50    Years: 15.00    Pack years: 7.50    Types: Cigarettes    Quit date: 01/22/2020    Years since quitting: 1.6   Smokeless tobacco: Never  Substance Use Topics   Alcohol use: No    Alcohol/week: 0.0 standard drinks   Drug use: No    Home Medications Prior to Admission medications   Medication Sig Start Date End Date Taking? Authorizing Provider  diclofenac Sodium (VOLTAREN) 1 % GEL Apply 4 g topically 4 (four) times daily. 09/29/21  Yes Shukri Nistler, PA-C  naproxen (NAPROSYN) 500 MG tablet Take 1 tablet (500 mg total) by mouth 2 (two) times daily with a meal. 09/29/21  Yes Greg Cratty, PA-C  ALPRAZolam (XANAX) 1 MG tablet Take 1 mg by mouth 3 (three) times daily as  needed for anxiety.    [provider]  diazepam (VALIUM) 10 MG tablet Take 10 mg by mouth at bedtime.    [provider]  escitalopram (LEXAPRO) 10 MG tablet Take 10 mg by mouth daily. 06/18/16   [provider]  estradiol (ESTRACE) 2 MG tablet TAKE 1 TABLET BY MOUTH TWICE DAILY 12/26/16   Florian Buff, MD  hydrochlorothiazide (HYDRODIURIL) 25 MG tablet Take 25 mg by mouth daily as needed (for high blood pressure levels).     [provider]  Potassium Chloride Crys ER (KLOR-CON M10 PO) Take by mouth.    [provider]  traMADol (ULTRAM) 50 MG tablet Take 50 mg by mouth every 6 (six) hours as needed.    [provider]  traZODone (DESYREL) 100 MG tablet Take 100 mg by mouth at bedtime.  03/12/16   [provider]    Allergies    Shellfish allergy and Penicillins  Review of Systems   Review of Systems  Constitutional:  Negative for chills and fever.  Respiratory:  Negative for shortness of breath.   Cardiovascular:  Negative for chest pain.  Gastrointestinal:  Negative for nausea and vomiting.  Musculoskeletal:  Positive for arthralgias (knee  pain). Negative for back pain and myalgias.  Skin:  Negative for color change, rash and wound.  Neurological:  Negative for weakness and numbness.  Hematological:  Does not bruise/bleed easily.  All other systems reviewed and are negative.  Physical Exam Updated Vital Signs BP 126/88   Pulse 67   Temp 97.8 F (36.6 C) (Oral)   Resp 16   Ht 5\' 2"  (1.575 m)   Wt 115.2 kg   LMP 03/13/2016 (Exact Date)   SpO2 98%   BMI 46.46 kg/m   Physical Exam Vitals and nursing note reviewed.  Constitutional:      General: She is not in acute distress.    Appearance: Normal appearance. She is not ill-appearing or toxic-appearing.  Cardiovascular:     Rate and Rhythm: Normal rate and regular rhythm.     Pulses: Normal pulses.  Pulmonary:     Effort: Pulmonary effort is normal. No respiratory distress.  Musculoskeletal:        General: Tenderness present. No swelling or signs of injury.     Right lower leg: No edema.     Left lower leg: No edema.     Comments: Ttp of the anterior right knee. Minimal tenderness with valgus and varus stress.  No laxity.  No edema, erythema or excessive warmth of the joint.  Patella tendon non tender.  Skin:    General: Skin is warm.     Capillary Refill: Capillary refill takes less than 2 seconds.     Findings: No erythema or rash.  Neurological:     General: No focal deficit present.     Mental Status: She is alert.     Motor: No weakness.    ED Results / Procedures / Treatments   Labs (all labs ordered are listed, but only abnormal results are displayed) Labs Reviewed - No data to display  EKG None  Radiology No results found.  Procedures Procedures   Medications Ordered in ED Medications  naproxen (NAPROSYN) tablet 500 mg (500 mg Oral Given 09/29/21 1019)    ED Course  I have reviewed the triage vital signs and the nursing notes.  Pertinent labs & imaging results that were available during my care of the patient were reviewed by me and  considered in my medical decision making (see chart for details).    MDM Rules/Calculators/A&P                           Pt with likely acute on chronic right knee pain.  No recent injury.  Pain with ROM of the right knee.  No concerning symptoms to suggest septic joint.  NV intact.  No trauma or reported injury to suggest need for imaging.    Knee brace applied.  Rx for NSAID.  She appears stable for d/c.     Final Clinical Impression(s) / ED Diagnoses Final diagnoses:  Chronic pain of right knee    Rx / DC Orders ED Discharge Orders          Ordered    naproxen (NAPROSYN) 500 MG tablet  2 times daily with meals        09/29/21 1037    diclofenac Sodium (VOLTAREN) 1 % GEL  4 times daily        09/29/21 Templeton, Baeleigh Devincent, PA-C 10/03/21 2031    Milton Ferguson, MD 10/10/21 [phone]

## 2022-03-03 ENCOUNTER — Other Ambulatory Visit (HOSPITAL_COMMUNITY): Payer: Self-pay | Admitting: Family Medicine

## 2022-03-03 ENCOUNTER — Ambulatory Visit (HOSPITAL_COMMUNITY)
Admission: RE | Admit: 2022-03-03 | Discharge: 2022-03-03 | Disposition: A | Discharge status: Home / Self Care | Payer: Medicare Other | Source: Ambulatory Visit | Attending: Family Medicine | Admitting: Family Medicine

## 2022-03-03 DIAGNOSIS — M25561 Pain in right knee: Secondary | ICD-10-CM | POA: Diagnosis present

## 2022-03-03 DIAGNOSIS — M545 Low back pain, unspecified: Secondary | ICD-10-CM | POA: Diagnosis present

## 2022-03-27 ENCOUNTER — Other Ambulatory Visit: Payer: Self-pay | Admitting: Family Medicine

## 2022-03-27 ENCOUNTER — Other Ambulatory Visit (HOSPITAL_COMMUNITY): Payer: Self-pay | Admitting: Family Medicine

## 2022-03-27 DIAGNOSIS — M5440 Lumbago with sciatica, unspecified side: Secondary | ICD-10-CM

## 2022-04-15 ENCOUNTER — Ambulatory Visit (HOSPITAL_COMMUNITY): Payer: Medicare Other

## 2022-04-29 ENCOUNTER — Encounter (HOSPITAL_COMMUNITY): Payer: Self-pay

## 2022-04-29 ENCOUNTER — Ambulatory Visit (HOSPITAL_COMMUNITY): Payer: Medicare Other | Attending: Family Medicine

## 2022-06-02 ENCOUNTER — Ambulatory Visit (HOSPITAL_COMMUNITY)
Admission: RE | Admit: 2022-06-02 | Discharge: 2022-06-02 | Disposition: A | Discharge status: Home / Self Care | Payer: Medicare Other | Source: Ambulatory Visit | Attending: Family Medicine | Admitting: Family Medicine

## 2022-06-02 DIAGNOSIS — M5440 Lumbago with sciatica, unspecified side: Secondary | ICD-10-CM | POA: Diagnosis not present

## 2022-07-07 ENCOUNTER — Ambulatory Visit: Payer: Medicare Other | Admitting: Orthopaedic Surgery

## 2023-01-21 ENCOUNTER — Encounter: Payer: Self-pay | Admitting: Radiology

## 2023-04-12 ENCOUNTER — Other Ambulatory Visit (HOSPITAL_COMMUNITY): Payer: Self-pay | Admitting: Nurse Practitioner

## 2023-04-12 DIAGNOSIS — Z1231 Encounter for screening mammogram for malignant neoplasm of breast: Secondary | ICD-10-CM

## 2023-04-22 ENCOUNTER — Encounter (HOSPITAL_COMMUNITY): Payer: Self-pay

## 2023-04-22 ENCOUNTER — Other Ambulatory Visit: Payer: Self-pay

## 2023-04-22 ENCOUNTER — Emergency Department (HOSPITAL_COMMUNITY)
Admission: EM | Admit: 2023-04-22 | Discharge: 2023-04-23 | Disposition: A | Discharge status: Home / Self Care | Payer: 59 | Attending: Emergency Medicine | Admitting: Emergency Medicine

## 2023-04-22 DIAGNOSIS — R440 Auditory hallucinations: Secondary | ICD-10-CM | POA: Insufficient documentation

## 2023-04-22 DIAGNOSIS — R079 Chest pain, unspecified: Secondary | ICD-10-CM | POA: Diagnosis not present

## 2023-04-22 DIAGNOSIS — F329 Major depressive disorder, single episode, unspecified: Secondary | ICD-10-CM | POA: Diagnosis not present

## 2023-04-22 DIAGNOSIS — F411 Generalized anxiety disorder: Secondary | ICD-10-CM | POA: Diagnosis present

## 2023-04-22 DIAGNOSIS — R443 Hallucinations, unspecified: Secondary | ICD-10-CM

## 2023-04-22 LAB — CBC WITH DIFFERENTIAL/PLATELET
Abs Immature Granulocytes: 0.02 10*3/uL (ref 0.00–0.07)
Basophils Absolute: 0 10*3/uL (ref 0.0–0.1)
Basophils Relative: 0 %
Eosinophils Absolute: 0 10*3/uL (ref 0.0–0.5)
Eosinophils Relative: 1 %
HCT: 33.7 % — ABNORMAL LOW (ref 36.0–46.0)
Hemoglobin: 11.4 g/dL — ABNORMAL LOW (ref 12.0–15.0)
Immature Granulocytes: 0 %
Lymphocytes Relative: 28 %
Lymphs Abs: 1.6 10*3/uL (ref 0.7–4.0)
MCH: 31.7 pg (ref 26.0–34.0)
MCHC: 33.8 g/dL (ref 30.0–36.0)
MCV: 93.6 fL (ref 80.0–100.0)
Monocytes Absolute: 0.4 10*3/uL (ref 0.1–1.0)
Monocytes Relative: 6 %
Neutro Abs: 3.6 10*3/uL (ref 1.7–7.7)
Neutrophils Relative %: 65 %
Platelets: 186 10*3/uL (ref 150–400)
RBC: 3.6 MIL/uL — ABNORMAL LOW (ref 3.87–5.11)
RDW: 12.5 % (ref 11.5–15.5)
WBC: 5.6 10*3/uL (ref 4.0–10.5)
nRBC: 0 % (ref 0.0–0.2)

## 2023-04-22 LAB — COMPREHENSIVE METABOLIC PANEL
ALT: 23 U/L (ref 0–44)
AST: 25 U/L (ref 15–41)
Albumin: 3.7 g/dL (ref 3.5–5.0)
Alkaline Phosphatase: 52 U/L (ref 38–126)
Anion gap: 10 (ref 5–15)
BUN: 11 mg/dL (ref 6–20)
CO2: 27 mmol/L (ref 22–32)
Calcium: 8.8 mg/dL — ABNORMAL LOW (ref 8.9–10.3)
Chloride: 98 mmol/L (ref 98–111)
Creatinine, Ser: 0.74 mg/dL (ref 0.44–1.00)
GFR, Estimated: >60 mL/min (ref >60)
Glucose, Bld: 121 mg/dL — ABNORMAL HIGH (ref 70–99)
Potassium: 2.9 mmol/L — ABNORMAL LOW (ref 3.5–5.1)
Sodium: 135 mmol/L (ref 135–145)
Total Bilirubin: 0.4 mg/dL (ref 0.3–1.2)
Total Protein: 7.6 g/dL (ref 6.5–8.1)

## 2023-04-22 LAB — RAPID URINE DRUG SCREEN, HOSP PERFORMED
Amphetamines: NOT DETECTED
Barbiturates: NOT DETECTED
Benzodiazepines: NOT DETECTED
Cocaine: NOT DETECTED
Opiates: NOT DETECTED
Tetrahydrocannabinol: NOT DETECTED

## 2023-04-22 LAB — ETHANOL: Alcohol, Ethyl (B): <10 mg/dL (ref <10)

## 2023-04-22 MED ORDER — QUETIAPINE FUMARATE 100 MG PO TABS
200.0000 mg | ORAL_TABLET | Freq: Two times a day (BID) | ORAL | Status: DC
  Administered 2023-04-22 – 2023-04-23 (×3): 200 mg via ORAL
  Filled 2023-04-22 (×3): qty 2

## 2023-04-22 MED ORDER — LORAZEPAM 1 MG PO TABS
1.0000 mg | ORAL_TABLET | Freq: Once | ORAL | Status: AC
  Administered 2023-04-22: 1 mg via ORAL
  Filled 2023-04-22: qty 1

## 2023-04-22 NOTE — ED Provider Notes (Signed)
[place] EMERGENCY DEPARTMENT AT [facility] Provider Note   CSN: [id number] Arrival date & time: 04/22/23  2130     History  Chief Complaint  Patient presents with   Medical Clearance    Rachel Ray is a 42 y.o. female.  Patient states that she has been hearing voices.  She has a history of schizophrenia.   Altered Mental Status Presenting symptoms: behavior changes   Severity:  Moderate Most recent episode:  Today Episode history:  Continuous Timing:  Constant Progression:  Worsening Chronicity:  Recurrent Context: not alcohol use   Associated symptoms: hallucinations   Associated symptoms: no abdominal pain, no headaches, no rash and no seizures        Home Medications Prior to Admission medications   Medication Sig Start Date End Date Taking? Authorizing Provider  ALPRAZolam Prudy Feeler) 1 MG tablet Take 1 mg by mouth 3 (three) times daily as needed for anxiety.    [provider]  buprenorphine-naloxone (SUBOXONE) 8-2 mg SUBL SL tablet SMARTSIG:0.25 Tablet(s) Sublingual 4 Times Daily 01/19/22   [provider]  diazepam (VALIUM) 10 MG tablet Take 10 mg by mouth at bedtime.    [provider]  diclofenac Sodium (VOLTAREN) 1 % GEL Apply 4 g topically 4 (four) times daily. 09/29/21   Triplett, Tammy, PA-C  escitalopram (LEXAPRO) 10 MG tablet Take 10 mg by mouth daily. 06/18/16   [provider]  estradiol (ESTRACE) 2 MG tablet TAKE 1 TABLET BY MOUTH TWICE DAILY 12/26/16   Lazaro Arms, MD  hydrochlorothiazide (HYDRODIURIL) 25 MG tablet Take 25 mg by mouth daily as needed (for high blood pressure levels).     [provider]  naproxen (NAPROSYN) 500 MG tablet Take 1 tablet (500 mg total) by mouth 2 (two) times daily with a meal. 09/29/21   Triplett, Tammy, PA-C  Potassium Chloride Crys ER (KLOR-CON M10 PO) Take by mouth.    [provider]  QUEtiapine (SEROQUEL) 100 MG tablet Take 200 mg by mouth 2 (two) times  daily. 07/01/22   [provider]  traMADol (ULTRAM) 50 MG tablet Take 50 mg by mouth every 6 (six) hours as needed.    [provider]  traZODone (DESYREL) 100 MG tablet Take 100 mg by mouth at bedtime.  03/12/16   [provider]      Allergies    Shellfish allergy and Penicillins    Review of Systems   Review of Systems  Constitutional:  Negative for appetite change and fatigue.  HENT:  Negative for congestion, ear discharge and sinus pressure.   Eyes:  Negative for discharge.  Respiratory:  Negative for cough.   Cardiovascular:  Negative for chest pain.  Gastrointestinal:  Negative for abdominal pain and diarrhea.  Genitourinary:  Negative for frequency and hematuria.  Musculoskeletal:  Negative for back pain.  Skin:  Negative for rash.  Neurological:  Negative for seizures and headaches.  Psychiatric/Behavioral:  Positive for hallucinations.     Physical Exam Updated Vital Signs BP (!) 121/92   Pulse 72   Temp 98 F (36.7 C) (Oral)   Resp 18   Ht 5\' 2"  (1.575 m)   Wt 118.8 kg   LMP 03/13/2016 (Exact Date)   SpO2 100%   BMI 47.92 kg/m  Physical Exam Vitals and nursing note reviewed.  Constitutional:      Appearance: She is well-developed.  HENT:     Head: Normocephalic.     Nose: Nose normal.  Eyes:     General: No scleral icterus.    Conjunctiva/sclera: Conjunctivae normal.  Neck:     Thyroid: No thyromegaly.  Cardiovascular:     Rate and Rhythm: Normal rate and regular rhythm.     Heart sounds: No murmur heard.    No friction rub. No gallop.  Pulmonary:     Breath sounds: No stridor. No wheezing or rales.  Chest:     Chest wall: No tenderness.  Abdominal:     General: There is no distension.     Tenderness: There is no abdominal tenderness. There is no rebound.  Musculoskeletal:        General: Normal range of motion.     Cervical back: Neck supple.  Lymphadenopathy:     Cervical: No cervical adenopathy.  Skin:     Findings: No erythema or rash.  Neurological:     Mental Status: She is alert and oriented to person, place, and time.     Motor: No abnormal muscle tone.     Coordination: Coordination normal.  Psychiatric:     Comments: Patient with auditory hallucinations.  Patient states she is not suicidal or homicidal     ED Results / Procedures / Treatments   Labs (all labs ordered are listed, but only abnormal results are displayed) Labs Reviewed  CBC WITH DIFFERENTIAL/PLATELET - Abnormal; Notable for the following components:      Result Value   RBC 3.60 (*)    Hemoglobin 11.4 (*)    HCT 33.7 (*)    All other components within normal limits  COMPREHENSIVE METABOLIC PANEL - Abnormal; Notable for the following components:   Potassium 2.9 (*)    Glucose, Bld 121 (*)    Calcium 8.8 (*)    All other components within normal limits  RAPID URINE DRUG SCREEN, HOSP PERFORMED  ETHANOL    EKG None  Radiology No results found.  Procedures Procedures    Medications Ordered in ED Medications  LORazepam (ATIVAN) tablet 1 mg (1 mg Oral Given 04/22/23 1325)    ED Course/ Medical Decision Making/ A&P                             Medical Decision Making Amount and/or Complexity of Data Reviewed Labs: ordered.  Risk Prescription drug management.  This patient presents to the ED for concern of auditory hallucinations, this involves an extensive number of treatment options, and is a complaint that carries with it a high risk of complications and morbidity.  The differential diagnosis includes schizophrenia, substance abuse   Co morbidities that complicate the patient evaluation  History of schizophrenia   Additional history obtained:  Additional history obtained from patient External records from outside source obtained and reviewed including hospital records   Lab Tests:  I Ordered, and personally interpreted labs.  The pertinent results include: Potassium 2.9   Imaging  Studies ordered:  No imaging  Cardiac Monitoring: / EKG:  The patient was maintained on a cardiac monitor.  I personally viewed and interpreted the cardiac monitored which showed an underlying rhythm of: Normal sinus rhythm   Consultations Obtained:  I requested consultation with the behavioral health,  and discussed lab and imaging findings as well as pertinent plan - they recommend: Pending results   Problem List / ED Course / Critical interventions / Medication management  Schizophrenia and auditory hallucinations I ordered medication including Ativan for anxiety Reevaluation of the patient after  these medicines showed that the patient improved I have reviewed the patients home medicines and have made adjustments as needed   Social Determinants of Health:  None   Test / Admission - Considered:  None  Patient with auditory hallucinations and history of schizophrenia.  Behavioral health will consult and disposition will be determined by behavioral health        Final Clinical Impression(s) / ED Diagnoses Final diagnoses:  None    Rx / DC Orders ED Discharge Orders     None         Bethann Berkshire, MD 04/25/23 1140

## 2023-04-22 NOTE — ED Notes (Signed)
Pt received dinner tray.

## 2023-04-22 NOTE — ED Triage Notes (Signed)
Rachel Ray, said call came in as chest pain but after conversing with the pt. It was determined that its a behavior al health issue instead. Pt too Seroquel, felt it wasn't working so dr increased the dosage and she still did not take because she felt it wasn't working for her and she didn't want to "feel" tired or sleepy. Pt has started "hearing a whistle"  and per EMS, stated a spirit wanted to have sexual relations this am and she refused and became panicky and called EMS. Has history of anxiety, depression, and schizophrenia
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
## 2023-04-22 NOTE — ED Notes (Signed)
Pt changed into BH scrubs, belongings placed into belongings bag including, gown, sweater, pants, shoes, personal phone, necklace, and bra. Pt belongings secured in pt locker number 12. Urinalysis obtained, pt cooperative with staff, security wanded pt at this time.

## 2023-04-22 NOTE — ED Provider Notes (Signed)
Patient medically cleared and awaiting behavioral health consult   Bethann Berkshire, MD 04/22/23 1328

## 2023-04-23 DIAGNOSIS — F411 Generalized anxiety disorder: Secondary | ICD-10-CM | POA: Diagnosis not present

## 2023-04-23 LAB — I-STAT CHEM 8, ED
BUN: 8 mg/dL (ref 6–20)
Calcium, Ion: 1.17 mmol/L (ref 1.15–1.40)
Chloride: 103 mmol/L (ref 98–111)
Creatinine, Ser: 0.7 mg/dL (ref 0.44–1.00)
Glucose, Bld: 111 mg/dL — ABNORMAL HIGH (ref 70–99)
HCT: 38 % (ref 36.0–46.0)
Hemoglobin: 12.9 g/dL (ref 12.0–15.0)
Potassium: 3.4 mmol/L — ABNORMAL LOW (ref 3.5–5.1)
Sodium: 142 mmol/L (ref 135–145)
TCO2: 30 mmol/L (ref 22–32)

## 2023-04-23 MED ORDER — LORAZEPAM 1 MG PO TABS
1.0000 mg | ORAL_TABLET | Freq: Once | ORAL | Status: AC
  Administered 2023-04-23: 1 mg via ORAL
  Filled 2023-04-23: qty 1

## 2023-04-23 NOTE — ED Notes (Signed)
Pt accepted to [facility] for reassessment, Roselyn Bering NP. accepting [phone] call for report.

## 2023-04-23 NOTE — ED Notes (Signed)
Pt is calm and cooperative, denies SI/HI/AVH. Pt is AxO x4. Pt states that she feels anxious and requests something for anxiety, MD made aware. Spoke with daughter on phone and updated on plan of care.

## 2023-04-23 NOTE — ED Notes (Signed)
Pt resting vs deferred until am.

## 2023-04-23 NOTE — ED Provider Notes (Signed)
Patient is to be transferred to behavioral health urgent care in [facility] for further evaluation by them.  Patient accepted by nurse practitioner Bobbitt.  Currently stable.   Vanetta Mulders, MD 04/23/23 [phone]

## 2023-04-24 ENCOUNTER — Ambulatory Visit (HOSPITAL_COMMUNITY)
Admission: EM | Admit: 2023-04-24 | Discharge: 2023-04-24 | Disposition: A | Discharge status: Home / Self Care | Payer: 59 | Attending: Nurse Practitioner | Admitting: Nurse Practitioner

## 2023-04-24 ENCOUNTER — Other Ambulatory Visit: Payer: Self-pay

## 2023-04-24 DIAGNOSIS — F331 Major depressive disorder, recurrent, moderate: Secondary | ICD-10-CM

## 2023-04-24 DIAGNOSIS — Z62811 Personal history of psychological abuse in childhood: Secondary | ICD-10-CM | POA: Diagnosis not present

## 2023-04-24 DIAGNOSIS — F209 Schizophrenia, unspecified: Secondary | ICD-10-CM | POA: Diagnosis present

## 2023-04-24 DIAGNOSIS — F411 Generalized anxiety disorder: Secondary | ICD-10-CM

## 2023-04-24 DIAGNOSIS — F410 Panic disorder [episodic paroxysmal anxiety] without agoraphobia: Secondary | ICD-10-CM | POA: Insufficient documentation

## 2023-04-24 DIAGNOSIS — R079 Chest pain, unspecified: Secondary | ICD-10-CM | POA: Diagnosis not present

## 2023-04-24 DIAGNOSIS — Z79899 Other long term (current) drug therapy: Secondary | ICD-10-CM | POA: Insufficient documentation

## 2023-04-24 DIAGNOSIS — Z62810 Personal history of physical and sexual abuse in childhood: Secondary | ICD-10-CM | POA: Diagnosis not present

## 2023-04-24 MED ORDER — MAGNESIUM HYDROXIDE 400 MG/5ML PO SUSP: 30.0000 mL | Freq: Every day | ORAL | Status: DC | PRN

## 2023-04-24 MED ORDER — QUETIAPINE FUMARATE 200 MG PO TABS
200.0000 mg | ORAL_TABLET | Freq: Two times a day (BID) | ORAL | Status: DC
  Administered 2023-04-24: 200 mg via ORAL
  Filled 2023-04-24: qty 1

## 2023-04-24 MED ORDER — CLONIDINE HCL 0.1 MG PO TABS
0.2000 mg | ORAL_TABLET | Freq: Two times a day (BID) | ORAL | Status: DC
  Administered 2023-04-24 (×2): 0.2 mg via ORAL
  Filled 2023-04-24 (×2): qty 2

## 2023-04-24 MED ORDER — GABAPENTIN 100 MG PO CAPS
100.0000 mg | ORAL_CAPSULE | Freq: Three times a day (TID) | ORAL | Status: DC
  Administered 2023-04-24 (×2): 100 mg via ORAL
  Filled 2023-04-24 (×2): qty 1

## 2023-04-24 MED ORDER — POTASSIUM CHLORIDE ER 10 MEQ PO TBCR
10.0000 meq | EXTENDED_RELEASE_TABLET | Freq: Every day | ORAL | Status: DC
  Administered 2023-04-24: 10 meq via ORAL
  Filled 2023-04-24 (×2): qty 1

## 2023-04-24 MED ORDER — LORAZEPAM 1 MG PO TABS
1.0000 mg | ORAL_TABLET | Freq: Two times a day (BID) | ORAL | Status: DC | PRN
  Administered 2023-04-24: 1 mg via ORAL
  Filled 2023-04-24: qty 1

## 2023-04-24 MED ORDER — ACETAMINOPHEN 325 MG PO TABS
650.0000 mg | ORAL_TABLET | Freq: Four times a day (QID) | ORAL | Status: DC | PRN
  Administered 2023-04-24: 650 mg via ORAL
  Filled 2023-04-24: qty 2

## 2023-04-24 MED ORDER — ALUM & MAG HYDROXIDE-SIMETH 200-200-20 MG/5ML PO SUSP: 30.0000 mL | ORAL | Status: DC | PRN

## 2023-04-24 MED ORDER — TRAZODONE HCL 50 MG PO TABS
50.0000 mg | ORAL_TABLET | Freq: Every evening | ORAL | Status: DC | PRN
  Administered 2023-04-24: 50 mg via ORAL
  Filled 2023-04-24: qty 1

## 2023-04-24 NOTE — ED Notes (Signed)
Patient in milieu. Environment is secured. Will continue to monitor for safety. 

## 2023-04-24 NOTE — ED Notes (Signed)
Pt awake and laying in bed, no distress or pain noted. Will continue to monitor for safety

## 2023-04-24 NOTE — ED Notes (Signed)
Pt was given cereal, muffin, and milk for breakfast.  

## 2023-04-24 NOTE — Progress Notes (Signed)
   04/24/23 0032  [place] Suicide Severity Rating Scale  1. Wish to be Dead No  2. Suicidal Thoughts No  6. Suicide Behavior Question No  C-SSRS RISK CATEGORY No Risk

## 2023-04-24 NOTE — Progress Notes (Signed)
Per Olin Pia, NP pt does not meet inpatient behavioral health criteria. This CSW added outpatient Behavioral health resources in pt's AVS.    Maryjean Ka, MSW, [facility] 04/24/2023 12:39 PM

## 2023-04-24 NOTE — ED Triage Notes (Signed)
Pt presents to [facility] following Hospital discharge. Pt denies SI/HI at this time. Pt reports expercining anxiety and panic attacks for over a week. Pt does endorse Auditory hallunications and reports paranoia of evil spirits around her. Pt is urgent.

## 2023-04-24 NOTE — ED Notes (Signed)
Pt A&O x 4, transfer from [facility], presents with depression, anxiety and paranoia about evil spirits.  Denies SI, HI.  Pt cooperative and anxious at present.  Monitoring for safety.

## 2023-04-24 NOTE — Discharge Instructions (Signed)

## 2023-04-24 NOTE — Progress Notes (Signed)
   04/24/23 0028  BHUC Triage Screening (Walk-ins at [facility] only)  How Did You Hear About [place]? Hospital Discharge  What Is the Reason for Your Visit/Call Today? Pt presents to [facility] following Hospital discharge. Pt denies SI/HI at this time. Pt reports expercining anxiety and panic attacks for over a week. Pt does endorse Auditory hallunications and reports paranoia of evil spirits around her. Pt is urgent.  How Long Has This Been Causing You Problems? <Week  Have You Recently Had Any Thoughts About Hurting Yourself? No  Are You Planning to Commit Suicide/Harm Yourself At This time? No  Have you Recently Had Thoughts About Hurting Someone [name]? No  Are You Planning To Harm Someone At This Time? No  Are you currently experiencing any auditory, visual or other hallucinations? Yes  Please explain the hallucinations you are currently experiencing: [place] (Evil spirits surrounding me) Auditory  Have You Used Any Alcohol or Drugs in the Past 24 Hours? No  Do you have any current medical co-morbidities that require immediate attention? No  Clinician description of patient physical appearance/behavior: fairly groomed, calm  What Do You Feel Would Help You the Most Today? Treatment for Depression or other mood problem  If access to [facility] was not available, would you have sought care in the Emergency Department? Yes  Determination of Need Urgent (48 hours)  Options For Referral [facility]

## 2023-04-24 NOTE — Progress Notes (Signed)
   04/24/23 0033  Vital Signs  Temp 97.8 F (36.6 C)  Temp Source Oral  Pulse Rate 76  Pulse Rate Source Dinamap  Resp 18  BP (!) 129/99  BP Location Left Arm  BP Method Automatic  Patient Position (if appropriate) Sitting  Oxygen Therapy  SpO2 100 %  O2 Device Room Air

## 2023-04-24 NOTE — ED Notes (Signed)
Pt was given a lunch box ane juice for lunch.
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
## 2023-04-24 NOTE — Progress Notes (Signed)
   04/24/23 0033  Vital Signs  Temp 97.8 F (36.6 C)  Temp Source Oral  Pulse Rate 76  Pulse Rate Source Dinamap  Resp 18  BP 129/89  BP Location Left Arm  BP Method Automatic  Patient Position (if appropriate) Sitting  Oxygen Therapy  SpO2 100 %  O2 Device Room Air

## 2023-04-24 NOTE — ED Provider Notes (Signed)
FBC/OBS ASAP Discharge Summary  Date and Time: 04/24/2023 12:16 PM  Name: Rachel Ray  MRN:  [id number]   Discharge Diagnoses:  Final diagnoses:  Schizophrenia, unspecified type (HCC)  GAD (generalized anxiety disorder)  MDD (major depressive disorder), recurrent episode, moderate (HCC)    Subjective: "I am good...these medications made me feel better..."  Stay Summary: Rachel Ray is a 42 year-old female admitted to Observation unit on last night secondary to increased anxiety and auditory hallucinations.  Per admission note: patient reported that she had stopped taking medications due to her medications being taken to the wrong pharmacy.  Patient called EMS on 04/22/23  complaining of chest pain. She was taken to [facility] and reported to staff that she was  experiencing auditory hallucinations. She had not had hallucinations in a year. Patient reported that she currently lives with her adult children who take advantage of her. She reported not having anyone to talk to. She reported a hx of suicide attempt when she was a teen.  Patient was admitted to observation unit for continuous assessment and stabilization.    Assessment: Patient is evaluated face-to-face by this provider and chart/vital signs reviewed. 42 year old female who is pleasant on approach. She appears disheveled  with decent hygiene. She is alert and oriented x 4. Patient is engaged in the conversation and remains focused. She denies SI/HI/AVH. She does not appear to be responding to internal stimuli.  Her thought process is clear and goal directed. She denies generalized pain/weakness. Denies headaches or dizziness. Denies neck/back/chest pain. Denies abdominal discomfort. Denies joint/muscle pain. Denies medical/health concerns. Patient admits that she was feeling overwhelmed by family situation and "I really needed to be away from home". She reports that she was not taking medications and was not seeing her PCP as recommended. She  reports that she is supposed to be following up at [facility] on [facility]). She is planning to call as soon as Monday to schedule a follow up appointment. Patient reports that her current medications work well and wants to keep the same regimen.    Provider discussed with patient about additional resources in the community for support. She reported that she will reach out and will start therapy as soon as she is scheduled.   Provider consulted with DR Jannifer Franklin for disposition and he agreed on discharging patient with additional resources. Patient is willing to continue services with [facility] in [place].    Total Time spent with patient: 30 minutes  Past Psychiatric History: MDD, PTSD, Schizophrenia, GAD Past Medical History: NA Family History: NA Family Psychiatric History: NA Social History: Divorced. Lives with her adult children. Tobacco Cessation:  N/A, patient does not currently use tobacco products  Current Medications:  Current Facility-Administered Medications  Medication Dose Route Frequency Provider Last Rate Last Admin   acetaminophen (TYLENOL) tablet 650 mg  650 mg Oral Q6H PRN Bobbitt, Shalon E, NP   650 mg at 04/24/23 0831   alum & mag hydroxide-simeth (MAALOX/MYLANTA) 200-200-20 MG/5ML suspension 30 mL  30 mL Oral Q4H PRN Bobbitt, Shalon E, NP       cloNIDine (CATAPRES) tablet 0.2 mg  0.2 mg Oral BID Bobbitt, Shalon E, NP   0.2 mg at 04/24/23 1002   gabapentin (NEURONTIN) capsule 100 mg  100 mg Oral TID Bobbitt, Shalon E, NP   100 mg at 04/24/23 1002   LORazepam (ATIVAN) tablet 1 mg  1 mg Oral BID PRN Bobbitt, Shalon E, NP   1 mg at 04/24/23  0129   magnesium hydroxide (MILK OF MAGNESIA) suspension 30 mL  30 mL Oral Daily PRN Bobbitt, Shalon E, NP       potassium chloride (KLOR-CON) CR tablet 10 mEq  10 mEq Oral Daily Bobbitt, Shalon E, NP   10 mEq at 04/24/23 1002   QUEtiapine (SEROQUEL) tablet 200 mg  200 mg Oral BID Bobbitt, Shalon E, NP   200 mg at 04/24/23 1002    traZODone (DESYREL) tablet 50 mg  50 mg Oral QHS PRN Bobbitt, Shalon E, NP   50 mg at 04/24/23 0129   Current Outpatient Medications  Medication Sig Dispense Refill   amoxicillin (AMOXIL) 500 MG capsule Take 500 mg by mouth 3 (three) times daily. (Patient not taking: Reported on 04/22/2023)     Buprenorphine HCl-Naloxone HCl 8-2 MG FILM Place 1 Film under the tongue 4 (four) times daily. dissolve 1/2 film under tongue 4 times daily     cloNIDine (CATAPRES) 0.2 MG tablet Take 0.2 mg by mouth 2 (two) times daily.     cyclobenzaprine (FLEXERIL) 10 MG tablet Take 10 mg by mouth 3 (three) times daily. (Patient not taking: Reported on 04/22/2023)     divalproex (DEPAKOTE) 250 MG DR tablet Take 250 mg by mouth daily. (Patient not taking: Reported on 04/22/2023)     gabapentin (NEURONTIN) 100 MG capsule Take 100 mg by mouth 3 (three) times daily.     hydrOXYzine (VISTARIL) 50 MG capsule Take 50 mg by mouth at bedtime as needed.     nystatin (MYCOSTATIN/NYSTOP) powder Apply 1 Application topically 3 (three) times daily.     potassium chloride (KLOR-CON) 10 MEQ tablet Take 10 mEq by mouth daily.     QUEtiapine (SEROQUEL) 100 MG tablet Take 200 mg by mouth 2 (two) times daily. (Patient not taking: Reported on 04/22/2023)     VRAYLAR 1.5 MG capsule Take 1.5 mg by mouth daily.      PTA Medications:  Facility Ordered Medications  Medication   acetaminophen (TYLENOL) tablet 650 mg   alum & mag hydroxide-simeth (MAALOX/MYLANTA) 200-200-20 MG/5ML suspension 30 mL   magnesium hydroxide (MILK OF MAGNESIA) suspension 30 mL   traZODone (DESYREL) tablet 50 mg   cloNIDine (CATAPRES) tablet 0.2 mg   QUEtiapine (SEROQUEL) tablet 200 mg   potassium chloride (KLOR-CON) CR tablet 10 mEq   LORazepam (ATIVAN) tablet 1 mg   gabapentin (NEURONTIN) capsule 100 mg   PTA Medications  Medication Sig   QUEtiapine (SEROQUEL) 100 MG tablet Take 200 mg by mouth 2 (two) times daily. (Patient not taking: Reported on 04/22/2023)    amoxicillin (AMOXIL) 500 MG capsule Take 500 mg by mouth 3 (three) times daily. (Patient not taking: Reported on 04/22/2023)   VRAYLAR 1.5 MG capsule Take 1.5 mg by mouth daily.   cloNIDine (CATAPRES) 0.2 MG tablet Take 0.2 mg by mouth 2 (two) times daily.   cyclobenzaprine (FLEXERIL) 10 MG tablet Take 10 mg by mouth 3 (three) times daily. (Patient not taking: Reported on 04/22/2023)   divalproex (DEPAKOTE) 250 MG DR tablet Take 250 mg by mouth daily. (Patient not taking: Reported on 04/22/2023)   gabapentin (NEURONTIN) 100 MG capsule Take 100 mg by mouth 3 (three) times daily.   hydrOXYzine (VISTARIL) 50 MG capsule Take 50 mg by mouth at bedtime as needed.   nystatin (MYCOSTATIN/NYSTOP) powder Apply 1 Application topically 3 (three) times daily.   potassium chloride (KLOR-CON) 10 MEQ tablet Take 10 mEq by mouth daily.   Buprenorphine HCl-Naloxone HCl 8-2  MG FILM Place 1 Film under the tongue 4 (four) times daily. dissolve 1/2 film under tongue 4 times daily       04/24/2023   12:15 PM 10/20/2016    3:04 PM  Depression screen PHQ 2/9  Decreased Interest 1 0  Down, Depressed, Hopeless 1 0  PHQ - 2 Score 2 0  Altered sleeping 1   Tired, decreased energy 1   Change in appetite 0   Feeling bad or failure about yourself  0   Trouble concentrating 1   Moving slowly or fidgety/restless 0   Suicidal thoughts 0   PHQ-9 Score 5   Difficult doing work/chores Somewhat difficult     Flowsheet Row ED from 04/24/2023 in [facility] ED from 04/22/2023 in [facility] Emergency Department at [facility] ED from 09/29/2021 in [facility] Emergency Department at [facility]  C-SSRS RISK CATEGORY No Risk No Risk No Risk       Musculoskeletal  Strength & Muscle Tone: within normal limits Gait & Station: normal Patient leans: N/A  Psychiatric Specialty Exam  Presentation  General Appearance:  Appropriate for Environment  Eye  Contact: Good  Speech: Clear and Coherent; Normal Rate  Speech Volume: Normal  Handedness: Right   Mood and Affect  Mood: Euthymic  Affect: Appropriate   Thought Process  Thought Processes: Coherent  Descriptions of Associations:Intact  Orientation:Full (Time, Place and Person)  Thought Content:Logical  Diagnosis of Schizophrenia or Schizoaffective disorder in past: Yes  Duration of Psychotic Symptoms: Greater than six months   Hallucinations:Hallucinations: None Description of Auditory Hallucinations: hearing spirits  Ideas of Reference:None  Suicidal Thoughts:Suicidal Thoughts: No  Homicidal Thoughts:Homicidal Thoughts: No   Sensorium  Memory: Immediate Good  Judgment: Good  Insight: Good   Executive Functions  Concentration: Fair  Attention Span: Good  Recall: Good  Fund of Knowledge: Good  Language: Good   Psychomotor Activity  Psychomotor Activity: Psychomotor Activity: Normal   Assets  Assets: Communication Skills; Desire for Improvement; Physical Health; Resilience   Sleep  Sleep: Sleep: Good Number of Hours of Sleep: 9   Nutritional Assessment (For OBS and FBC admissions only) Has the patient had a weight loss or gain of 10 pounds or more in the last 3 months?: No Has the patient had a decrease in food intake/or appetite?: No Does the patient have dental problems?: No Does the patient have eating habits or behaviors that may be indicators of an eating disorder including binging or inducing vomiting?: No Has the patient recently lost weight without trying?: 0 Has the patient been eating poorly because of a decreased appetite?: 0 Malnutrition Screening Tool Score: 0    Physical Exam  Physical Exam Vitals and nursing note reviewed.  Constitutional:      Appearance: Normal appearance.  HENT:     Head: Normocephalic and atraumatic.     Right Ear: Tympanic membrane normal.     Left Ear: Tympanic membrane  normal.     Nose: Nose normal.  Eyes:     Pupils: Pupils are equal, round, and reactive to light.  Cardiovascular:     Rate and Rhythm: Normal rate.     Pulses: Normal pulses.  Pulmonary:     Effort: Pulmonary effort is normal.  Musculoskeletal:        General: Normal range of motion.     Cervical back: Normal range of motion and neck supple.  Skin:    General: Skin is dry.  Neurological:     General: No focal deficit present.     Mental Status: She is oriented to person, place, and time.    Review of Systems  Constitutional: Negative.   HENT: Negative.    Eyes: Negative.   Respiratory: Negative.    Cardiovascular: Negative.   Gastrointestinal: Negative.   Genitourinary: Negative.   Musculoskeletal: Negative.   Skin: Negative.   Neurological: Negative.   Endo/Heme/Allergies: Negative.   Psychiatric/Behavioral:  Positive for depression. The patient is nervous/anxious.    Blood pressure (!) 131/92, pulse 84, temperature 98.4 F (36.9 C), temperature source Oral, resp. rate 17, last menstrual period 03/13/2016, SpO2 98 %. There is no height or weight on file to calculate BMI.  Demographic Factors:  Low socioeconomic status  Loss Factors: NA  Historical Factors: NA  Risk Reduction Factors:   Living with another person, especially a relative  Continued Clinical Symptoms:  Depression:   Impulsivity Schizophrenia:   Depressive state  Cognitive Features That Contribute To Risk:  None    Suicide Risk:  Minimal: No identifiable suicidal ideation.  Patients presenting with no risk factors but with morbid ruminations; may be classified as minimal risk based on the severity of the depressive symptoms  Plan Of Care/Follow-up recommendations:  Other:  Follow up at [facility] in [place]  Disposition: Discharge to current residence  [place], NP 04/24/2023, 12:16 PM

## 2023-06-23 ENCOUNTER — Other Ambulatory Visit: Payer: Self-pay

## 2023-06-23 ENCOUNTER — Encounter (HOSPITAL_COMMUNITY): Payer: Self-pay | Admitting: Pharmacy Technician

## 2023-06-23 ENCOUNTER — Emergency Department (HOSPITAL_COMMUNITY)
Admission: EM | Admit: 2023-06-23 | Discharge: 2023-06-24 | Disposition: A | Discharge status: Home / Self Care | Payer: 59 | Attending: Student | Admitting: Student

## 2023-06-23 DIAGNOSIS — R443 Hallucinations, unspecified: Secondary | ICD-10-CM | POA: Insufficient documentation

## 2023-06-23 DIAGNOSIS — E876 Hypokalemia: Secondary | ICD-10-CM | POA: Insufficient documentation

## 2023-06-23 DIAGNOSIS — Z79899 Other long term (current) drug therapy: Secondary | ICD-10-CM | POA: Insufficient documentation

## 2023-06-23 DIAGNOSIS — Z87891 Personal history of nicotine dependence: Secondary | ICD-10-CM | POA: Diagnosis not present

## 2023-06-23 DIAGNOSIS — F29 Unspecified psychosis not due to a substance or known physiological condition: Secondary | ICD-10-CM

## 2023-06-23 DIAGNOSIS — F209 Schizophrenia, unspecified: Secondary | ICD-10-CM | POA: Diagnosis present

## 2023-06-23 DIAGNOSIS — I10 Essential (primary) hypertension: Secondary | ICD-10-CM | POA: Diagnosis not present

## 2023-06-23 LAB — COMPREHENSIVE METABOLIC PANEL
ALT: 31 U/L (ref 0–44)
AST: 32 U/L (ref 15–41)
Albumin: 3.9 g/dL (ref 3.5–5.0)
Alkaline Phosphatase: 53 U/L (ref 38–126)
Anion gap: 11 (ref 5–15)
BUN: 10 mg/dL (ref 6–20)
CO2: 23 mmol/L (ref 22–32)
Calcium: 9 mg/dL (ref 8.9–10.3)
Chloride: 103 mmol/L (ref 98–111)
Creatinine, Ser: 0.69 mg/dL (ref 0.44–1.00)
GFR, Estimated: >60 mL/min (ref >60)
Glucose, Bld: 124 mg/dL — ABNORMAL HIGH (ref 70–99)
Potassium: 3.1 mmol/L — ABNORMAL LOW (ref 3.5–5.1)
Sodium: 137 mmol/L (ref 135–145)
Total Bilirubin: 0.6 mg/dL (ref 0.3–1.2)
Total Protein: 7.9 g/dL (ref 6.5–8.1)

## 2023-06-23 LAB — URINALYSIS, ROUTINE W REFLEX MICROSCOPIC
Bilirubin Urine: NEGATIVE
Glucose, UA: NEGATIVE mg/dL
Hgb urine dipstick: NEGATIVE
Ketones, ur: 20 mg/dL — AB
Leukocytes,Ua: NEGATIVE
Nitrite: NEGATIVE
Protein, ur: 30 mg/dL — AB
Specific Gravity, Urine: 1.021 (ref 1.005–1.030)
pH: 5 (ref 5.0–8.0)

## 2023-06-23 LAB — CBC WITH DIFFERENTIAL/PLATELET
Abs Immature Granulocytes: 0.01 10*3/uL (ref 0.00–0.07)
Basophils Absolute: 0 10*3/uL (ref 0.0–0.1)
Basophils Relative: 0 %
Eosinophils Absolute: 0 10*3/uL (ref 0.0–0.5)
Eosinophils Relative: 0 %
HCT: 37.5 % (ref 36.0–46.0)
Hemoglobin: 12.3 g/dL (ref 12.0–15.0)
Immature Granulocytes: 0 %
Lymphocytes Relative: 25 %
Lymphs Abs: 1.6 10*3/uL (ref 0.7–4.0)
MCH: 30.4 pg (ref 26.0–34.0)
MCHC: 32.8 g/dL (ref 30.0–36.0)
MCV: 92.8 fL (ref 80.0–100.0)
Monocytes Absolute: 0.5 10*3/uL (ref 0.1–1.0)
Monocytes Relative: 7 %
Neutro Abs: 4.5 10*3/uL (ref 1.7–7.7)
Neutrophils Relative %: 68 %
Platelets: 188 10*3/uL (ref 150–400)
RBC: 4.04 MIL/uL (ref 3.87–5.11)
RDW: 12.2 % (ref 11.5–15.5)
WBC: 6.6 10*3/uL (ref 4.0–10.5)
nRBC: 0 % (ref 0.0–0.2)

## 2023-06-23 LAB — RAPID URINE DRUG SCREEN, HOSP PERFORMED
Amphetamines: NOT DETECTED
Barbiturates: NOT DETECTED
Benzodiazepines: POSITIVE — AB
Cocaine: NOT DETECTED
Opiates: NOT DETECTED
Tetrahydrocannabinol: NOT DETECTED

## 2023-06-23 MED ORDER — ALPRAZOLAM 0.5 MG PO TABS
0.5000 mg | ORAL_TABLET | Freq: Once | ORAL | Status: AC
  Administered 2023-06-23: 0.5 mg via ORAL
  Filled 2023-06-23: qty 1

## 2023-06-23 MED ORDER — LORAZEPAM 1 MG PO TABS
1.0000 mg | ORAL_TABLET | Freq: Once | ORAL | Status: AC
  Administered 2023-06-23: 1 mg via ORAL
  Filled 2023-06-23: qty 1

## 2023-06-23 MED ORDER — OLANZAPINE 5 MG PO TBDP: 10.0000 mg | ORAL_TABLET | Freq: Three times a day (TID) | ORAL | Status: DC | PRN

## 2023-06-23 MED ORDER — DIVALPROEX SODIUM 250 MG PO DR TAB
250.0000 mg | DELAYED_RELEASE_TABLET | Freq: Once | ORAL | Status: DC
  Filled 2023-06-23: qty 1

## 2023-06-23 MED ORDER — QUETIAPINE FUMARATE 100 MG PO TABS
200.0000 mg | ORAL_TABLET | Freq: Two times a day (BID) | ORAL | Status: DC
  Administered 2023-06-23: 200 mg via ORAL
  Filled 2023-06-23 (×2): qty 2

## 2023-06-23 MED ORDER — ZIPRASIDONE MESYLATE 20 MG IM SOLR
20.0000 mg | INTRAMUSCULAR | Status: AC | PRN
  Administered 2023-06-23: 20 mg via INTRAMUSCULAR
  Filled 2023-06-23: qty 20

## 2023-06-23 MED ORDER — CARIPRAZINE HCL 1.5 MG PO CAPS
1.5000 mg | ORAL_CAPSULE | Freq: Every day | ORAL | Status: DC
  Filled 2023-06-23: qty 1

## 2023-06-23 MED ORDER — LORAZEPAM 1 MG PO TABS: 1.0000 mg | ORAL_TABLET | ORAL | Status: DC | PRN

## 2023-06-23 MED ORDER — ACETAMINOPHEN 325 MG PO TABS
650.0000 mg | ORAL_TABLET | Freq: Once | ORAL | Status: AC
  Administered 2023-06-23: 650 mg via ORAL
  Filled 2023-06-23: qty 2

## 2023-06-23 NOTE — ED Notes (Signed)
TTS in progress 

## 2023-06-23 NOTE — ED Notes (Signed)
Pt loudly repeating "pull all the cords, draw your swords everybody".

## 2023-06-23 NOTE — Consult Note (Signed)
[facility] Consult Note  Patient Name: Rachel Ray MRN: [id number] DOB: [birth year] DATE OF Consult: 06/23/2023  PRIMARY PSYCHIATRIC DIAGNOSES  1.  Schizophrenia by hx 2.   3.   RECOMMENDATIONS  Recommendations: Medication recommendations: Resume home medications, Zyprexa 10 mg PO/IM BID PRN for agitation/aggression/psychosis Non-Medication/therapeutic recommendations: Inpatient psychiatric admission Is inpatient psychiatric hospitalization recommended for this patient? Yes (Explain why): Potential danger to self or others From a psychiatric perspective, is this patient appropriate for discharge to an outpatient setting/resource or other less restrictive environment for continued care?  No (Explain why): Potential danger to self or others Follow-Up Telepsychiatry C/L services: We will sign off for now. Please re-consult our service if needed for any concerning changes in the patient's condition, discharge planning, or questions. Communication: Treatment team members (and family members if applicable) who were involved in treatment/care discussions and planning, and with whom we spoke or engaged with via secure text/chat, include the following: Macky Lower, RN; Dr. Durwin Nora; Selena Batten, RN; Estill Dooms, [facility]  Thank you for involving [place] in the care of this patient. If you have any additional questions or concerns, please call [phone] and ask for me or the provider on-call.  TELEPSYCHIATRY ATTESTATION & CONSENT  As the provider for this telehealth consult, I attest that I verified the patient's identity using two separate identifiers, introduced myself to the patient, provided my credentials, disclosed my location, and performed this encounter via a HIPAA-compliant, real-time, face-to-face, two-way, interactive audio and video platform and with the full consent and agreement of the patient (or guardian as applicable.)  Patient physical location: [facility]. Telehealth provider  physical location: home office in state of [place].  Video start time: [facility] Time) Video end time: [facility] Time)  IDENTIFYING DATA  Rachel Ray is a 42 y.o. year-old female for whom a psychiatric consultation has been ordered by the primary provider. The patient was identified using two separate identifiers.  CHIEF COMPLAINT/REASON FOR CONSULT  Paranoia, delusional thinking  HISTORY OF PRESENT ILLNESS (HPI)  The patient presented to the ED with c/o paranoia and delusional thinking.   Per chart, pt hasn't slept in at least 24 hours and hasn't taken her medications in at least 3 days.  Pt's daughter called with concern due to pt was wandering the streets with religious pre-occupation and paranoia that their house was going to explode.    Upon evaluation, pt admitted to not taking her medications for several days because she felt like she was on too many medications and wanted to reduce them.  Pt had apparently received a chemical restraint due to becoming agitated and attempting to leave the hospital.  Pt was visibly sedated and had a difficult time following the evaluation.  She reiterated that she just wants to go home tonight.  Presented with loose associations, stating "Son of Onalee Hua" when asked about family hx of MH and then stating her mother and that is the church she attends.  Pt does not appear to be thinking clearly even beyond the sedation from the medication.  Initially stated she was experiencing hallucinations and then reported that was a couple of months ago and denied any current hallucinations.  Pt meets criteria for involuntary psychiatric admission for further evaluation and stabilization. Recommend resuming home medications as prescribed and Zyprexa 10 mg PO/IM BID PRN for agitation/aggression/psychosis.   PAST PSYCHIATRIC HISTORY   Otherwise as per HPI above.  PAST MEDICAL HISTORY  Past Medical History:  Diagnosis Date   Anemia  Anxiety    Arthritis     rheumatoid   Chronic pelvic pain in female    Depression    Dysmenorrhea    Hypertension    Menorrhagia    Uterine fibroid      HOME MEDICATIONS  Facility Ordered Medications  Medication   QUEtiapine (SEROQUEL) tablet 200 mg   [COMPLETED] LORazepam (ATIVAN) tablet 1 mg   [COMPLETED] acetaminophen (TYLENOL) tablet 650 mg   [COMPLETED] ALPRAZolam (XANAX) tablet 0.5 mg   OLANZapine zydis (ZYPREXA) disintegrating tablet 10 mg   And   LORazepam (ATIVAN) tablet 1 mg   And   [COMPLETED] ziprasidone (GEODON) injection 20 mg   PTA Medications  Medication Sig   ALPRAZolam (XANAX) 1 MG tablet Take by mouth.   Buprenorphine HCl-Naloxone HCl 8-2 MG FILM Place under the tongue.   divalproex (DEPAKOTE) 250 MG DR tablet Take 250 mg by mouth 2 (two) times daily.   hydrochlorothiazide (HYDRODIURIL) 25 MG tablet Take 25 mg by mouth daily.   hydrOXYzine (VISTARIL) 50 MG capsule Take 50 mg by mouth at bedtime as needed.   cloNIDine (CATAPRES) 0.1 MG tablet Take by mouth daily.   QUEtiapine (SEROQUEL) 100 MG tablet Take 200 mg by mouth 2 (two) times daily. (Patient not taking: Reported on 04/22/2023)   cloNIDine (CATAPRES) 0.2 MG tablet Take 0.2 mg by mouth 2 (two) times daily.   gabapentin (NEURONTIN) 100 MG capsule Take 100 mg by mouth 3 (three) times daily.   potassium chloride (KLOR-CON) 10 MEQ tablet Take 10 mEq by mouth daily.     ALLERGIES  PCN, Shellfish  SOCIAL & SUBSTANCE USE HISTORY  Social History   Socioeconomic History   Marital status: Legally Separated    Spouse name: Not on file   Number of children: Not on file   Years of education: Not on file   Highest education level: Not on file  Occupational History   Not on file  Tobacco Use   Smoking status: Former    Current packs/day: 0.00    Average packs/day: 0.5 packs/day for 15.0 years (7.5 ttl pk-yrs)    Types: Cigarettes    Start date: 01/21/2005    Quit date: 01/22/2020    Years since quitting: 3.4   Smokeless tobacco:  Never  Substance and Sexual Activity   Alcohol use: No    Alcohol/week: 0.0 standard drinks of alcohol   Drug use: No   Sexual activity: Yes    Birth control/protection: Surgical  Other Topics Concern   Not on file  Social History Narrative   Not on file   Social Determinants of Health   Financial Resource Strain: Not on file  Food Insecurity: Not on file  Transportation Needs: Not on file  Physical Activity: Not on file  Stress: Not on file  Social Connections: Not on file   Social History   Tobacco Use  Smoking Status Former   Current packs/day: 0.00   Average packs/day: 0.5 packs/day for 15.0 years (7.5 ttl pk-yrs)   Types: Cigarettes   Start date: 01/21/2005   Quit date: 01/22/2020   Years since quitting: 3.4  Smokeless Tobacco Never   Social History   Substance and Sexual Activity  Alcohol Use No   Alcohol/week: 0.0 standard drinks of alcohol   Social History   Substance and Sexual Activity  Drug Use No    Additional pertinent information .  FAMILY HISTORY  Family History  Problem Relation Age of Onset   Diabetes Maternal Grandmother  Diabetes Maternal Grandfather    Cancer Other        great grandmother-breast cancer   Cancer Maternal Aunt        breast cancer   Family Psychiatric History (if known):  Mother - depression/anxiety,   MENTAL STATUS EXAM (MSE)  Presentation  General Appearance:  Appropriate for Environment  Eye Contact: Good  Speech: Clear and Coherent; Normal Rate  Speech Volume: Normal  Handedness: Right   Mood and Affect  Mood: Euthymic  Affect: Appropriate   Thought Process  Thought Processes: Coherent  Descriptions of Associations: Intact  Orientation: Full (Time, Place and Person)  Thought Content: Logical  History of Schizophrenia/Schizoaffective disorder: Yes  Duration of Psychotic Symptoms: Greater than six months  Hallucinations:Hallucinations: None  Ideas of  Reference: Delusions  Suicidal Thoughts:Suicidal Thoughts: No  Homicidal Thoughts:Homicidal Thoughts: No   Sensorium  Memory: Immediate Good  Judgment: Good  Insight: Good   Executive Functions  Concentration: Fair  Attention Span: Good  Recall: Good  Fund of Knowledge: Good  Language: Good   Psychomotor Activity  Psychomotor Activity:Psychomotor Activity: Decreased  Assets  Assets: Communication Skills; Desire for Improvement; Physical Health; Resilience   Sleep  Sleep:Sleep: Poor   VITALS  Blood pressure 119/82, pulse 80, temperature 97.9 F (36.6 C), resp. rate 16, last menstrual period 03/13/2016, SpO2 95%.  LABS  Admission on 06/23/2023  Component Date Value Ref Range Status   Opiates 06/23/2023 NONE DETECTED  NONE DETECTED Final   Cocaine 06/23/2023 NONE DETECTED  NONE DETECTED Final   Benzodiazepines 06/23/2023 POSITIVE (A)  NONE DETECTED Final   Amphetamines 06/23/2023 NONE DETECTED  NONE DETECTED Final   Tetrahydrocannabinol 06/23/2023 NONE DETECTED  NONE DETECTED Final   Barbiturates 06/23/2023 NONE DETECTED  NONE DETECTED Final   Comment: (NOTE) DRUG SCREEN FOR MEDICAL PURPOSES ONLY.  IF CONFIRMATION IS NEEDED FOR ANY PURPOSE, NOTIFY LAB WITHIN 5 DAYS.  LOWEST DETECTABLE LIMITS FOR URINE DRUG SCREEN Drug Class                     Cutoff (ng/mL) Amphetamine and metabolites    1000 Barbiturate and metabolites    200 Benzodiazepine                 200 Opiates and metabolites        300 Cocaine and metabolites        300 THC                            50 Performed at [facility], [address], [place], [place] [address]    Color, Urine 06/23/2023 YELLOW  YELLOW Final   APPearance 06/23/2023 HAZY (A)  CLEAR Final   Specific Gravity, Urine 06/23/2023 1.021  1.005 - 1.030 Final   pH 06/23/2023 5.0  5.0 - 8.0 Final   Glucose, UA 06/23/2023 NEGATIVE  NEGATIVE mg/dL Final   Hgb urine dipstick 06/23/2023 NEGATIVE  NEGATIVE Final    Bilirubin Urine 06/23/2023 NEGATIVE  NEGATIVE Final   Ketones, ur 06/23/2023 20 (A)  NEGATIVE mg/dL Final   Protein, ur 98/09/9146 30 (A)  NEGATIVE mg/dL Final   Nitrite 82/95/6213 NEGATIVE  NEGATIVE Final   Leukocytes,Ua 06/23/2023 NEGATIVE  NEGATIVE Final   RBC / HPF 06/23/2023 0-5  0 - 5 RBC/hpf Final   WBC, UA 06/23/2023 0-5  0 - 5 WBC/hpf Final   Bacteria, UA 06/23/2023 RARE (A)  NONE SEEN Final   Squamous  Epithelial / HPF 06/23/2023 0-5  0 - 5 /HPF Final   Mucus 06/23/2023 PRESENT   Final   Performed at [facility], [address] [place], [place] [address]   Sodium 06/23/2023 137  135 - 145 mmol/L Final   Potassium 06/23/2023 3.1 (L)  3.5 - 5.1 mmol/L Final   Chloride 06/23/2023 103  98 - 111 mmol/L Final   CO2 06/23/2023 23  22 - 32 mmol/L Final   Glucose, Bld 06/23/2023 124 (H)  70 - 99 mg/dL Final   Glucose reference range applies only to samples taken after fasting for at least 8 hours.   BUN 06/23/2023 10  6 - 20 mg/dL Final   Creatinine, Ser 06/23/2023 0.69  0.44 - 1.00 mg/dL Final   Calcium 95/62/1308 9.0  8.9 - 10.3 mg/dL Final   Total Protein 65/78/4696 7.9  6.5 - 8.1 g/dL Final   Albumin 29/52/8413 3.9  3.5 - 5.0 g/dL Final   AST 24/40/1027 32  15 - 41 U/L Final   ALT 06/23/2023 31  0 - 44 U/L Final   Alkaline Phosphatase 06/23/2023 53  38 - 126 U/L Final   Total Bilirubin 06/23/2023 0.6  0.3 - 1.2 mg/dL Final   GFR, Estimated 06/23/2023 >60  >60 mL/min Final   Comment: (NOTE) Calculated using the CKD-EPI Creatinine Equation (2021)    Anion gap 06/23/2023 11  5 - 15 Final   Performed at [facility], [address], [place], [place] [address]   WBC 06/23/2023 6.6  4.0 - 10.5 K/uL Final   RBC 06/23/2023 4.04  3.87 - 5.11 MIL/uL Final   Hemoglobin 06/23/2023 12.3  12.0 - 15.0 g/dL Final   HCT 44/01/4741 37.5  36.0 - 46.0 % Final   MCV 06/23/2023 92.8  80.0 - 100.0 fL Final   MCH 06/23/2023 30.4  26.0 - 34.0 pg Final   MCHC 06/23/2023 32.8  30.0 - 36.0 g/dL Final    RDW 59/56/3875 12.2  11.5 - 15.5 % Final   Platelets 06/23/2023 188  150 - 400 K/uL Final   nRBC 06/23/2023 0.0  0.0 - 0.2 % Final   Neutrophils Relative % 06/23/2023 68  % Final   Neutro Abs 06/23/2023 4.5  1.7 - 7.7 K/uL Final   Lymphocytes Relative 06/23/2023 25  % Final   Lymphs Abs 06/23/2023 1.6  0.7 - 4.0 K/uL Final   Monocytes Relative 06/23/2023 7  % Final   Monocytes Absolute 06/23/2023 0.5  0.1 - 1.0 K/uL Final   Eosinophils Relative 06/23/2023 0  % Final   Eosinophils Absolute 06/23/2023 0.0  0.0 - 0.5 K/uL Final   Basophils Relative 06/23/2023 0  % Final   Basophils Absolute 06/23/2023 0.0  0.0 - 0.1 K/uL Final   Immature Granulocytes 06/23/2023 0  % Final   Abs Immature Granulocytes 06/23/2023 0.01  0.00 - 0.07 K/uL Final   Performed at [facility], [address] [place], [place] [address]    PSYCHIATRIC REVIEW OF SYSTEMS (ROS)  ROS: Notable for the following relevant positive findings: Review of Systems  Constitutional: Negative.   HENT: Negative.    Eyes: Negative.   Respiratory: Negative.    Cardiovascular: Negative.   Gastrointestinal: Negative.   Genitourinary: Negative.   Musculoskeletal: Negative.   Skin: Negative.   Neurological: Negative.   Endo/Heme/Allergies: Negative.   Psychiatric/Behavioral:  Positive for hallucinations (Delusions and paranoia).     Additional findings:      Musculoskeletal: No abnormal movements observed  Gait & Station: Laying/Sitting      Pain Screening: Denies      Nutrition & Dental Concerns: If yes - consider referral to nutritional or dental specialist  RISK FORMULATION/ASSESSMENT  Is the patient experiencing any suicidal or homicidal ideations: No       Explain if yes:  Protective factors considered for safety management: Supportive family  Risk factors/concerns considered for safety management:  Substance abuse/dependence Aggression Barriers to accessing treatment Unwillingness to seek help  Is there a  safety management plan with the patient and treatment team to minimize risk factors and promote protective factors: Yes           Explain: Involuntary psychiatric admisson Is crisis care placement or psychiatric hospitalization recommended: Yes     Based on my current evaluation and risk assessment, patient is determined at this time to be at:  High risk  *RISK ASSESSMENT Risk assessment is a dynamic process; it is possible that this patient's condition, and risk level, may change. This should be re-evaluated and managed over time as appropriate. Please re-consult psychiatric consult services if additional assistance is needed in terms of risk assessment and management. If your team decides to discharge this patient, please advise the patient how to best access emergency psychiatric services, or to call 911, if their condition worsens or they feel unsafe in any way.   Harlene Salts, NP Telepsychiatry Consult Services

## 2023-06-23 NOTE — ED Notes (Signed)
Pt continues to pace stating that she needs to leave. Believes her children are in danger and there is a man here to shoot them. Pt hallucinations have increased here agitation. Offered her a medication to help calm her down, to which pt took voluntarily.

## 2023-06-23 NOTE — BH Assessment (Addendum)
Referral to [facility] made.  Clinician was informed by Montez Hageman that they will chat back when they have a provider and time.  All appropriate persons have been included in the secure messaging.

## 2023-06-23 NOTE — ED Notes (Addendum)
Pt has repetitive speech, stating, "repent, repent, for the kingdom of God is ahead" and is reaching for objects that are not present. Pt's voice continues to get louder and louder and her repetitive speech has now become constant. Dr. Posey Rea made aware. Order given for Ativan 1mg  PO which was successfully administered to pt after several attempts.
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
## 2023-06-23 NOTE — ED Triage Notes (Signed)
Pt bib ems due to family calling for a psych eval due to patient walking around in the street. Pt with hx schizophrenia, non compliant with medications.  118/89 HR 101 99% RA CBG 149

## 2023-06-23 NOTE — ED Notes (Signed)
Rachel Ray, [id number]

## 2023-06-23 NOTE — ED Notes (Signed)
Pt asked to provide urine sample. Pt replies, "turn those computers off. Accept jesus into your lives everyone". Will attempt to collect urine sample again shortly.

## 2023-06-24 NOTE — ED Notes (Addendum)
Patient accepted to [facility] and can arrive after 9am.   Attending Physician will be Dr. Loni Beckwith   Report can be called to: [phone] number, please leave a returned phone number to receive a phone call back

## 2023-06-24 NOTE — ED Notes (Signed)
Pt belongings given to [employer].  °

## 2023-06-24 NOTE — Progress Notes (Signed)
Pt was accepted to [facility] 06/24/2023; Bed Assignment Main Campus    Pt meets inpatient criteria per Harlene Salts, NP-Telepsychiatry Consult Services     Attending Physician will be Dr. Loni Beckwith   Report can be called to:[phone]-Pager number, please leave a returned phone number to receive a phone call back.      Pt can arrive after 9:00am   Care Team notified: Roberts Gaudy, LCSW  Maryjean Ka, MSW, [facility] 06/24/2023 2:40 AM

## 2023-06-24 NOTE — Progress Notes (Signed)
LCSW Progress Note  [id number]   Rachel Ray  06/24/2023  1:25 AM    Inpatient Behavioral Health Placement  Pt meets inpatient criteria per Harlene Salts, NP. There are no available beds within [facility] per [facility] AC Kim Brooks,RN. Referral was sent to the following facilities;   Destination  Service Provider Address Phone [facility]  [address] [place], [place] [address] [phone]  [facility]  [address] [place] [address] [phone]  [facility]  [address] [place] [address] [phone]  [facility] [address]  [address] [place] [address] [phone]  [facility]  [address] [place], [place] [address] [phone]  [facility]  [address] [place] [address] [phone]  [facility]  [address] [place], [place] [address] [phone]  [facility]  [address] [place] [address] [phone]  [facility]  [address] [place] [address] [phone]  [facility]  [address] [place] [address] [phone]  [facility]  [address], [place] [address] [phone]  [facility]  [address] [place] [address] [phone]  [facility]  [address], [place] [address] [phone]  [facility]  [address], [place] [address] [phone]  [facility]  [address] [place] [address] [phone]  [facility]  [address], [place] [address] [phone]   [facility]  [address] [place] [address] [phone]  [facility]  [address] [place] [address] [phone]  [facility]  [address] [place] [address] [phone]  [facility]  [address], [place] [address] [phone]  [facility]  [address] [place], [place] [address] [phone]  [facility]  [address], [place] [address] [phone]  [facility]  [address] [place] [address] [phone]  [facility]  [address] [place] [address] [phone]  [facility]  [address] [place] [address] [phone]  [facility]  [address], [place] [address] [phone]  [facility]  [address] [place], [place] [address] [phone]  [facility]  [address] [place] [address] [phone]  [facility]  [address] [place] [address] [phone]  [facility]  [address] [place] [address] [phone]  [facility]  [address] [place] [address] [phone]  [facility]  [address] [place] [address] [phone]    Situation ongoing,  CSW will follow up.    Maryjean Ka, MSW, LCSWA 06/24/2023 1:25 AM

## 2023-08-17 ENCOUNTER — Encounter (HOSPITAL_COMMUNITY): Payer: Self-pay | Admitting: Emergency Medicine

## 2023-08-17 ENCOUNTER — Emergency Department (HOSPITAL_COMMUNITY)
Admission: EM | Admit: 2023-08-17 | Discharge: 2023-08-18 | Disposition: A | Discharge status: Other Acute Inpatient Hospital | Payer: 59 | Source: Home / Self Care | Attending: Emergency Medicine | Admitting: Emergency Medicine

## 2023-08-17 ENCOUNTER — Other Ambulatory Visit: Payer: Self-pay

## 2023-08-17 ENCOUNTER — Encounter (HOSPITAL_COMMUNITY): Payer: Self-pay

## 2023-08-17 ENCOUNTER — Emergency Department (HOSPITAL_COMMUNITY)
Admission: EM | Admit: 2023-08-17 | Discharge: 2023-08-17 | Disposition: A | Discharge status: Home / Self Care | Payer: 59 | Source: Home / Self Care | Attending: Emergency Medicine | Admitting: Emergency Medicine

## 2023-08-17 DIAGNOSIS — Z79899 Other long term (current) drug therapy: Secondary | ICD-10-CM | POA: Insufficient documentation

## 2023-08-17 DIAGNOSIS — R440 Auditory hallucinations: Secondary | ICD-10-CM | POA: Insufficient documentation

## 2023-08-17 DIAGNOSIS — I10 Essential (primary) hypertension: Secondary | ICD-10-CM | POA: Insufficient documentation

## 2023-08-17 DIAGNOSIS — F419 Anxiety disorder, unspecified: Secondary | ICD-10-CM | POA: Insufficient documentation

## 2023-08-17 DIAGNOSIS — F250 Schizoaffective disorder, bipolar type: Secondary | ICD-10-CM | POA: Diagnosis not present

## 2023-08-17 DIAGNOSIS — R442 Other hallucinations: Secondary | ICD-10-CM | POA: Insufficient documentation

## 2023-08-17 LAB — RAPID URINE DRUG SCREEN, HOSP PERFORMED
Amphetamines: NOT DETECTED
Barbiturates: NOT DETECTED
Benzodiazepines: POSITIVE — AB
Cocaine: NOT DETECTED
Opiates: NOT DETECTED
Tetrahydrocannabinol: NOT DETECTED

## 2023-08-17 LAB — URINALYSIS, ROUTINE W REFLEX MICROSCOPIC
Bilirubin Urine: NEGATIVE
Glucose, UA: NEGATIVE mg/dL
Hgb urine dipstick: NEGATIVE
Ketones, ur: 5 mg/dL — AB
Leukocytes,Ua: NEGATIVE
Nitrite: NEGATIVE
Protein, ur: 30 mg/dL — AB
Specific Gravity, Urine: 1.025 (ref 1.005–1.030)
pH: 5 (ref 5.0–8.0)

## 2023-08-17 LAB — CBC WITH DIFFERENTIAL/PLATELET
Abs Immature Granulocytes: 0.01 10*3/uL (ref 0.00–0.07)
Basophils Absolute: 0 10*3/uL (ref 0.0–0.1)
Basophils Relative: 1 %
Eosinophils Absolute: 0.1 10*3/uL (ref 0.0–0.5)
Eosinophils Relative: 1 %
HCT: 39.6 % (ref 36.0–46.0)
Hemoglobin: 13 g/dL (ref 12.0–15.0)
Immature Granulocytes: 0 %
Lymphocytes Relative: 47 %
Lymphs Abs: 2.9 10*3/uL (ref 0.7–4.0)
MCH: 31 pg (ref 26.0–34.0)
MCHC: 32.8 g/dL (ref 30.0–36.0)
MCV: 94.5 fL (ref 80.0–100.0)
Monocytes Absolute: 0.3 10*3/uL (ref 0.1–1.0)
Monocytes Relative: 4 %
Neutro Abs: 2.8 10*3/uL (ref 1.7–7.7)
Neutrophils Relative %: 47 %
Platelets: 173 10*3/uL (ref 150–400)
RBC: 4.19 MIL/uL (ref 3.87–5.11)
RDW: 13.2 % (ref 11.5–15.5)
WBC: 6.1 10*3/uL (ref 4.0–10.5)
nRBC: 0 % (ref 0.0–0.2)

## 2023-08-17 LAB — COMPREHENSIVE METABOLIC PANEL
ALT: 18 U/L (ref 0–44)
AST: 18 U/L (ref 15–41)
Albumin: 3.8 g/dL (ref 3.5–5.0)
Alkaline Phosphatase: 63 U/L (ref 38–126)
Anion gap: 11 (ref 5–15)
BUN: 7 mg/dL (ref 6–20)
CO2: 25 mmol/L (ref 22–32)
Calcium: 9.1 mg/dL (ref 8.9–10.3)
Chloride: 104 mmol/L (ref 98–111)
Creatinine, Ser: 0.71 mg/dL (ref 0.44–1.00)
GFR, Estimated: >60 mL/min (ref >60)
Glucose, Bld: 135 mg/dL — ABNORMAL HIGH (ref 70–99)
Potassium: 3.2 mmol/L — ABNORMAL LOW (ref 3.5–5.1)
Sodium: 140 mmol/L (ref 135–145)
Total Bilirubin: 0.6 mg/dL (ref 0.3–1.2)
Total Protein: 7.5 g/dL (ref 6.5–8.1)

## 2023-08-17 LAB — ACETAMINOPHEN LEVEL: Acetaminophen (Tylenol), Serum: <10 ug/mL — ABNORMAL LOW (ref 10–30)

## 2023-08-17 LAB — ETHANOL: Alcohol, Ethyl (B): <10 mg/dL (ref <10)

## 2023-08-17 LAB — SALICYLATE LEVEL: Salicylate Lvl: <7 mg/dL — ABNORMAL LOW (ref 7.0–30.0)

## 2023-08-17 MED ORDER — ALPRAZOLAM 0.5 MG PO TABS
1.0000 mg | ORAL_TABLET | Freq: Once | ORAL | Status: AC
  Administered 2023-08-17: 1 mg via ORAL
  Filled 2023-08-17: qty 2

## 2023-08-17 MED ORDER — GABAPENTIN 100 MG PO CAPS
100.0000 mg | ORAL_CAPSULE | Freq: Three times a day (TID) | ORAL | Status: DC
  Administered 2023-08-17 – 2023-08-18 (×3): 100 mg via ORAL
  Filled 2023-08-17 (×3): qty 1

## 2023-08-17 MED ORDER — QUETIAPINE FUMARATE 100 MG PO TABS
200.0000 mg | ORAL_TABLET | Freq: Once | ORAL | Status: AC
  Administered 2023-08-17: 200 mg via ORAL
  Filled 2023-08-17: qty 2

## 2023-08-17 MED ORDER — QUETIAPINE FUMARATE 100 MG PO TABS
200.0000 mg | ORAL_TABLET | Freq: Two times a day (BID) | ORAL | Status: DC
  Administered 2023-08-17 – 2023-08-18 (×2): 200 mg via ORAL
  Filled 2023-08-17 (×2): qty 2

## 2023-08-17 MED ORDER — POTASSIUM CHLORIDE CRYS ER 20 MEQ PO TBCR
40.0000 meq | EXTENDED_RELEASE_TABLET | Freq: Once | ORAL | Status: AC
  Administered 2023-08-17: 40 meq via ORAL
  Filled 2023-08-17: qty 2

## 2023-08-17 MED ORDER — QUETIAPINE FUMARATE 100 MG PO TABS: 200.0000 mg | ORAL_TABLET | Freq: Two times a day (BID) | ORAL | 0 refills | Status: DC

## 2023-08-17 NOTE — ED Triage Notes (Signed)
Pt states she called EMS this morning and the police brought her here. Pt states she has been hearing clicking noises and things hitting the floor. States she feels like something is crawling on her. States she just hears stuff other things that other people can't hear. Pt states she hasn't taking her Seroquel or xanax since Thursday of last week. Pt states she just gets tired of taking medication and quits taking them. Denies SI.
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
## 2023-08-17 NOTE — Discharge Instructions (Addendum)
You were seen in the emergency department for auditory hallucinations.  You were offered psychiatric evaluation and you declined this.  We are restarting your Seroquel and I sent a prescription to the pharmacy.  You have refills of Xanax.  It will be important for you to get a primary care doctor and a psychiatric team to follow-up with.  List of resources provided.  There is also a behavioral health urgent care clinic in [place].

## 2023-08-17 NOTE — ED Triage Notes (Addendum)
Pt arrives POV asking to speak with psych MD. Pt states she was here earlier this morning for same and declined wanting to speak with them at that time. Pt states EDP ordered her Seroquel which she has filled and taken a dose since being discharged. Pt denies SI/HI.

## 2023-08-17 NOTE — ED Provider Notes (Signed)
[place] EMERGENCY DEPARTMENT AT [facility] Provider Note   CSN: [id number] Arrival date & time: 08/17/23  4098     History  Chief Complaint  Patient presents with   Hallucinations    Rachel Ray is a 42 y.o. female.  She has a history of mental health issues and she said she threw away her Xanax and her Seroquel about 5 days ago because she did not feel she needed it again.  For the last few days she has been hearing a clicking noise in her head it is becoming increasingly intrusive.  She said she has had this before.  She feels like she needs to restart on her psychiatric medications.  She does not have a PCP.  She denies any SI or HI.  She denies any other medical complaints.  The history is provided by the patient.  Mental Health Problem Presenting symptoms: hallucinations   Presenting symptoms: no suicidal thoughts, no suicidal threats and no suicide attempt   Degree of incapacity (severity):  Moderate Onset quality:  Gradual Timing:  Constant Progression:  Unchanged Chronicity:  Recurrent Context: noncompliance   Treatment compliance:  Untreated Relieved by:  None tried Worsened by:  Nothing Ineffective treatments:  None tried Associated symptoms: no abdominal pain, no chest pain and no headaches        Home Medications Prior to Admission medications   Medication Sig Start Date End Date Taking? Authorizing Provider  ALPRAZolam Prudy Feeler) 1 MG tablet Take by mouth. 05/26/23   [provider]  Buprenorphine HCl-Naloxone HCl 8-2 MG FILM Place under the tongue. 05/18/23   [provider]  cloNIDine (CATAPRES) 0.1 MG tablet Take by mouth daily. 05/19/23   [provider]  cloNIDine (CATAPRES) 0.2 MG tablet Take 0.2 mg by mouth 2 (two) times daily. 02/14/23   [provider]  divalproex (DEPAKOTE) 250 MG DR tablet Take 250 mg by mouth 2 (two) times daily. 05/19/23   [provider]  gabapentin (NEURONTIN) 100 MG capsule  Take 100 mg by mouth 3 (three) times daily. 03/29/23   [provider]  hydrochlorothiazide (HYDRODIURIL) 25 MG tablet Take 25 mg by mouth daily. 05/07/23   [provider]  hydrOXYzine (VISTARIL) 50 MG capsule Take 50 mg by mouth at bedtime as needed. 05/18/23   [provider]  potassium chloride (KLOR-CON) 10 MEQ tablet Take 10 mEq by mouth daily. 03/17/23   [provider]  QUEtiapine (SEROQUEL) 100 MG tablet Take 200 mg by mouth 2 (two) times daily. Patient not taking: Reported on 04/22/2023 07/01/22   [provider]      Allergies    Shellfish allergy and Penicillins    Review of Systems   Review of Systems  Constitutional:  Negative for fever.  Cardiovascular:  Negative for chest pain.  Gastrointestinal:  Negative for abdominal pain.  Neurological:  Negative for headaches.  Psychiatric/Behavioral:  Positive for hallucinations. Negative for suicidal ideas.     Physical Exam Updated Vital Signs BP 113/86 (BP Location: Left Arm)   Pulse (!) 57   Temp 97.9 F (36.6 C) (Oral)   Resp 19   Ht 5\' 2"  (1.575 m)   Wt 121.1 kg   LMP 03/13/2016 (Exact Date)   SpO2 97%   BMI 48.83 kg/m  Physical Exam Vitals and nursing note reviewed.  Constitutional:      General: She is not in acute distress.    Appearance: Normal appearance. She is well-developed.  HENT:  Head: Normocephalic and atraumatic.  Eyes:     Conjunctiva/sclera: Conjunctivae normal.  Cardiovascular:     Rate and Rhythm: Normal rate and regular rhythm.     Heart sounds: No murmur heard. Pulmonary:     Effort: Pulmonary effort is normal. No respiratory distress.     Breath sounds: Normal breath sounds.  Abdominal:     Palpations: Abdomen is soft.     Tenderness: There is no abdominal tenderness.  Musculoskeletal:        General: No swelling.     Cervical back: Neck supple.  Skin:    General: Skin is warm and dry.     Capillary Refill: Capillary refill takes less than 2  seconds.  Neurological:     General: No focal deficit present.     Mental Status: She is alert.  Psychiatric:        Thought Content: Thought content does not include homicidal or suicidal ideation.     ED Results / Procedures / Treatments   Labs (all labs ordered are listed, but only abnormal results are displayed) Labs Reviewed - No data to display  EKG None  Radiology No results found.  Procedures Procedures    Medications Ordered in ED Medications - No data to display  ED Course/ Medical Decision Making/ A&P                                 Medical Decision Making Risk Prescription drug management.   This patient complains of auditory hallucinations and being off her medication; this involves an extensive number of treatment Options and is a complaint that carries with it a high risk of complications and morbidity. The differential includes mental health emergency, medication compliance, medication side effect Previous records obtained and reviewed in epic including recent behavioral health notes from the end of July. Social determinants considered, no significant barriers Critical Interventions: None  After the interventions stated above, I reevaluated the patient and found well-appearing in no distress Admission and further testing considered, she was offered TTS evaluation which she declines.  She is voluntary at this point and has no suicidal or homicidal ideations no indications for IVC placement.  Will refill her prescription for Seroquel.  It looks like she already has refills for her Xanax.  Given contact information for behavioral health urgent care and resources for other outpatient psychiatric follow-up.  Return instructions discussed         Final Clinical Impression(s) / ED Diagnoses Final diagnoses:  Auditory hallucination    Rx / DC Orders ED Discharge Orders          Ordered    QUEtiapine (SEROQUEL) 100 MG tablet  2 times daily         08/17/23 0837              Terrilee Files, MD 08/17/23 [id number]

## 2023-08-18 ENCOUNTER — Other Ambulatory Visit: Payer: Self-pay

## 2023-08-18 ENCOUNTER — Emergency Department (HOSPITAL_COMMUNITY): Payer: 59

## 2023-08-18 ENCOUNTER — Inpatient Hospital Stay (HOSPITAL_COMMUNITY)
Admission: AD | Admit: 2023-08-18 | Discharge: 2023-08-23 | DRG: 885 | Disposition: A | Discharge status: Home / Self Care | Payer: 59 | Source: Intra-hospital | Attending: Psychiatry | Admitting: Psychiatry

## 2023-08-18 ENCOUNTER — Encounter (HOSPITAL_COMMUNITY): Payer: Self-pay | Admitting: Psychiatry

## 2023-08-18 DIAGNOSIS — Z9152 Personal history of nonsuicidal self-harm: Secondary | ICD-10-CM

## 2023-08-18 DIAGNOSIS — Z79899 Other long term (current) drug therapy: Secondary | ICD-10-CM | POA: Diagnosis not present

## 2023-08-18 DIAGNOSIS — R41843 Psychomotor deficit: Secondary | ICD-10-CM | POA: Diagnosis present

## 2023-08-18 DIAGNOSIS — Z5941 Food insecurity: Secondary | ICD-10-CM | POA: Diagnosis not present

## 2023-08-18 DIAGNOSIS — R7303 Prediabetes: Secondary | ICD-10-CM | POA: Diagnosis present

## 2023-08-18 DIAGNOSIS — F419 Anxiety disorder, unspecified: Secondary | ICD-10-CM | POA: Diagnosis present

## 2023-08-18 DIAGNOSIS — Z91013 Allergy to seafood: Secondary | ICD-10-CM | POA: Diagnosis not present

## 2023-08-18 DIAGNOSIS — Z818 Family history of other mental and behavioral disorders: Secondary | ICD-10-CM | POA: Diagnosis not present

## 2023-08-18 DIAGNOSIS — Z87891 Personal history of nicotine dependence: Secondary | ICD-10-CM | POA: Diagnosis not present

## 2023-08-18 DIAGNOSIS — T43596A Underdosing of other antipsychotics and neuroleptics, initial encounter: Secondary | ICD-10-CM | POA: Diagnosis present

## 2023-08-18 DIAGNOSIS — I10 Essential (primary) hypertension: Secondary | ICD-10-CM | POA: Diagnosis present

## 2023-08-18 DIAGNOSIS — Z9151 Personal history of suicidal behavior: Secondary | ICD-10-CM | POA: Diagnosis not present

## 2023-08-18 DIAGNOSIS — Z880 Allergy status to penicillin: Secondary | ICD-10-CM | POA: Diagnosis not present

## 2023-08-18 DIAGNOSIS — Z62810 Personal history of physical and sexual abuse in childhood: Secondary | ICD-10-CM

## 2023-08-18 DIAGNOSIS — F201 Disorganized schizophrenia: Secondary | ICD-10-CM | POA: Diagnosis not present

## 2023-08-18 DIAGNOSIS — G8929 Other chronic pain: Secondary | ICD-10-CM | POA: Diagnosis present

## 2023-08-18 DIAGNOSIS — F4310 Post-traumatic stress disorder, unspecified: Secondary | ICD-10-CM | POA: Diagnosis present

## 2023-08-18 DIAGNOSIS — F209 Schizophrenia, unspecified: Principal | ICD-10-CM | POA: Diagnosis present

## 2023-08-18 DIAGNOSIS — F250 Schizoaffective disorder, bipolar type: Principal | ICD-10-CM | POA: Insufficient documentation

## 2023-08-18 DIAGNOSIS — Z635 Disruption of family by separation and divorce: Secondary | ICD-10-CM

## 2023-08-18 DIAGNOSIS — Z91128 Patient's intentional underdosing of medication regimen for other reason: Secondary | ICD-10-CM | POA: Diagnosis not present

## 2023-08-18 LAB — PREGNANCY, URINE: Preg Test, Ur: NEGATIVE

## 2023-08-18 MED ORDER — DIPHENHYDRAMINE HCL 50 MG/ML IJ SOLN: 50.0000 mg | Freq: Three times a day (TID) | INTRAMUSCULAR | Status: DC | PRN

## 2023-08-18 MED ORDER — DIPHENHYDRAMINE HCL 25 MG PO CAPS
50.0000 mg | ORAL_CAPSULE | Freq: Three times a day (TID) | ORAL | Status: DC | PRN
  Filled 2023-08-18: qty 2

## 2023-08-18 MED ORDER — HALOPERIDOL LACTATE 5 MG/ML IJ SOLN: 5.0000 mg | Freq: Three times a day (TID) | INTRAMUSCULAR | Status: DC | PRN

## 2023-08-18 MED ORDER — ACETAMINOPHEN 325 MG PO TABS: 650.0000 mg | ORAL_TABLET | Freq: Four times a day (QID) | ORAL | Status: DC | PRN

## 2023-08-18 MED ORDER — ALUM & MAG HYDROXIDE-SIMETH 200-200-20 MG/5ML PO SUSP: 30.0000 mL | ORAL | Status: DC | PRN

## 2023-08-18 MED ORDER — LORAZEPAM 2 MG/ML IJ SOLN: 2.0000 mg | Freq: Three times a day (TID) | INTRAMUSCULAR | Status: DC | PRN

## 2023-08-18 MED ORDER — MAGNESIUM HYDROXIDE 400 MG/5ML PO SUSP: 30.0000 mL | Freq: Every day | ORAL | Status: DC | PRN

## 2023-08-18 MED ORDER — ALPRAZOLAM 0.5 MG PO TABS
1.0000 mg | ORAL_TABLET | Freq: Once | ORAL | Status: AC
  Administered 2023-08-18: 1 mg via ORAL
  Filled 2023-08-18: qty 2

## 2023-08-18 MED ORDER — HALOPERIDOL 5 MG PO TABS
5.0000 mg | ORAL_TABLET | Freq: Three times a day (TID) | ORAL | Status: DC | PRN
  Filled 2023-08-18: qty 1

## 2023-08-18 MED ORDER — QUETIAPINE FUMARATE 200 MG PO TABS
200.0000 mg | ORAL_TABLET | Freq: Two times a day (BID) | ORAL | Status: DC
  Administered 2023-08-18 – 2023-08-20 (×4): 200 mg via ORAL
  Filled 2023-08-18 (×7): qty 1

## 2023-08-18 MED ORDER — ACETAMINOPHEN 325 MG PO TABS
650.0000 mg | ORAL_TABLET | Freq: Four times a day (QID) | ORAL | Status: DC | PRN
  Administered 2023-08-18 (×2): 650 mg via ORAL
  Filled 2023-08-18 (×2): qty 2

## 2023-08-18 MED ORDER — LORAZEPAM 1 MG PO TABS
2.0000 mg | ORAL_TABLET | Freq: Three times a day (TID) | ORAL | Status: DC | PRN
  Filled 2023-08-18: qty 2

## 2023-08-18 MED ORDER — GABAPENTIN 100 MG PO CAPS
100.0000 mg | ORAL_CAPSULE | Freq: Three times a day (TID) | ORAL | Status: DC
  Administered 2023-08-19 – 2023-08-23 (×14): 100 mg via ORAL
  Filled 2023-08-18 (×19): qty 1

## 2023-08-18 MED ORDER — ACETAMINOPHEN 325 MG PO TABS
650.0000 mg | ORAL_TABLET | Freq: Four times a day (QID) | ORAL | Status: DC | PRN
  Administered 2023-08-20 – 2023-08-22 (×3): 650 mg via ORAL
  Filled 2023-08-18 (×4): qty 2

## 2023-08-18 NOTE — ED Notes (Signed)
Patient assessed by TTS.
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
## 2023-08-18 NOTE — ED Notes (Signed)
Unable to give report on this patient
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
## 2023-08-18 NOTE — ED Notes (Signed)
Attempt to call department to give update that patient was on the way. Called x2. No answer.

## 2023-08-18 NOTE — Progress Notes (Signed)
Pt has been accepted to [facility] TODAY 08/18/2023, pending CT, Upreg, and voluntary consent. Bed assignment: 508-1  Pt meets inpatient criteria per Hillery Jacks, NP  Attending Physician will be Phineas Inches, MD  Report can be called to: - Adult unit: [phone]  Pt can arrive after pending items are received  Care Team Notified: [facility] Rona Ravens, RN, Hillery Jacks, NP, Fransico Him, RN, Florentina Addison, RN, and Orson Slick, RN  Hiilani Jetter, LCSW  08/18/2023 2:05 PM

## 2023-08-18 NOTE — ED Provider Notes (Signed)
Spoke with [facility], they are requesting urine preg and CTH prior to receiving patient.   Accepted by Phineas Inches, MD   Will place orders     Sloan Leiter, DO 08/18/23 1416

## 2023-08-18 NOTE — ED Notes (Signed)
Telesitter camera set-up and confirmed visual with telesitter

## 2023-08-18 NOTE — ED Notes (Signed)
Pt alert in bed stating she feels like something is "crawling all over" her. She states she was given xanax yesterday and it helped. EDP notified.

## 2023-08-18 NOTE — ED Notes (Signed)
Pt depart with safe transport at this time.

## 2023-08-18 NOTE — Plan of Care (Signed)
Problem: Education: Goal: Knowledge of [place] [facility] Education information/materials will improve Outcome: Progressing   Problem: Education: Goal: Emotional status will improve Outcome: Not Progressing Goal: Mental status will improve Outcome: Not Progressing

## 2023-08-18 NOTE — ED Notes (Addendum)
Per Brenton Grills : "[name], NP recommends inpatient hospitalization "

## 2023-08-19 ENCOUNTER — Encounter (HOSPITAL_COMMUNITY): Payer: Self-pay | Admitting: Psychiatry

## 2023-08-19 DIAGNOSIS — F201 Disorganized schizophrenia: Secondary | ICD-10-CM | POA: Diagnosis not present

## 2023-08-19 MED ORDER — QUETIAPINE FUMARATE 50 MG PO TABS
50.0000 mg | ORAL_TABLET | Freq: Three times a day (TID) | ORAL | Status: DC | PRN
  Administered 2023-08-19: 50 mg via ORAL
  Filled 2023-08-19 (×2): qty 1

## 2023-08-19 MED ORDER — POTASSIUM CHLORIDE ER 10 MEQ PO TBCR
10.0000 meq | EXTENDED_RELEASE_TABLET | Freq: Every day | ORAL | Status: DC
  Administered 2023-08-19 – 2023-08-23 (×5): 10 meq via ORAL
  Filled 2023-08-19 (×8): qty 1

## 2023-08-19 MED ORDER — METFORMIN HCL ER 500 MG PO TB24
500.0000 mg | ORAL_TABLET | Freq: Every day | ORAL | Status: DC
  Administered 2023-08-22 – 2023-08-23 (×2): 500 mg via ORAL
  Filled 2023-08-19 (×6): qty 1

## 2023-08-19 MED ORDER — MELATONIN 3 MG PO TABS
3.0000 mg | ORAL_TABLET | Freq: Every evening | ORAL | Status: DC | PRN
  Administered 2023-08-19 – 2023-08-22 (×4): 3 mg via ORAL
  Filled 2023-08-19 (×4): qty 1

## 2023-08-19 MED ORDER — HYDROCHLOROTHIAZIDE 25 MG PO TABS
25.0000 mg | ORAL_TABLET | Freq: Every day | ORAL | Status: DC
  Administered 2023-08-19 – 2023-08-23 (×5): 25 mg via ORAL
  Filled 2023-08-19 (×8): qty 1

## 2023-08-19 MED ORDER — LORAZEPAM 1 MG PO TABS: 1.0000 mg | ORAL_TABLET | Freq: Four times a day (QID) | ORAL | Status: AC | PRN

## 2023-08-19 MED ORDER — ESCITALOPRAM OXALATE 5 MG PO TABS
5.0000 mg | ORAL_TABLET | Freq: Every day | ORAL | Status: DC
  Administered 2023-08-19 – 2023-08-20 (×2): 5 mg via ORAL
  Filled 2023-08-19 (×4): qty 1

## 2023-08-19 MED ORDER — HYDROXYZINE HCL 25 MG PO TABS
25.0000 mg | ORAL_TABLET | Freq: Three times a day (TID) | ORAL | Status: DC | PRN
  Administered 2023-08-19 – 2023-08-22 (×3): 25 mg via ORAL
  Filled 2023-08-19 (×3): qty 1

## 2023-08-19 NOTE — Progress Notes (Signed)
Recreation Therapy Notes  INPATIENT RECREATION THERAPY ASSESSMENT  Patient Details Name: Rachel Ray MRN: [id number] DOB: 12-Jan-1981 Today's Date: 08/19/2023       Information Obtained From: Patient  Able to Participate in Assessment/Interview: Yes  Patient Presentation: Alert  Reason for Admission (Per Patient): Med Non-Compliance, Other (Comments) (Pt also stated she started hearing things and was brought to the hospital and restarted on her medicines.)  Patient Stressors: Other (Comment) ("not taking medicine")  Coping Skills:   Isolation, Journal, TV, Music, Exercise, Talk, Prayer, Avoidance, Read, Dance, Hot Bath/Shower  Leisure Interests (2+):  Music - Play instrument, Music - Singing, Individual - Other (Comment) (do hair and make up)  Frequency of Recreation/Participation: Other (Comment) ("I've been slacking lately. It feels like I'm going through something spiritual".)  Awareness of Community Resources:  Yes  Community Resources:  [employer], Engineering geologist  Current Use: Yes  If no, Barriers?:    Expressed Interest in [employer] Information: No  [employer] of Residence:  [place]  Patient Main Form of Transportation: Therapist, music  Patient Strengths:  Singing, Encouraging others/helping others, listen to their problems  Patient Identified Areas of Improvement:  being in crowds/around new people; not thinking people don't like her  Patient Goal for Hospitalization:  "get medicine where it can help me but don't want to be under sedation, be able to sleep at night. I feel the therapy is helping and being around people who need help like me"  Current SI (including self-harm):  No  Current HI:  No  Current AVH: Yes (Hears something hitting the floor)  Staff Intervention Plan: Group Attendance, Collaborate with Interdisciplinary Treatment Team  Consent to Intern Participation: N/A   Alyson Ki-McCall, LRT,CTRS Demaris Leavell A  Paddy Walthall-McCall 08/19/2023, 2:35 PM

## 2023-08-19 NOTE — Plan of Care (Signed)
Problem: Activity: Goal: Interest or engagement in activities will improve Outcome: Progressing   Problem: Health Behavior/Discharge Planning: Goal: Compliance with treatment plan for underlying cause of condition will improve Outcome: Progressing   Pt visible in dayroom at long intervals this shift for scheduled groups. A & O to self, place, day and situation. Observed with congruent affect, brightens up on interactions, logical speech, fair eye contact and is ambulatory in milieu with steady gait. Denies SI, HI and VH. Continues to endorse +AH when assessed "I still hear like tapping sounds as if something is falling from the ceiling. I can still feel stuff crawling on my skin too". Reports poor sleep last night related to racing thought and "I did not have my Xenax too. I've been on it for a while". Reports good appetite. Rates her anxiety and depression both 9/10, started on Seroquel 200 mg BID and Seroquel 50 mg PRN as well for worsening anxiety as the shift progressed. Received PRN Tylenol for generalized aches / discomfort and CIWA protocol implemented as ordered for benzo withdrawals. Safety checks maintained at Q 15 minutes intervals without outburst. Pt tolerated meals, fluids and medications well. Off unit for recreation activities, returned without issues.

## 2023-08-19 NOTE — Progress Notes (Signed)
Pt up complaining of hearing voices again, staff talked to pt 1:1 to help her, pt up sitting on the bench in her room visibly disturbed by the voices, pt encouraged to come to staff if the voices get any worse. Pt appeared a little calmer after talking with staff

## 2023-08-19 NOTE — [facility] Group Notes (Signed)
[facility] Group Notes:  (Nursing/MHT/Case Management/Adjunct)  Date:  08/19/2023  Time:  9:00 PM  Type of Therapy:  Psychoeducational Skills  Participation Level:  Active  Participation Quality:  Appropriate  Affect:  Appropriate  Cognitive:  Appropriate  Insight:  Improving  Engagement in Group:  Developing/Improving  Modes of Intervention:  Education  Summary of Progress/Problems: The patient rated her day as a 7 out of 10 since she felt that she isn't receiving the correct medication. Her goal for today was to be around her peers which she accomplished. Her goal for tomorrow is to work on her anxiety.   Rachel Ray S 08/19/2023, 9:00 PM

## 2023-08-19 NOTE — Plan of Care (Signed)
Problem: Education: Goal: Emotional status will improve Outcome: Progressing Goal: Mental status will improve Outcome: Progressing   Problem: Activity: Goal: Interest or engagement in activities will improve Outcome: Progressing Goal: Sleeping patterns will improve Outcome: Progressing   Problem: Self-Concept: Goal: Level of anxiety will decrease Outcome: Progressing

## 2023-08-19 NOTE — Progress Notes (Signed)
Pt stated she was hearing the clicking sounds again, pt given PRN Seroquel per MAR. Pt requesting something for sleep and anxiety, NP ordered Melatonin and Hydroxyzine and was given per [facility] .      08/19/23 2230  Psych Admission Type (Psych Patients Only)  Admission Status Voluntary  Psychosocial Assessment  Patient Complaints Anxiety;Worrying  Eye Contact Fair  Facial Expression Sad  Affect Appropriate to circumstance  Speech Soft  Interaction Assertive  Motor Activity Slow  Appearance/Hygiene In scrubs  Behavior Characteristics Cooperative  Mood Anxious;Depressed;Preoccupied  Aggressive Behavior  Effect No apparent injury  Thought Process  Coherency Circumstantial  Content Blaming self  Delusions None reported or observed  Perception Hallucinations  Hallucination Auditory  Judgment WDL  Confusion WDL  Danger to Self  Current suicidal ideation? Denies
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
## 2023-08-19 NOTE — Progress Notes (Addendum)
Pt did not want to discuss the sounds with this writer, pt only talked to the MHT, writer told MHT to tell pt to talk to the doctor tomorrow
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
## 2023-08-19 NOTE — Group Note (Signed)
Recreation Therapy Group Note   Group Topic:Other  Group Date: 08/19/2023 Start Time: 1030 End Time: 1100 Facilitators: Asenath Balash-McCall, LRT,CTRS Location: [address] Dayroom   Goal Area(s) Addresses:  Patient will effectively work in a team with other group members. Patient will verbalize importance of using appropriate problem solving techniques.  Patient will identify positive change associated with effective problem solving skills.   Group Description: Brain Teasers. Patients were grouped together and given two sheets of brain teasers. Patients were given 15 minutes to work together to solve the puzzles presented on the worksheets. LRT went over answers to the puzzles with patients when their time was up.    Education Outcome: Acknowledges understanding/In group clarification offered/Needs additional education.    Affect/Mood: Appropriate   Participation Level: Engaged   Participation Quality: Independent   Behavior: Appropriate   Speech/Thought Process: Focused   Insight: Good   Judgement: Good   Modes of Intervention: Worksheet   Patient Response to Interventions:  Engaged   Education Outcome:  In group clarification offered    Clinical Observations/Individualized Feedback: Pt was bright and engaged. Pt worked well with peer. Pt was focused and appeared to be focused on completing the assignment. Pt was also attentive while going over the answers to the puzzles.    Plan: Continue to engage patient in RT group sessions 2-3x/week.   Rachel Ray, LRT,CTRS 08/19/2023 12:30 PM

## 2023-08-19 NOTE — [facility] Suicide Risk Assessment (Signed)
[facility] INPATIENT:  Family/Significant Other Suicide Prevention Education  Suicide Prevention Education:  Education Completed; Rachel Ray ( mom ) [phone],  (name of family member/significant other) has been identified by the patient as the family member/significant other with whom the patient will be residing, and identified as the person(s) who will aid the patient in the event of a mental health crisis (suicidal ideations/suicide attempt).  With written consent from the patient, the family member/significant other has been provided the following suicide prevention education, prior to the and/or following the discharge of the patient.   Mom shared that patient started back hearing voices again and mentioned that the only medication her daughter was on was Xanax and Seroquel, but that patient had called her saying that the doctor would not start her Xanax. Mom has no concerns at this time and denied patient having any access to medications, guns, or weapons.   The suicide prevention education provided includes the following: Suicide risk factors Suicide prevention and interventions National Suicide Hotline telephone number [facility] assessment telephone number [facility] Emergency Assistance 911 [facility] and/or Residential Mobile Crisis Unit telephone number  Request made of family/significant other to: Remove weapons (e.g., guns, rifles, knives), all items previously/currently identified as safety concern.   Remove drugs/medications (over-the-counter, prescriptions, illicit drugs), all items previously/currently identified as a safety concern.  The family member/significant other verbalizes understanding of the suicide prevention education information provided.  The family member/significant other agrees to remove the items of safety concern listed above.  Rachel Ray 08/19/2023, 2:53 PM

## 2023-08-19 NOTE — Progress Notes (Signed)
Pt appears to continue to be struggling with the voices, pt appearing to get frustrated , staff continues to give pt encouragement and support

## 2023-08-19 NOTE — Progress Notes (Signed)
Pt stated she was hearing the clicking sound again, NP notified to try to get 1x antipsychotic to help with the [facility]

## 2023-08-20 ENCOUNTER — Encounter (HOSPITAL_COMMUNITY): Payer: Self-pay

## 2023-08-20 DIAGNOSIS — F201 Disorganized schizophrenia: Secondary | ICD-10-CM | POA: Diagnosis not present

## 2023-08-20 DIAGNOSIS — F250 Schizoaffective disorder, bipolar type: Secondary | ICD-10-CM | POA: Insufficient documentation

## 2023-08-20 LAB — COMPREHENSIVE METABOLIC PANEL
ALT: 18 U/L (ref 0–44)
AST: 17 U/L (ref 15–41)
Albumin: 4 g/dL (ref 3.5–5.0)
Alkaline Phosphatase: 68 U/L (ref 38–126)
Anion gap: 8 (ref 5–15)
BUN: 10 mg/dL (ref 6–20)
CO2: 26 mmol/L (ref 22–32)
Calcium: 9.2 mg/dL (ref 8.9–10.3)
Chloride: 104 mmol/L (ref 98–111)
Creatinine, Ser: 0.72 mg/dL (ref 0.44–1.00)
GFR, Estimated: >60 mL/min (ref >60)
Glucose, Bld: 128 mg/dL — ABNORMAL HIGH (ref 70–99)
Potassium: 3.4 mmol/L — ABNORMAL LOW (ref 3.5–5.1)
Sodium: 138 mmol/L (ref 135–145)
Total Bilirubin: 0.5 mg/dL (ref 0.3–1.2)
Total Protein: 8.1 g/dL (ref 6.5–8.1)

## 2023-08-20 LAB — HEMOGLOBIN A1C
Hgb A1c MFr Bld: 5.9 % — ABNORMAL HIGH (ref 4.8–5.6)
Mean Plasma Glucose: 122.63 mg/dL

## 2023-08-20 LAB — LIPID PANEL
Cholesterol: 202 mg/dL — ABNORMAL HIGH (ref 0–200)
HDL: 57 mg/dL (ref >40)
LDL Cholesterol: 130 mg/dL — ABNORMAL HIGH (ref 0–99)
Total CHOL/HDL Ratio: 3.5 {ratio}
Triglycerides: 76 mg/dL (ref <150)
VLDL: 15 mg/dL (ref 0–40)

## 2023-08-20 LAB — TSH: TSH: 0.698 u[IU]/mL (ref 0.350–4.500)

## 2023-08-20 MED ORDER — CLONIDINE HCL 0.2 MG PO TABS
0.2000 mg | ORAL_TABLET | Freq: Two times a day (BID) | ORAL | Status: DC
  Administered 2023-08-20 – 2023-08-23 (×5): 0.2 mg via ORAL
  Filled 2023-08-20 (×2): qty 1
  Filled 2023-08-20: qty 2
  Filled 2023-08-20 (×7): qty 1
  Filled 2023-08-20: qty 2
  Filled 2023-08-20: qty 1

## 2023-08-20 MED ORDER — QUETIAPINE FUMARATE 400 MG PO TABS
400.0000 mg | ORAL_TABLET | Freq: Every day | ORAL | Status: DC
  Administered 2023-08-20 – 2023-08-22 (×3): 400 mg via ORAL
  Filled 2023-08-20 (×6): qty 1

## 2023-08-20 MED ORDER — QUETIAPINE FUMARATE 200 MG PO TABS
200.0000 mg | ORAL_TABLET | Freq: Every day | ORAL | Status: DC
  Administered 2023-08-21: 200 mg via ORAL
  Filled 2023-08-20 (×4): qty 1

## 2023-08-20 NOTE — Group Note (Signed)
Date:  08/20/2023 Time:  8:44 PM  Group Topic/Focus:  Wrap-Up Group:   The focus of this group is to help patients review their daily goal of treatment and discuss progress on daily workbooks.    Participation Level:  Active  Participation Quality:  Appropriate  Affect:  Appropriate  Cognitive:  Appropriate  Insight: Appropriate  Engagement in Group:  Engaged  Modes of Intervention:  Education and Exploration  Additional Comments:  Patient attended and participated in group tonight. She reports that today she enjoyed going to the GYM with the group and played basket ball which helped her to relieve stress.  Lita [facility] 08/20/2023, 8:44 PM

## 2023-08-20 NOTE — Plan of Care (Signed)
  Problem: Education: Goal: Emotional status will improve Outcome: Progressing   Problem: Activity: Goal: Interest or engagement in activities will improve Outcome: Progressing   Problem: Safety: Goal: Periods of time without injury will increase Outcome: Progressing

## 2023-08-20 NOTE — Progress Notes (Signed)
Rachel Ray requested to speak with chaplain.  She stated that she is experiencing spiritual warfare and that she is exhausted and has not slept in 2 days.  She requested prayer, which chaplain provided as well as emotional support.

## 2023-08-20 NOTE — [facility] Group Notes (Signed)
Spirituality group facilitated by Kathleen Argue, BCC.  Group Description: Group focused on topic of hope. Patients participated in facilitated discussion around topic, connecting with one another around experiences and definitions for hope. Group members engaged with visual explorer photos, reflecting on what hope looks like for them today. Group engaged in discussion around how their definitions of hope are present today in hospital.  Modalities: Psycho-social ed, Adlerian, Narrative, MI  Patient Progress: Rachel Ray attended group and actively engaged and participated in group conversation and activities.  Her comments were on topic and appropriate and contributed positively to the group.

## 2023-08-20 NOTE — Progress Notes (Signed)
Patient was concerned about her bp medications being ordered and c/o headache and bp was elevated. Order was received for her clonidine to be restarted.   08/20/23 2105  Psych Admission Type (Psych Patients Only)  Admission Status Voluntary  Psychosocial Assessment  Patient Complaints Insomnia;Depression;Worrying  Eye Contact Fair  Facial Expression Flat;Sad  Affect Appropriate to circumstance  Speech Logical/coherent  Interaction Assertive  Motor Activity Slow  Appearance/Hygiene Unremarkable  Behavior Characteristics Cooperative;Appropriate to situation  Mood Depressed;Pleasant  Aggressive Behavior  Effect No apparent injury  Thought Process  Coherency Circumstantial  Content Blaming self  Delusions None reported or observed  Perception Hallucinations  Hallucination Auditory  Judgment WDL  Confusion None  Danger to Self  Current suicidal ideation? Denies  Danger to Others  Danger to Others None reported or observed

## 2023-08-20 NOTE — Group Note (Signed)
Recreation Therapy Group Note   Group Topic:Problem Solving  Group Date: 08/20/2023 Start Time: 1040 End Time: 1105 Facilitators: Alexius Ellington-McCall, LRT,CTRS Location: [address] Dayroom   Group Topic: Communication, Team Building, Problem Solving  Goal Area(s) Addresses:  Patient will effectively work with peer towards shared goal.  Patient will identify skills used to make activity successful.  Patient will share challenges and verbalize solution-driven approaches used. Patient will identify how skills used during activity can be used to reach post d/c goals.   Intervention: STEM Activity   Group Description: [employer]. Patients were provided the following materials: 5 drinking straws, 5 rubber bands, 5 paper clips, 2 index cards, 2 drinking cups, and 2 toilet paper rolls. Using the provided materials patients were asked to build a launching mechanism to launch a ping pong ball across the room, approximately 10 feet. Patients were divided into teams of 3-5. Instructions required all materials be incorporated into the device, functionality of items left to the peer group's discretion.  Education: Pharmacist, community, Scientist, physiological, Air cabin crew, Building control surveyor.   Education Outcome: Acknowledges education/In group clarification offered/Needs additional education.   Affect/Mood: Appropriate   Participation Level: Engaged   Participation Quality: Independent   Behavior: Appropriate   Speech/Thought Process: Focused   Insight: Good   Judgement: Good   Modes of Intervention: STEM Activity   Patient Response to Interventions:  Engaged   Education Outcome:  In group clarification offered    Clinical Observations/Individualized Feedback: Pt arrived a little late to group session. Pt worked and focused to catch on to the activity. Pt was social and engaged with peer to come up with a structure that would shoot the ping pong ball. Pt was appropriate throughout  session.     Plan: Continue to engage patient in RT group sessions 2-3x/week.   Kowen Kluth-McCall, LRT,CTRS 08/20/2023 1:04 PM

## 2023-08-20 NOTE — Progress Notes (Signed)
Pt awake in bed, tearful on initial interactions. Denies SI, HI, VH and pain. Noted with crying spells, paranoid "I'm being spiritually attack. I need help. I can't sleep, they are after me". Took her scheduled medications, attended groups and spoke with Chaplain after groups. Received Tylenol for complain of headach 6/10 with minimal relief. Per pt "I feel better and the doctor told me he will work with me. I just need to start sleeping again too". Continues to endorse + AH "I still hear tapping sounds from the ceiling but I will be fine". Safety checks maintained at Q 15 minutes intervals without issues. Off unit for meals and to gym for recreation time, returned without issues.

## 2023-08-20 NOTE — Progress Notes (Addendum)
[facility] MD Progress Note  08/20/2023 3:01 PM Rachel Ray  MRN:  [id number]  Subjective:    Rachel Ray is a 42 yr old female who presented on 9/25 to [facility] with complaints of Auditory and Tactile Hallucinations due to stopping her medications, she was admitted to [facility] on 9/26. PPHx is significant for Schizophrenia, Bipolar Disorder, Depression, Anxiety, PTSD, and a history of Opioid Abuse, and 4 Suicide Attempts (last-cut wrists 2017), Remote history of Self Injurious Behavior (Cutting-last early 20's), and ~10 Prior Psychiatric Hospitalizations (last- [facility] 06/2023).    During the interview today the pt reports that AH continue, and she is getting spiritual attacks from the underworld. She reports mood is up and down (nurses stat eht patient has labile mood, crying, laughing, irritable). Pt reports poor sleep, 2.75 hours documented. Reports appetite is okay. Denies si or hi today. Reports high level of anxiety and was educated that she has both hydroxyzine and seroquel PRN for anxiety. Pt requested restarting xanax or ativan.  We discussed increasing seroquel for sleep and pt is agreeable.   Pt signed a 72 hr req for dc today, and later told me she would rescind this   Principal Problem: Schizophrenia (HCC) Diagnosis: Principal Problem:   Schizophrenia (HCC) Active Problems:   Schizoaffective disorder, bipolar type (HCC)  Total Time spent with patient: 20 minutes  Past Psychiatric History:  Schizophrenia, Bipolar Disorder, Depression, Anxiety, PTSD, and a history of Opioid Abuse, and 4 Suicide Attempts (last-cut wrists 2017), Remote history of Self Injurious Behavior (Cutting-last early 20's), and ~10 Prior Psychiatric Hospitalizations (last- [facility] 06/2023).  Seroquel, Haldol, Depakote, Suboxone, Gabapentin, Zoloft, Lexapro    Past Medical History:  Past Medical History:  Diagnosis Date   Anemia    Anxiety    Arthritis    rheumatoid   Chronic pelvic pain in female     Depression    Dysmenorrhea    Hypertension    Menorrhagia    Uterine fibroid     Past Surgical History:  Procedure Laterality Date   ABDOMINAL HYSTERECTOMY N/A 03/25/2016   Procedure: HYSTERECTOMY ABDOMINAL;  Surgeon: Lazaro Arms, MD;  Location: [facility];  Service: Gynecology;  Laterality: N/A;   ADENOIDECTOMY     CESAREAN SECTION     x 3   CHOLECYSTECTOMY     SALPINGOOPHORECTOMY Bilateral 03/25/2016   Procedure: SALPINGO OOPHORECTOMY;  Surgeon: Lazaro Arms, MD;  Location: [facility];  Service: Gynecology;  Laterality: Bilateral;   TONSILLECTOMY     TUBAL LIGATION     Family History:  Family History  Problem Relation Age of Onset   Diabetes Maternal Grandmother    Diabetes Maternal Grandfather    Cancer Other        great grandmother-breast cancer   Cancer Maternal Aunt        breast cancer   Family Psychiatric  History: See H&P   Social History:  Social History   Substance and Sexual Activity  Alcohol Use No   Alcohol/week: 0.0 standard drinks of alcohol     Social History   Substance and Sexual Activity  Drug Use No    Social History   Socioeconomic History   Marital status: Legally Separated    Spouse name: Not on file   Number of children: Not on file   Years of education: Not on file   Highest education level: Not on file  Occupational History   Not on file  Tobacco Use   Smoking status:  Former    Current packs/day: 0.00    Average packs/day: 0.5 packs/day for 15.0 years (7.5 ttl pk-yrs)    Types: Cigarettes    Start date: 01/21/2005    Quit date: 01/22/2020    Years since quitting: 3.5   Smokeless tobacco: Never  Substance and Sexual Activity   Alcohol use: No    Alcohol/week: 0.0 standard drinks of alcohol   Drug use: No   Sexual activity: Yes    Birth control/protection: Surgical  Other Topics Concern   Not on file  Social History Narrative   Not on file   Social Determinants of Health   Financial Resource Strain: Not on file  Food Insecurity:  Food Insecurity Present (08/18/2023)   Hunger Vital Sign    Worried About Running Out of Food in the Last Year: Sometimes true    Ran Out of Food in the Last Year: Sometimes true  Transportation Needs: No Transportation Needs (08/18/2023)   PRAPARE - Administrator, Civil Service (Medical): No    Lack of Transportation (Non-Medical): No  Physical Activity: Not on file  Stress: Not on file  Social Connections: Not on file   Additional Social History:                           Current Medications: Current Facility-Administered Medications  Medication Dose Route Frequency Provider Last Rate Last Admin   acetaminophen (TYLENOL) tablet 650 mg  650 mg Oral Q6H PRN Oneta Rack, NP       alum & mag hydroxide-simeth (MAALOX/MYLANTA) 200-200-20 MG/5ML suspension 30 mL  30 mL Oral Q4H PRN Oneta Rack, NP       diphenhydrAMINE (BENADRYL) capsule 50 mg  50 mg Oral TID PRN Oneta Rack, NP       Or   diphenhydrAMINE (BENADRYL) injection 50 mg  50 mg Intramuscular TID PRN Oneta Rack, NP       gabapentin (NEURONTIN) capsule 100 mg  100 mg Oral TID Oneta Rack, NP   100 mg at 08/20/23 1339   haloperidol (HALDOL) tablet 5 mg  5 mg Oral TID PRN Oneta Rack, NP       Or   haloperidol lactate (HALDOL) injection 5 mg  5 mg Intramuscular TID PRN Oneta Rack, NP       hydrochlorothiazide (HYDRODIURIL) tablet 25 mg  25 mg Oral Daily Lauro Franklin, MD   25 mg at 08/20/23 0830   hydrOXYzine (ATARAX) tablet 25 mg  25 mg Oral TID PRN Onuoha, Chinwendu V, NP   25 mg at 08/20/23 1339   LORazepam (ATIVAN) tablet 2 mg  2 mg Oral TID PRN Oneta Rack, NP       Or   LORazepam (ATIVAN) injection 2 mg  2 mg Intramuscular TID PRN Oneta Rack, NP       LORazepam (ATIVAN) tablet 1 mg  1 mg Oral Q6H PRN Lauro Franklin, MD       magnesium hydroxide (MILK OF MAGNESIA) suspension 30 mL  30 mL Oral Daily PRN Oneta Rack, NP       melatonin tablet 3 mg   3 mg Oral QHS PRN Onuoha, Chinwendu V, NP   3 mg at 08/19/23 2111   metFORMIN (GLUCOPHAGE-XR) 24 hr tablet 500 mg  500 mg Oral Q breakfast Pashayan, Mardelle Matte, MD       potassium chloride (KLOR-CON) CR tablet  10 mEq  10 mEq Oral Daily Lauro Franklin, MD   10 mEq at 08/20/23 0830   [START ON 08/21/2023] QUEtiapine (SEROQUEL) tablet 200 mg  200 mg Oral Daily Hiroki Wint, Harrold Donath, MD       QUEtiapine (SEROQUEL) tablet 400 mg  400 mg Oral QHS Ghazal Pevey, Harrold Donath, MD       QUEtiapine (SEROQUEL) tablet 50 mg  50 mg Oral TID PRN Lauro Franklin, MD   50 mg at 08/19/23 2042    Lab Results:  Results for orders placed or performed during the hospital encounter of 08/18/23 (from the past 48 hour(s))  Comprehensive metabolic panel     Status: Abnormal   Collection Time: 08/20/23  6:38 AM  Result Value Ref Range   Sodium 138 135 - 145 mmol/L   Potassium 3.4 (L) 3.5 - 5.1 mmol/L   Chloride 104 98 - 111 mmol/L   CO2 26 22 - 32 mmol/L   Glucose, Bld 128 (H) 70 - 99 mg/dL    Comment: Glucose reference range applies only to samples taken after fasting for at least 8 hours.   BUN 10 6 - 20 mg/dL   Creatinine, Ser 1.30 0.44 - 1.00 mg/dL   Calcium 9.2 8.9 - 86.5 mg/dL   Total Protein 8.1 6.5 - 8.1 g/dL   Albumin 4.0 3.5 - 5.0 g/dL   AST 17 15 - 41 U/L   ALT 18 0 - 44 U/L   Alkaline Phosphatase 68 38 - 126 U/L   Total Bilirubin 0.5 0.3 - 1.2 mg/dL   GFR, Estimated >78 >46 mL/min    Comment: (NOTE) Calculated using the CKD-EPI Creatinine Equation (2021)    Anion gap 8 5 - 15    Comment: Performed at [facility], [address] [place], [place] [address]  Lipid panel     Status: Abnormal   Collection Time: 08/20/23  6:38 AM  Result Value Ref Range   Cholesterol 202 (H) 0 - 200 mg/dL   Triglycerides 76 <284 mg/dL   HDL 57 >13 mg/dL   Total CHOL/HDL Ratio 3.5 RATIO   VLDL 15 0 - 40 mg/dL   LDL Cholesterol 244 (H) 0 - 99 mg/dL    Comment:        Total  Cholesterol/HDL:CHD Risk Coronary Heart Disease Risk Table                     Men   Women  1/2 Average Risk   3.4   3.3  Average Risk       5.0   4.4  2 X Average Risk   9.6   7.1  3 X Average Risk  23.4   11.0        Use the calculated Patient Ratio above and the CHD Risk Table to determine the patient's CHD Risk.        ATP III CLASSIFICATION (LDL):  <100     mg/dL   Optimal  010-272  mg/dL   Near or Above                    Optimal  130-159  mg/dL   Borderline  536-644  mg/dL   High  >034     mg/dL   Very High Performed at [facility], [address], [place], [place] [address]   Hemoglobin A1c     Status: Abnormal   Collection Time: 08/20/23  6:38 AM  Result  Value Ref Range   Hgb A1c MFr Bld 5.9 (H) 4.8 - 5.6 %    Comment: (NOTE) Pre diabetes:          5.7%-6.4%  Diabetes:              >6.4%  Glycemic control for   <7.0% adults with diabetes    Mean Plasma Glucose 122.63 mg/dL    Comment: Performed at [facility], [address], [place], [place] [address]  TSH     Status: None   Collection Time: 08/20/23  6:38 AM  Result Value Ref Range   TSH 0.698 0.350 - 4.500 uIU/mL    Comment: Performed by a 3rd Generation assay with a functional sensitivity of <=0.01 uIU/mL. Performed at [facility], [address], [place], [place] [address]     Blood Alcohol level:  Lab Results  Component Value Date   [address] [facility] <10 08/17/2023   ETH <10 04/22/2023    Metabolic Disorder Labs: Lab Results  Component Value Date   HGBA1C 5.9 (H) 08/20/2023   MPG 122.63 08/20/2023   No results found for: "PROLACTIN" Lab Results  Component Value Date   CHOL 202 (H) 08/20/2023   TRIG 76 08/20/2023   HDL 57 08/20/2023   CHOLHDL 3.5 08/20/2023   VLDL 15 08/20/2023   LDLCALC 130 (H) 08/20/2023    Physical Findings: AIMS: Facial and Oral Movements Muscles of Facial Expression: None, normal Lips and Perioral Area: None, normal Jaw: None,  normal Tongue: None, normal,Extremity Movements Upper (arms, wrists, hands, fingers): None, normal Lower (legs, knees, ankles, toes): None, normal, Trunk Movements Neck, shoulders, hips: None, normal, Overall Severity Severity of abnormal movements (highest score from questions above): None, normal Incapacitation due to abnormal movements: None, normal Patient's awareness of abnormal movements (rate only patient's report): No Awareness, Dental Status Current problems with teeth and/or dentures?: No Does patient usually wear dentures?: No  CIWA:  CIWA-Ar Total: 10 COWS:     Musculoskeletal: Strength & Muscle Tone: within normal limits Gait & Station: normal Patient leans: N/A  Psychiatric Specialty Exam:  Presentation  General Appearance:  Fairly Groomed  Eye Contact: Fair  Speech: Normal Rate  Speech Volume: Normal  Handedness: Right   Mood and Affect  Mood: Anxious; Irritable  Affect: Labile; Tearful   Thought Process  Thought Processes: Linear  Descriptions of Associations:Intact  Orientation:Full (Time, Place and Person)  Thought Content:WDL  History of Schizophrenia/Schizoaffective disorder:Yes  Duration of Psychotic Symptoms:Greater than six months  Hallucinations:Hallucinations: Auditory Description of Auditory Hallucinations: things falling from the ceiling  Ideas of Reference:Delusions  Suicidal Thoughts:Suicidal Thoughts: No  Homicidal Thoughts:Homicidal Thoughts: No   Sensorium  Memory: Immediate Fair; Recent Fair; Remote Fair  Judgment: Impaired  Insight: Lacking   Executive Functions  Concentration: Fair  Attention Span: Fair  Recall: Fair  Fund of Knowledge: Fair  Language: Good   Psychomotor Activity  Psychomotor Activity: Psychomotor Activity: Normal   Assets  Assets: Communication Skills; Desire for Improvement; Resilience; Social Support   Sleep  Sleep: Sleep: Poor    Physical  Exam: Physical Exam Vitals reviewed.  Constitutional:      General: She is not in acute distress.    Appearance: She is not toxic-appearing.  Pulmonary:     Effort: Pulmonary effort is normal.  Neurological:     Mental Status: She is alert.     Motor: No weakness.     Gait: Gait normal.    Review of Systems  Constitutional:  Negative for chills and fever.  Cardiovascular:  Negative for chest pain and palpitations.  Neurological:  Negative for dizziness, tingling, tremors and headaches.  Psychiatric/Behavioral:  Positive for hallucinations and substance abuse. Negative for depression, memory loss and suicidal ideas. The patient is nervous/anxious. The patient does not have insomnia.   All other systems reviewed and are negative.  Blood pressure 92/63, pulse (!) 105, temperature 98 F (36.7 C), temperature source Oral, resp. rate 20, height 5\' 2"  (1.575 m), weight 119.2 kg, last menstrual period 03/13/2016, SpO2 100%. Body mass index is 48.07 kg/m.   Treatment Plan Summary: Daily contact with patient to assess and evaluate symptoms and progress in treatment and Medication management   ASSESSMENT:  Diagnoses / Active Problems: Schizoaffective disorder bipolar type    PLAN: Safety and Monitoring:  --  Voluntary admission to inpatient psychiatric unit for safety, stabilization and treatment  -- Daily contact with patient to assess and evaluate symptoms and progress in treatment  -- Patient's case to be discussed in multi-disciplinary team meeting  -- Observation Level : q15 minute checks  -- Vital signs:  q12 hours  -- Precautions: suicide, elopement, and assault  2. Psychiatric Diagnoses and Treatment:     -change seroquel from 200 mg bid to -> 200 mg qam and 400 mg at bedtime  -dc lexapro due to concern for mood cycling  -continue gabapentin 100 mg tid (home med) -continue seroquel 50 mg tid prn for anxiety -continue metformin 500 mg every day for metabolic s/e of SGA    -did not restart xanax (home med)    --  The risks/benefits/side-effects/alternatives to this medication were discussed in detail with the patient and time was given for questions. The patient consents to medication trial.    -- Metabolic profile and EKG monitoring obtained while on an atypical antipsychotic (BMI: Lipid Panel: HbgA1c: QTc:)   -- Encouraged patient to participate in unit milieu and in scheduled group therapies     3. Medical Issues Being Addressed:    HTN on hydrochlorothiazide and potassium   4. Discharge Planning:   -- Social work and case management to assist with discharge planning and identification of hospital follow-up needs prior to discharge  -- Estimated LOS: 3-4 days  -- Discharge Concerns: Need to establish a safety plan; Medication compliance and effectiveness  -- Discharge Goals: Return home with outpatient referrals for mental health follow-up including medication management/psychotherapy    Cristy Hilts, MD 08/20/2023, 3:01 PM  Total Time Spent in Direct Patient Care:  I personally spent 35 minutes on the unit in direct patient care. The direct patient care time included face-to-face time with the patient, reviewing the patient's chart, communicating with other professionals, and coordinating care. Greater than 50% of this time was spent in counseling or coordinating care with the patient regarding goals of hospitalization, psycho-education, and discharge planning needs.   Phineas Inches, MD Psychiatrist

## 2023-08-20 NOTE — BH IP Treatment Plan (Signed)
Interdisciplinary Treatment and Diagnostic Plan Update  08/20/2023 Time of Session: 11:20 AM  Rachel Ray MRN: [id number]  Principal Diagnosis: Schizophrenia [facility])  Secondary Diagnoses: Principal Problem:   Schizophrenia (HCC)   Current Medications:  Current Facility-Administered Medications  Medication Dose Route Frequency Provider Last Rate Last Admin   acetaminophen (TYLENOL) tablet 650 mg  650 mg Oral Q6H PRN Oneta Rack, NP       alum & mag hydroxide-simeth (MAALOX/MYLANTA) 200-200-20 MG/5ML suspension 30 mL  30 mL Oral Q4H PRN Oneta Rack, NP       diphenhydrAMINE (BENADRYL) capsule 50 mg  50 mg Oral TID PRN Oneta Rack, NP       Or   diphenhydrAMINE (BENADRYL) injection 50 mg  50 mg Intramuscular TID PRN Oneta Rack, NP       gabapentin (NEURONTIN) capsule 100 mg  100 mg Oral TID Oneta Rack, NP   100 mg at 08/20/23 0830   haloperidol (HALDOL) tablet 5 mg  5 mg Oral TID PRN Oneta Rack, NP       Or   haloperidol lactate (HALDOL) injection 5 mg  5 mg Intramuscular TID PRN Oneta Rack, NP       hydrochlorothiazide (HYDRODIURIL) tablet 25 mg  25 mg Oral Daily Lauro Franklin, MD   25 mg at 08/20/23 0830   hydrOXYzine (ATARAX) tablet 25 mg  25 mg Oral TID PRN Onuoha, Chinwendu V, NP   25 mg at 08/19/23 2111   LORazepam (ATIVAN) tablet 2 mg  2 mg Oral TID PRN Oneta Rack, NP       Or   LORazepam (ATIVAN) injection 2 mg  2 mg Intramuscular TID PRN Oneta Rack, NP       LORazepam (ATIVAN) tablet 1 mg  1 mg Oral Q6H PRN Lauro Franklin, MD       magnesium hydroxide (MILK OF MAGNESIA) suspension 30 mL  30 mL Oral Daily PRN Oneta Rack, NP       melatonin tablet 3 mg  3 mg Oral QHS PRN Onuoha, Chinwendu V, NP   3 mg at 08/19/23 2111   metFORMIN (GLUCOPHAGE-XR) 24 hr tablet 500 mg  500 mg Oral Q breakfast Pashayan, Mardelle Matte, MD       potassium chloride (KLOR-CON) CR tablet 10 mEq  10 mEq Oral Daily Lauro Franklin, MD   10  mEq at 08/20/23 0830   [START ON 08/21/2023] QUEtiapine (SEROQUEL) tablet 200 mg  200 mg Oral Daily Massengill, Nathan, MD       QUEtiapine (SEROQUEL) tablet 400 mg  400 mg Oral QHS Massengill, Nathan, MD       QUEtiapine (SEROQUEL) tablet 50 mg  50 mg Oral TID PRN Lauro Franklin, MD   50 mg at 08/19/23 2042   PTA Medications: Medications Prior to Admission  Medication Sig Dispense Refill Last Dose   ALPRAZolam (XANAX) 1 MG tablet Take 1 mg by mouth 3 (three) times daily.      cloNIDine (CATAPRES) 0.2 MG tablet Take 0.2 mg by mouth 2 (two) times daily.      gabapentin (NEURONTIN) 100 MG capsule Take 100 mg by mouth 3 (three) times daily.      hydrochlorothiazide (HYDRODIURIL) 25 MG tablet Take 25 mg by mouth daily.      ibuprofen (ADVIL) 200 MG tablet Take 200 mg by mouth every 6 (six) hours as needed for mild pain.  potassium chloride (KLOR-CON) 10 MEQ tablet Take 10 mEq by mouth daily.      QUEtiapine (SEROQUEL) 200 MG tablet Take 200 mg by mouth at bedtime.       Patient Stressors:    Patient Strengths:    Treatment Modalities: Medication Management, Group therapy, Case management,  1 to 1 session with clinician, Psychoeducation, Recreational therapy.   Physician Treatment Plan for Primary Diagnosis: Schizophrenia (HCC) Long Term Goal(s): Improvement in symptoms so as ready for discharge   Short Term Goals: Ability to identify changes in lifestyle to reduce recurrence of condition will improve Ability to verbalize feelings will improve Ability to demonstrate self-control will improve Ability to identify and develop effective coping behaviors will improve Ability to maintain clinical measurements within normal limits will improve Compliance with prescribed medications will improve Ability to identify triggers associated with substance abuse/mental health issues will improve  Medication Management: Evaluate patient's response, side effects, and tolerance of medication  regimen.  Therapeutic Interventions: 1 to 1 sessions, Unit Group sessions and Medication administration.  Evaluation of Outcomes: Not Progressing  Physician Treatment Plan for Secondary Diagnosis: Principal Problem:   Schizophrenia (HCC)  Long Term Goal(s): Improvement in symptoms so as ready for discharge   Short Term Goals: Ability to identify changes in lifestyle to reduce recurrence of condition will improve Ability to verbalize feelings will improve Ability to demonstrate self-control will improve Ability to identify and develop effective coping behaviors will improve Ability to maintain clinical measurements within normal limits will improve Compliance with prescribed medications will improve Ability to identify triggers associated with substance abuse/mental health issues will improve     Medication Management: Evaluate patient's response, side effects, and tolerance of medication regimen.  Therapeutic Interventions: 1 to 1 sessions, Unit Group sessions and Medication administration.  Evaluation of Outcomes: Not Progressing   RN Treatment Plan for Primary Diagnosis: Schizophrenia (HCC) Long Term Goal(s): Knowledge of disease and therapeutic regimen to maintain health will improve  Short Term Goals: Ability to remain free from injury will improve, Ability to verbalize frustration and anger appropriately will improve, Ability to demonstrate self-control, Ability to participate in decision making will improve, Ability to verbalize feelings will improve, Ability to disclose and discuss suicidal ideas, Ability to identify and develop effective coping behaviors will improve, and Compliance with prescribed medications will improve  Medication Management: RN will administer medications as ordered by provider, will assess and evaluate patient's response and provide education to patient for prescribed medication. RN will report any adverse and/or side effects to prescribing  provider.  Therapeutic Interventions: 1 on 1 counseling sessions, Psychoeducation, Medication administration, Evaluate responses to treatment, Monitor vital signs and CBGs as ordered, Perform/monitor CIWA, COWS, AIMS and Fall Risk screenings as ordered, Perform wound care treatments as ordered.  Evaluation of Outcomes: Not Progressing   LCSW Treatment Plan for Primary Diagnosis: Schizophrenia (HCC) Long Term Goal(s): Safe transition to appropriate next level of care at discharge, Engage patient in therapeutic group addressing interpersonal concerns.  Short Term Goals: Engage patient in aftercare planning with referrals and resources, Increase social support, Increase ability to appropriately verbalize feelings, Increase emotional regulation, Facilitate acceptance of mental health diagnosis and concerns, Facilitate patient progression through stages of change regarding substance use diagnoses and concerns, Identify triggers associated with mental health/substance abuse issues, and Increase skills for wellness and recovery  Therapeutic Interventions: Assess for all discharge needs, 1 to 1 time with Social worker, Explore available resources and support systems, Assess for adequacy in community support  network, Educate family and significant other(s) on suicide prevention, Complete Psychosocial Assessment, Interpersonal group therapy.  Evaluation of Outcomes: Not Progressing   Progress in Treatment: Attending groups: Yes. Participating in groups: Yes. Taking medication as prescribed: Yes. Toleration medication: Yes. Family/Significant other contact made: Yes, individual(s) contacted:  Mom [name]  Patient understands diagnosis: No. Discussing patient identified problems/goals with staff: Yes. Medical problems stabilized or resolved: Yes. Denies suicidal/homicidal ideation: Yes. Issues/concerns per patient self-inventory: Yes.   New problem(s) identified: No, Describe:  None reported    New Short Term/Long Term Goal(s):medication stabilization, elimination of SI thoughts, development of comprehensive mental wellness plan.    Patient Goals:  " Get back on my medication and getting something from anxiety "   Discharge Plan or Barriers: Patient recently admitted. CSW will continue to follow and assess for appropriate referrals and possible discharge planning.    Reason for Continuation of Hospitalization: Hallucinations Medication stabilization  Estimated Length of Stay: 5-7 days   Last 3 [place] Suicide Severity Risk Score: Flowsheet Row [facility] (Current[facility] Most recent reading at 08/18/2023  9:00 PM ED from 08/17/2023 in [facility] Department at [facility] Most recent reading at 08/18/2023  5:18 AM ED from 08/17/2023 in [facility] Department at [facility] Most recent reading at 08/17/2023  8:08 AM  C-SSRS RISK CATEGORY No Risk No Risk No Risk       Last PHQ 2/9 Scores:    04/24/2023   12:15 PM 10/20/2016    3:04 PM  Depression screen PHQ 2/9  Decreased Interest 1 0  Down, Depressed, Hopeless 1 0  PHQ - 2 Score 2 0  Altered sleeping 1   Tired, decreased energy 1   Change in appetite 0   Feeling bad or failure about yourself  0   Trouble concentrating 1   Moving slowly or fidgety/restless 0   Suicidal thoughts 0   PHQ-9 Score 5   Difficult doing work/chores Somewhat difficult     Scribe for Treatment Team: Beather Arbour 08/20/2023 1:38 PM

## 2023-08-20 NOTE — Plan of Care (Signed)
?  Problem: Education: ?Goal: Knowledge of [facility] Education information/materials will improve ?Outcome: Not Progressing ?Goal: Emotional status will improve ?Outcome: Not Progressing ?Goal: Mental status will improve ?Outcome: Not Progressing ?Goal: Verbalization of understanding the information provided will improve ?Outcome: Not Progressing ?  ?Problem: Activity: ?Goal: Interest or engagement in activities will improve ?Outcome: Not Progressing ?Goal: Sleeping patterns will improve ?Outcome: Not Progressing ?  ?Problem: Coping: ?Goal: Ability to verbalize frustrations and anger appropriately will improve ?Outcome: Not Progressing ?Goal: Ability to demonstrate self-control will improve ?Outcome: Not Progressing ?  ?Problem: Health Behavior/Discharge Planning: ?Goal: Identification of resources available to assist in meeting health care needs will improve ?Outcome: Not Progressing ?Goal: Compliance with treatment plan for underlying cause of condition will improve ?Outcome: Not Progressing ?  ?Problem: Physical Regulation: ?Goal: Ability to maintain clinical measurements within normal limits will improve ?Outcome: Not Progressing ?  ?Problem: Safety: ?Goal: Periods of time without injury will increase ?Outcome: Not Progressing ?  ?Problem: Education: ?Goal: Ability to state activities that reduce stress will improve ?Outcome: Not Progressing ?  ?Problem: Coping: ?Goal: Ability to identify and develop effective coping behavior will improve ?Outcome: Not Progressing ?  ?Problem: Self-Concept: ?Goal: Ability to identify factors that promote anxiety will improve ?Outcome: Not Progressing ?Goal: Level of anxiety will decrease ?Outcome: Not Progressing ?Goal: Ability to modify response to factors that promote anxiety will improve ?Outcome: Not Progressing ?  ?Problem: Education: ?Goal: Utilization of techniques to improve thought processes will improve ?Outcome: Not Progressing ?Goal: Knowledge of the  prescribed therapeutic regimen will improve ?Outcome: Not Progressing ?  ?Problem: Activity: ?Goal: Interest or engagement in leisure activities will improve ?Outcome: Not Progressing ?Goal: Imbalance in normal sleep/wake cycle will improve ?Outcome: Not Progressing ?  ?Problem: Coping: ?Goal: Coping ability will improve ?Outcome: Not Progressing ?Goal: Will verbalize feelings ?Outcome: Not Progressing ?  ?Problem: Health Behavior/Discharge Planning: ?Goal: Ability to make decisions will improve ?Outcome: Not Progressing ?Goal: Compliance with therapeutic regimen will improve ?Outcome: Not Progressing ?  ?Problem: Role Relationship: ?Goal: Will demonstrate positive changes in social behaviors and relationships ?Outcome: Not Progressing ?  ?Problem: Safety: ?Goal: Ability to disclose and discuss suicidal ideas will improve ?Outcome: Not Progressing ?Goal: Ability to identify and utilize support systems that promote safety will improve ?Outcome: Not Progressing ?  ?Problem: Self-Concept: ?Goal: Will verbalize positive feelings about self ?Outcome: Not Progressing ?Goal: Level of anxiety will decrease ?Outcome: Not Progressing ?  ?

## 2023-08-20 NOTE — Group Note (Signed)
Date:  08/20/2023 Time:  10:05 AM  Group Topic/Focus:  Goals Group:   The focus of this group is to help patients establish daily goals to achieve during treatment and discuss how the patient can incorporate goal setting into their daily lives to aide in recovery.    Participation Level:  None  Additional Comments:  Pt stated she did not want to share a goal this morning.   [employer] Deriyah Kunath 08/20/2023, 10:05 AM

## 2023-08-21 MED ORDER — WHITE PETROLATUM EX OINT
TOPICAL_OINTMENT | CUTANEOUS | Status: AC
  Filled 2023-08-21: qty 5

## 2023-08-21 NOTE — Progress Notes (Signed)
[facility] MD Progress Note  08/21/2023 12:50 PM Rachel Ray  MRN:  [id number] Subjective:  Rachel Ray is a 42 yr old female who presented on 9/25 to [facility] with complaints of Auditory and Tactile Hallucinations due to stopping her medications, she was admitted to [facility] on 9/26. PPHx is significant for Schizophrenia, Bipolar Disorder, Depression, Anxiety, PTSD, and a history of Opioid Abuse, and 4 Suicide Attempts (last-cut wrists 2017), Remote history of Self Injurious Behavior (Cutting-last early 20's), and ~10 Prior Psychiatric Hospitalizations (last- [facility] 06/2023).   During interview today patient reports that she is is no longer having AH. Patient reports that she recalls that when she does not take her medications she may hear sounds "like a ball or something is dropping" constantly, but she has not heard this today. Patient reports that she did not sleep well last night and reports that it may be due to physical discomfort. When asked by provider about her worries of "spiritual attacks" that she told staff about last night, patient recalls this and reports that sometimes she feels like when her mental health is not in a good place, she feels she is going through something more spiritual, since it is not related to her body. Patient endorses she is not concerned about this today and reports that she knows that her symptoms are more related to not taking her medication, and as of today she is starting to feel better and closer to her baseline. Patient endorses that at baseline she normally prefers to be around people, because she will feel alone and start to think about her divorce if she does not. Patient reports she wishes she would stop thinking about this, but her coping skill of being and talking to others is her best mechanism to help with this. Patient denies feeling anxious on assessment today.  Patient endorses a fair appetite (she does not care for the taste of the food but denies being  worried about it being poisoned) and reports that she had a BM today. Patient denies any nausea or chest pain. Patient denies SI, HI, and AVH today. Patient also denies feeling paranoid or preoccupied with spiritual concerns today. Patient reports that she is feeling tired today due to not sleeping well and feels she will sleep better tonight. Objectively patient eyelids appear to be heavy and patient appear to be moving a bit slowly. Patient is still active on the unit, but overall does appear tired.   Principal Problem: Schizophrenia (HCC) Diagnosis: Principal Problem:   Schizophrenia (HCC) Active Problems:   Schizoaffective disorder, bipolar type (HCC)  Total Time spent with patient: 20 minutes  Past Psychiatric History: Schizophrenia, Bipolar Disorder, Depression, Anxiety, PTSD, and a history of Opioid Abuse, and 4 Suicide Attempts (last-cut wrists 2017), Remote history of Self Injurious Behavior (Cutting-last early 20's), and ~10 Prior Psychiatric Hospitalizations (last- [facility] 06/2023).  Seroquel, Haldol, Depakote, Suboxone, Gabapentin, Zoloft, Lexapro   Past Medical History:  Past Medical History:  Diagnosis Date   Anemia    Anxiety    Arthritis    rheumatoid   Chronic pelvic pain in female    Depression    Dysmenorrhea    Hypertension    Menorrhagia    Uterine fibroid     Past Surgical History:  Procedure Laterality Date   ABDOMINAL HYSTERECTOMY N/A 03/25/2016   Procedure: HYSTERECTOMY ABDOMINAL;  Surgeon: Lazaro Arms, MD;  Location: [facility];  Service: Gynecology;  Laterality: N/A;   ADENOIDECTOMY  CESAREAN SECTION     x 3   CHOLECYSTECTOMY     SALPINGOOPHORECTOMY Bilateral 03/25/2016   Procedure: SALPINGO OOPHORECTOMY;  Surgeon: Lazaro Arms, MD;  Location: [facility];  Service: Gynecology;  Laterality: Bilateral;   TONSILLECTOMY     TUBAL LIGATION     Family History:  Family History  Problem Relation Age of Onset   Diabetes Maternal Grandmother    Diabetes  Maternal Grandfather    Cancer Other        great grandmother-breast cancer   Cancer Maternal Aunt        breast cancer   Family Psychiatric  History: See H&P  Social History:  Social History   Substance and Sexual Activity  Alcohol Use No   Alcohol/week: 0.0 standard drinks of alcohol     Social History   Substance and Sexual Activity  Drug Use No    Social History   Socioeconomic History   Marital status: Legally Separated    Spouse name: Not on file   Number of children: Not on file   Years of education: Not on file   Highest education level: Not on file  Occupational History   Not on file  Tobacco Use   Smoking status: Former    Current packs/day: 0.00    Average packs/day: 0.5 packs/day for 15.0 years (7.5 ttl pk-yrs)    Types: Cigarettes    Start date: 01/21/2005    Quit date: 01/22/2020    Years since quitting: 3.5   Smokeless tobacco: Never  Substance and Sexual Activity   Alcohol use: No    Alcohol/week: 0.0 standard drinks of alcohol   Drug use: No   Sexual activity: Yes    Birth control/protection: Surgical  Other Topics Concern   Not on file  Social History Narrative   Not on file   Social Determinants of Health   Financial Resource Strain: Not on file  Food Insecurity: Food Insecurity Present (08/18/2023)   Hunger Vital Sign    Worried About Running Out of Food in the Last Year: Sometimes true    Ran Out of Food in the Last Year: Sometimes true  Transportation Needs: No Transportation Needs (08/18/2023)   PRAPARE - Administrator, Civil Service (Medical): No    Lack of Transportation (Non-Medical): No  Physical Activity: Not on file  Stress: Not on file  Social Connections: Not on file   Additional Social History:                         Sleep: Poor  Appetite:  Fair  Current Medications: Current Facility-Administered Medications  Medication Dose Route Frequency Provider Last Rate Last Admin   acetaminophen (TYLENOL)  tablet 650 mg  650 mg Oral Q6H PRN Oneta Rack, NP   650 mg at 08/20/23 1645   alum & mag hydroxide-simeth (MAALOX/MYLANTA) 200-200-20 MG/5ML suspension 30 mL  30 mL Oral Q4H PRN Oneta Rack, NP       cloNIDine (CATAPRES) tablet 0.2 mg  0.2 mg Oral BID Dixon, Rashaun M, NP   0.2 mg at 08/20/23 2103   diphenhydrAMINE (BENADRYL) capsule 50 mg  50 mg Oral TID PRN Oneta Rack, NP       Or   diphenhydrAMINE (BENADRYL) injection 50 mg  50 mg Intramuscular TID PRN Oneta Rack, NP       gabapentin (NEURONTIN) capsule 100 mg  100 mg Oral TID Oneta Rack,  NP   100 mg at 08/21/23 8295   haloperidol (HALDOL) tablet 5 mg  5 mg Oral TID PRN Oneta Rack, NP       Or   haloperidol lactate (HALDOL) injection 5 mg  5 mg Intramuscular TID PRN Oneta Rack, NP       hydrochlorothiazide (HYDRODIURIL) tablet 25 mg  25 mg Oral Daily Lauro Franklin, MD   25 mg at 08/21/23 6213   hydrOXYzine (ATARAX) tablet 25 mg  25 mg Oral TID PRN Onuoha, Chinwendu V, NP   25 mg at 08/20/23 1339   LORazepam (ATIVAN) tablet 2 mg  2 mg Oral TID PRN Oneta Rack, NP       Or   LORazepam (ATIVAN) injection 2 mg  2 mg Intramuscular TID PRN Oneta Rack, NP       LORazepam (ATIVAN) tablet 1 mg  1 mg Oral Q6H PRN Lauro Franklin, MD       magnesium hydroxide (MILK OF MAGNESIA) suspension 30 mL  30 mL Oral Daily PRN Oneta Rack, NP       melatonin tablet 3 mg  3 mg Oral QHS PRN Onuoha, Chinwendu V, NP   3 mg at 08/20/23 2103   metFORMIN (GLUCOPHAGE-XR) 24 hr tablet 500 mg  500 mg Oral Q breakfast Pashayan, Mardelle Matte, MD       potassium chloride (KLOR-CON) CR tablet 10 mEq  10 mEq Oral Daily Lauro Franklin, MD   10 mEq at 08/21/23 0865   QUEtiapine (SEROQUEL) tablet 200 mg  200 mg Oral Daily Massengill, Harrold Donath, MD   200 mg at 08/21/23 0834   QUEtiapine (SEROQUEL) tablet 400 mg  400 mg Oral QHS Massengill, Harrold Donath, MD   400 mg at 08/20/23 2103   QUEtiapine (SEROQUEL) tablet 50 mg  50 mg  Oral TID PRN Lauro Franklin, MD   50 mg at 08/19/23 2042    Lab Results:  Results for orders placed or performed during the hospital encounter of 08/18/23 (from the past 48 hour(s))  Comprehensive metabolic panel     Status: Abnormal   Collection Time: 08/20/23  6:38 AM  Result Value Ref Range   Sodium 138 135 - 145 mmol/L   Potassium 3.4 (L) 3.5 - 5.1 mmol/L   Chloride 104 98 - 111 mmol/L   CO2 26 22 - 32 mmol/L   Glucose, Bld 128 (H) 70 - 99 mg/dL    Comment: Glucose reference range applies only to samples taken after fasting for at least 8 hours.   BUN 10 6 - 20 mg/dL   Creatinine, Ser 7.84 0.44 - 1.00 mg/dL   Calcium 9.2 8.9 - 69.6 mg/dL   Total Protein 8.1 6.5 - 8.1 g/dL   Albumin 4.0 3.5 - 5.0 g/dL   AST 17 15 - 41 U/L   ALT 18 0 - 44 U/L   Alkaline Phosphatase 68 38 - 126 U/L   Total Bilirubin 0.5 0.3 - 1.2 mg/dL   GFR, Estimated >29 >52 mL/min    Comment: (NOTE) Calculated using the CKD-EPI Creatinine Equation (2021)    Anion gap 8 5 - 15    Comment: Performed at [facility], [address] [place], [place] [address]  Lipid panel     Status: Abnormal   Collection Time: 08/20/23  6:38 AM  Result Value Ref Range   Cholesterol 202 (H) 0 - 200 mg/dL   Triglycerides 76 <440 mg/dL   HDL  57 >40 mg/dL   Total CHOL/HDL Ratio 3.5 RATIO   VLDL 15 0 - 40 mg/dL   LDL Cholesterol 409 (H) 0 - 99 mg/dL    Comment:        Total Cholesterol/HDL:CHD Risk Coronary Heart Disease Risk Table                     Men   Women  1/2 Average Risk   3.4   3.3  Average Risk       5.0   4.4  2 X Average Risk   9.6   7.1  3 X Average Risk  23.4   11.0        Use the calculated Patient Ratio above and the CHD Risk Table to determine the patient's CHD Risk.        ATP III CLASSIFICATION (LDL):  <100     mg/dL   Optimal  811-914  mg/dL   Near or Above                    Optimal  130-159  mg/dL   Borderline  782-956  mg/dL   High  >213     mg/dL   Very  High Performed at [facility], [address], [place], [place] [address]   Hemoglobin A1c     Status: Abnormal   Collection Time: 08/20/23  6:38 AM  Result Value Ref Range   Hgb A1c MFr Bld 5.9 (H) 4.8 - 5.6 %    Comment: (NOTE) Pre diabetes:          5.7%-6.4%  Diabetes:              >6.4%  Glycemic control for   <7.0% adults with diabetes    Mean Plasma Glucose 122.63 mg/dL    Comment: Performed at [facility], [address], [place], [place] [address]  TSH     Status: None   Collection Time: 08/20/23  6:38 AM  Result Value Ref Range   TSH 0.698 0.350 - 4.500 uIU/mL    Comment: Performed by a 3rd Generation assay with a functional sensitivity of <=0.01 uIU/mL. Performed at [facility], [address] [place], [place] [address]     Blood Alcohol level:  Lab Results  Component Value Date   [facility] <10 08/17/2023   ETH <10 04/22/2023    Metabolic Disorder Labs: Lab Results  Component Value Date   HGBA1C 5.9 (H) 08/20/2023   MPG 122.63 08/20/2023   No results found for: "PROLACTIN" Lab Results  Component Value Date   CHOL 202 (H) 08/20/2023   TRIG 76 08/20/2023   HDL 57 08/20/2023   CHOLHDL 3.5 08/20/2023   VLDL 15 08/20/2023   LDLCALC 130 (H) 08/20/2023    Physical Findings: AIMS: Facial and Oral Movements Muscles of Facial Expression: None, normal Lips and Perioral Area: None, normal Jaw: None, normal Tongue: None, normal,Extremity Movements Upper (arms, wrists, hands, fingers): None, normal Lower (legs, knees, ankles, toes): None, normal, Trunk Movements Neck, shoulders, hips: None, normal, Overall Severity Severity of abnormal movements (highest score from questions above): None, normal Incapacitation due to abnormal movements: None, normal Patient's awareness of abnormal movements (rate only patient's report): No Awareness, Dental Status Current problems with teeth and/or dentures?: No Does patient usually  wear dentures?: No  CIWA:  CIWA-Ar Total: 3 COWS:     Musculoskeletal: Strength & Muscle Tone: within normal limits Gait &  Station: normal Patient leans: N/A  Psychiatric Specialty Exam:  Presentation  General Appearance:  Appropriate for Environment; Casual  Eye Contact: Fair (eye lids appear heavy)  Speech: Clear and Coherent  Speech Volume: Normal  Handedness: Right   Mood and Affect  Mood: Euthymic  Affect: Flat   Thought Process  Thought Processes: Coherent  Descriptions of Associations:Intact  Orientation:Full (Time, Place and Person)  Thought Content:Logical  History of Schizophrenia/Schizoaffective disorder:Yes  Duration of Psychotic Symptoms:Greater than six months  Hallucinations:Hallucinations: None  Ideas of Reference:None  Suicidal Thoughts:Suicidal Thoughts: No  Homicidal Thoughts:Homicidal Thoughts: No   Sensorium  Memory: Immediate Good; Recent Good  Judgment: Fair  Insight: Fair   Art therapist  Concentration: Fair  Attention Span: Fair  Recall: Fair  Fund of Knowledge: Fair  Language: Good   Psychomotor Activity  Psychomotor Activity: Psychomotor Activity: Decreased   Assets  Assets: Communication Skills; Desire for Improvement; Housing; Resilience   Sleep  Sleep: Sleep: Poor Number of Hours of Sleep: 0.75    Physical Exam: Physical Exam HENT:     Head: Normocephalic and atraumatic.  Pulmonary:     Effort: Pulmonary effort is normal.  Neurological:     General: No focal deficit present.     Mental Status: She is alert.    Review of Systems  Psychiatric/Behavioral:  Negative for hallucinations and suicidal ideas. The patient has insomnia.    Blood pressure 122/89, pulse 84, temperature 97.9 F (36.6 C), temperature source Oral, resp. rate 17, height 5\' 2"  (1.575 m), weight 119.2 kg, last menstrual period 03/13/2016, SpO2 100%. Body mass index is 48.07 kg/m.   Treatment  Plan Summary: Daily contact with patient to assess and evaluate symptoms and progress in treatment and Medication management ASSESSMENT: Patient appears to be slowing down, and feeling necessary side effects of her medication as her delusions appear to be breaking and patient appears to be approaching assumed baseline. Will not increase medication at this time and monitor for improved sleep tonight. Patient does appear to be tired after not sleeping well last night. Patient thoughts appear more logical and insight appears significantly improved.    Diagnoses / Active Problems: Schizoaffective disorder bipolar type      PLAN: Safety and Monitoring:             --  Voluntary admission to inpatient psychiatric unit for safety, stabilization and treatment             -- Daily contact with patient to assess and evaluate symptoms and progress in treatment             -- Patient's case to be discussed in multi-disciplinary team meeting             -- Observation Level : q15 minute checks             -- Vital signs:  q12 hours             -- Precautions: suicide, elopement, and assault   2. Psychiatric Diagnoses and Treatment:                 -Continue seroquel 200 mg qam and 400 mg at bedtime  -continue gabapentin 100 mg tid (home med) -continue seroquel 50 mg tid prn for anxiety -continue metformin 500 mg every day for metabolic s/e of SGA    -Continue to hold home Xanax     --  The risks/benefits/side-effects/alternatives to this medication were discussed in detail with the patient and  time was given for questions. The patient consents to medication trial.                -- Metabolic profile and EKG monitoring obtained while on an atypical antipsychotic (BMI: Lipid Panel: HbgA1c: QTc:)              -- Encouraged patient to participate in unit milieu and in scheduled group therapies                 3. Medical Issues Being Addressed:               HTN on hydrochlorothiazide and  potassium    4. Discharge Planning:              -- Social work and case management to assist with discharge planning and identification of hospital follow-up needs prior to discharge             -- Estimated LOS: 3-4 days             -- Discharge Concerns: Need to establish a safety plan; Medication compliance and effectiveness             -- Discharge Goals: Return home with outpatient referrals for mental health follow-up including medication management/psychotherapy    PGY-4 Bobbye Morton, MD 08/21/2023, 12:50 PM

## 2023-08-21 NOTE — Progress Notes (Signed)
Patient appears pleasant and cooperative. Patient denies SI,HI, and A/V/H today with no plan or intent. Patient refused her metformin this morning despite providing explanation due to it causing her GI upset. Patient compliant with all other medications.Patient received tylenol po prn due to a headache during dinner. Patient socializing appropriately with adequate appetite. No s/s of current distress.

## 2023-08-21 NOTE — Plan of Care (Signed)
  Problem: Education: Goal: Verbalization of understanding the information provided will improve Outcome: Progressing   Problem: Safety: Goal: Periods of time without injury will increase Outcome: Progressing   Problem: Activity: Goal: Sleeping patterns will improve Outcome: Not Progressing

## 2023-08-21 NOTE — Group Note (Signed)
Date:  08/21/2023 Time:  8:50 PM  Group Topic/Focus:  Wrap-Up Group:   The focus of this group is to help patients review their daily goal of treatment and discuss progress on daily workbooks.    Participation Level:  Active  Participation Quality:  Appropriate  Affect:  Appropriate  Cognitive:  Appropriate  Insight: Appropriate  Engagement in Group:  Engaged  Modes of Intervention:  Education and Exploration  Additional Comments:  Patient attended and participated in group tonight. She reports that she likes that she is a respectful person she treat people like she would like to be treated.  Her strength is that she is a good cook.  Lita Mains [facility] 08/21/2023, 8:50 PM

## 2023-08-22 MED ORDER — QUETIAPINE FUMARATE 100 MG PO TABS
100.0000 mg | ORAL_TABLET | Freq: Every day | ORAL | Status: DC
  Administered 2023-08-22 – 2023-08-23 (×2): 100 mg via ORAL
  Filled 2023-08-22 (×5): qty 1

## 2023-08-22 NOTE — Group Note (Signed)
Date:  08/22/2023 Time:  8:57 PM  Group Topic/Focus:  Wrap-Up Group:   The focus of this group is to help patients review their daily goal of treatment and discuss progress on daily workbooks.    Participation Level:  Active  Participation Quality:  Appropriate  Affect:  Appropriate  Cognitive:  Appropriate  Insight: Appropriate  Engagement in Group:  Engaged  Modes of Intervention:  Education and Exploration  Additional Comments:  Patient attended and participated in group tonight. She reports that today was good. She rested last night and is happy to be here.  Rachel Ray [facility] 08/22/2023, 8:57 PM

## 2023-08-22 NOTE — Plan of Care (Signed)
  Problem: Education: Goal: Emotional status will improve Outcome: Progressing Goal: Mental status will improve Outcome: Progressing   Problem: Self-Concept: Goal: Will verbalize positive feelings about self Outcome: Progressing

## 2023-08-22 NOTE — Progress Notes (Signed)
[facility] MD Progress Note  08/22/2023 12:12 PM Rachel Ray  MRN:  [id number] Subjective:  Rachel Ray is a 42 yr old female who presented on 9/25 to [facility] with complaints of Auditory and Tactile Hallucinations due to stopping her medications, she was admitted to [facility] on 9/26. PPHx is significant for Schizophrenia, Bipolar Disorder, Depression, Anxiety, PTSD, and a history of Opioid Abuse, and 4 Suicide Attempts (last-cut wrists 2017), Remote history of Self Injurious Behavior (Cutting-last early 20's), and ~10 Prior Psychiatric Hospitalizations (last- [facility] 06/2023).    Overnight patient had no behavior concerns and required only melatonin 3mg  at bedtime PRN . Prior to MD assessment this AM, patient had refused her Seroquel 200mg  daily.  On assessment this AM patient refused to take her AM dose of Seroqeul because she still felt too drowsy, but offers to go back in take it later in the AM. Patient endorses that she feels slowed down in comparison to presentation on admission. Patient denies racing thoughts.Patient reports that she thinks the medications are working as she is not having AH and denies any delusions of grandeur or persecution. Patient also denies symptoms of paranoia. Patient reports she feels her appetite is good and she ate more breakfast this AM, and feels more comfortable with the food in the cafeteria and feels more confident in her decisions picking her meals. Patient endorses appropriate thought content and insight and makes appropriate jokes. Patient denies SI, HI,and Vh as well.  Patient reports she sleeps very well and finds the medication very helpful with this.  Discussion was had with patient about her refusing metformin and classification as pre-diabetic. Patient reports that she has had metformin in the past and it hurt her stomach, but she is willing to try again, and will make an appt with a new PCP (her normal on retired) to f/u on her pre-diabetes dx. Patient was educated  on lifestyle changes as well.  Principal Problem: Schizophrenia (HCC) Diagnosis: Principal Problem:   Schizophrenia (HCC) Active Problems:   Schizoaffective disorder, bipolar type (HCC)  Total Time spent with patient: 20 minutes  Past Psychiatric History: Schizophrenia, Bipolar Disorder, Depression, Anxiety, PTSD, and a history of Opioid Abuse, and 4 Suicide Attempts (last-cut wrists 2017), Remote history of Self Injurious Behavior (Cutting-last early 20's), and ~10 Prior Psychiatric Hospitalizations (last- [facility] 06/2023).  Seroquel, Haldol, Depakote, Suboxone, Gabapentin, Zoloft, Lexapro   Past Medical History:  Past Medical History:  Diagnosis Date   Anemia    Anxiety    Arthritis    rheumatoid   Chronic pelvic pain in female    Depression    Dysmenorrhea    Hypertension    Menorrhagia    Uterine fibroid     Past Surgical History:  Procedure Laterality Date   ABDOMINAL HYSTERECTOMY N/A 03/25/2016   Procedure: HYSTERECTOMY ABDOMINAL;  Surgeon: Lazaro Arms, MD;  Location: [facility];  Service: Gynecology;  Laterality: N/A;   ADENOIDECTOMY     CESAREAN SECTION     x 3   CHOLECYSTECTOMY     SALPINGOOPHORECTOMY Bilateral 03/25/2016   Procedure: SALPINGO OOPHORECTOMY;  Surgeon: Lazaro Arms, MD;  Location: [facility];  Service: Gynecology;  Laterality: Bilateral;   TONSILLECTOMY     TUBAL LIGATION     Family History:  Family History  Problem Relation Age of Onset   Diabetes Maternal Grandmother    Diabetes Maternal Grandfather    Cancer Other        great grandmother-breast cancer  Cancer Maternal Aunt        breast cancer   Family Psychiatric  History:  see h&P Social History:  Social History   Substance and Sexual Activity  Alcohol Use No   Alcohol/week: 0.0 standard drinks of alcohol     Social History   Substance and Sexual Activity  Drug Use No    Social History   Socioeconomic History   Marital status: Legally Separated    Spouse name: Not on file    Number of children: Not on file   Years of education: Not on file   Highest education level: Not on file  Occupational History   Not on file  Tobacco Use   Smoking status: Former    Current packs/day: 0.00    Average packs/day: 0.5 packs/day for 15.0 years (7.5 ttl pk-yrs)    Types: Cigarettes    Start date: 01/21/2005    Quit date: 01/22/2020    Years since quitting: 3.5   Smokeless tobacco: Never  Substance and Sexual Activity   Alcohol use: No    Alcohol/week: 0.0 standard drinks of alcohol   Drug use: No   Sexual activity: Yes    Birth control/protection: Surgical  Other Topics Concern   Not on file  Social History Narrative   Not on file   Social Determinants of Health   Financial Resource Strain: Not on file  Food Insecurity: Food Insecurity Present (08/18/2023)   Hunger Vital Sign    Worried About Running Out of Food in the Last Year: Sometimes true    Ran Out of Food in the Last Year: Sometimes true  Transportation Needs: No Transportation Needs (08/18/2023)   PRAPARE - Administrator, Civil Service (Medical): No    Lack of Transportation (Non-Medical): No  Physical Activity: Not on file  Stress: Not on file  Social Connections: Not on file   Additional Social History:                         Sleep: Good  Appetite:  Good  Current Medications: Current Facility-Administered Medications  Medication Dose Route Frequency Provider Last Rate Last Admin   acetaminophen (TYLENOL) tablet 650 mg  650 mg Oral Q6H PRN Oneta Rack, NP   650 mg at 08/21/23 1756   alum & mag hydroxide-simeth (MAALOX/MYLANTA) 200-200-20 MG/5ML suspension 30 mL  30 mL Oral Q4H PRN Oneta Rack, NP       cloNIDine (CATAPRES) tablet 0.2 mg  0.2 mg Oral BID Dixon, Rashaun M, NP   0.2 mg at 08/22/23 0758   diphenhydrAMINE (BENADRYL) capsule 50 mg  50 mg Oral TID PRN Oneta Rack, NP       Or   diphenhydrAMINE (BENADRYL) injection 50 mg  50 mg Intramuscular TID PRN  Oneta Rack, NP       gabapentin (NEURONTIN) capsule 100 mg  100 mg Oral TID Oneta Rack, NP   100 mg at 08/22/23 0757   haloperidol (HALDOL) tablet 5 mg  5 mg Oral TID PRN Oneta Rack, NP       Or   haloperidol lactate (HALDOL) injection 5 mg  5 mg Intramuscular TID PRN Oneta Rack, NP       hydrochlorothiazide (HYDRODIURIL) tablet 25 mg  25 mg Oral Daily Lauro Franklin, MD   25 mg at 08/22/23 0758   hydrOXYzine (ATARAX) tablet 25 mg  25 mg Oral TID PRN Welford Roche,  Chinwendu V, NP   25 mg at 08/20/23 1339   LORazepam (ATIVAN) tablet 2 mg  2 mg Oral TID PRN Oneta Rack, NP       Or   LORazepam (ATIVAN) injection 2 mg  2 mg Intramuscular TID PRN Oneta Rack, NP       LORazepam (ATIVAN) tablet 1 mg  1 mg Oral Q6H PRN Lauro Franklin, MD       magnesium hydroxide (MILK OF MAGNESIA) suspension 30 mL  30 mL Oral Daily PRN Oneta Rack, NP       melatonin tablet 3 mg  3 mg Oral QHS PRN Onuoha, Chinwendu V, NP   3 mg at 08/21/23 2047   metFORMIN (GLUCOPHAGE-XR) 24 hr tablet 500 mg  500 mg Oral Q breakfast Lauro Franklin, MD   500 mg at 08/22/23 0757   potassium chloride (KLOR-CON) CR tablet 10 mEq  10 mEq Oral Daily Lauro Franklin, MD   10 mEq at 08/22/23 0757   QUEtiapine (SEROQUEL) tablet 100 mg  100 mg Oral Daily Eliseo Gum B, MD   100 mg at 08/22/23 1052   QUEtiapine (SEROQUEL) tablet 400 mg  400 mg Oral QHS Massengill, Harrold Donath, MD   400 mg at 08/21/23 2047   QUEtiapine (SEROQUEL) tablet 50 mg  50 mg Oral TID PRN Lauro Franklin, MD   50 mg at 08/19/23 2042    Lab Results: No results found for this or any previous visit (from the past 48 hour(s)).  Blood Alcohol level:  Lab Results  Component Value Date   ETH <10 08/17/2023   ETH <10 04/22/2023    Metabolic Disorder Labs: Lab Results  Component Value Date   HGBA1C 5.9 (H) 08/20/2023   MPG 122.63 08/20/2023   No results found for: "PROLACTIN" Lab Results  Component Value Date    CHOL 202 (H) 08/20/2023   TRIG 76 08/20/2023   HDL 57 08/20/2023   CHOLHDL 3.5 08/20/2023   VLDL 15 08/20/2023   LDLCALC 130 (H) 08/20/2023    Physical Findings: AIMS: Facial and Oral Movements Muscles of Facial Expression: None, normal Lips and Perioral Area: None, normal Jaw: None, normal Tongue: None, normal,Extremity Movements Upper (arms, wrists, hands, fingers): None, normal Lower (legs, knees, ankles, toes): None, normal, Trunk Movements Neck, shoulders, hips: None, normal, Overall Severity Severity of abnormal movements (highest score from questions above): None, normal Incapacitation due to abnormal movements: None, normal Patient's awareness of abnormal movements (rate only patient's report): No Awareness, Dental Status Current problems with teeth and/or dentures?: No Does patient usually wear dentures?: No  CIWA:  CIWA-Ar Total: 0 COWS:     Musculoskeletal: Strength & Muscle Tone: within normal limits Gait & Station: normal Patient leans: N/A  Psychiatric Specialty Exam:  Presentation  General Appearance:  Appropriate for Environment; Casual  Eye Contact: Good  Speech: Clear and Coherent  Speech Volume: Normal  Handedness: Right   Mood and Affect  Mood: Euthymic  Affect: Appropriate; Congruent   Thought Process  Thought Processes: Coherent  Descriptions of Associations:Intact  Orientation:Full (Time, Place and Person)  Thought Content:Logical  History of Schizophrenia/Schizoaffective disorder:Yes  Duration of Psychotic Symptoms:Greater than six months  Hallucinations:Hallucinations: None  Ideas of Reference:None  Suicidal Thoughts:Suicidal Thoughts: No  Homicidal Thoughts:Homicidal Thoughts: No   Sensorium  Memory: Immediate Good; Recent Good  Judgment: Fair  Insight: Good   Executive Functions  Concentration: Fair  Attention Span: Good  Recall: Good  Fund of  Knowledge: Good  Language: Good   Psychomotor Activity  Psychomotor Activity: Psychomotor Activity: Normal   Assets  Assets: Communication Skills; Desire for Improvement; Housing; Resilience   Sleep  Sleep: Sleep: Good Number of Hours of Sleep: 6.5    Physical Exam: Physical Exam HENT:     Head: Normocephalic and atraumatic.  Pulmonary:     Effort: Pulmonary effort is normal.  Neurological:     Mental Status: She is alert and oriented to person, place, and time.    Review of Systems  Neurological:  Negative for headaches.  Psychiatric/Behavioral:  Negative for depression, hallucinations and suicidal ideas. The patient does not have insomnia.    Blood pressure (!) 141/96, pulse 78, temperature 98.2 F (36.8 C), temperature source Oral, resp. rate 17, height 5\' 2"  (1.575 m), weight 119.2 kg, last menstrual period 03/13/2016, SpO2 99%. Body mass index is 48.07 kg/m.   Treatment Plan Summary: Daily contact with patient to assess and evaluate symptoms and progress in treatment and Medication management  ASSESSMENT: Patient appears to endorse feeling a bit oversedated. Objectively patient does continue to have some psychomotor retardation but does not have any cogwheeling or rigidity in her muscles. Patient's thought process and content appear to be at baseline, therefore will decrease patient's day time dose to 100mg . Patient was in agreement. Will continue to monitor that patient remains stable on total dose of 500mg  of Seroquel. Will attempt to minimize side effect burden as this will help compliance, patient endorsed today that she will stop medications with adverse side effects, but is aware that Seroquel is beneficial for her and intends to take it.    Diagnoses / Active Problems: Schizoaffective disorder bipolar type      PLAN: Safety and Monitoring:             --  Voluntary admission to inpatient psychiatric unit for safety, stabilization and treatment              -- Daily contact with patient to assess and evaluate symptoms and progress in treatment             -- Patient's case to be discussed in multi-disciplinary team meeting             -- Observation Level : q15 minute checks             -- Vital signs:  q12 hours             -- Precautions: suicide, elopement, and assault   2. Psychiatric Diagnoses and Treatment:                 -Decrease seroquel 200 mg qam to 100mg  daily  - Continue Seroquel  400 mg at bedtime  -continue gabapentin 100 mg tid (home med) -continue seroquel 50 mg tid prn for anxiety -continue metformin 500 mg every day for metabolic s/e of SGA    -Continue to hold home Xanax     --  The risks/benefits/side-effects/alternatives to this medication were discussed in detail with the patient and time was given for questions. The patient consents to medication trial.                -- Metabolic profile and EKG monitoring obtained while on an atypical antipsychotic (BMI: Lipid Panel: HbgA1c: QTc:)              -- Encouraged patient to participate in unit milieu and in scheduled group therapies  3. Medical Issues Being Addressed:               HTN on hydrochlorothiazide and potassium    4. Discharge Planning:              -- Social work and case management to assist with discharge planning and identification of hospital follow-up needs prior to discharge             -- Estimated LOS: 3-4 days             -- Discharge Concerns: Need to establish a safety plan; Medication compliance and effectiveness             -- Discharge Goals: Return home with outpatient referrals for mental health follow-up including medication management/psychotherapy    PGY-4 Bobbye Morton, MD 08/22/2023, 12:12 PM

## 2023-08-22 NOTE — Progress Notes (Signed)
   08/22/23 1052  Psych Admission Type (Psych Patients Only)  Admission Status Voluntary  Psychosocial Assessment  Patient Complaints None  Eye Contact Fair  Facial Expression Flat  Affect Appropriate to circumstance  Speech Logical/coherent  Interaction Assertive  Motor Activity Slow  Appearance/Hygiene Unremarkable  Behavior Characteristics Cooperative  Mood Pleasant  Thought Process  Coherency WDL  Content WDL  Delusions None reported or observed  Perception WDL  Hallucination None reported or observed  Judgment Poor  Confusion None  Danger to Self  Current suicidal ideation? Denies  Danger to Others  Danger to Others None reported or observed

## 2023-08-22 NOTE — Progress Notes (Signed)
   08/20/23 2105  Psych Admission Type (Psych Patients Only)  Admission Status Voluntary  Psychosocial Assessment  Patient Complaints Insomnia;Depression;Worrying  Eye Contact Fair  Facial Expression Flat;Sad  Affect Appropriate to circumstance  Speech Logical/coherent  Interaction Assertive  Motor Activity Slow  Appearance/Hygiene Unremarkable  Behavior Characteristics Cooperative;Appropriate to situation  Mood Depressed;Pleasant  Aggressive Behavior  Effect No apparent injury  Thought Process  Coherency Circumstantial  Content Blaming self  Delusions None reported or observed  Perception Hallucinations  Hallucination Auditory  Judgment WDL  Confusion None  Danger to Self  Current suicidal ideation? Denies  Danger to Others  Danger to Others None reported or observed

## 2023-08-22 NOTE — Plan of Care (Signed)
  Problem: Self-Concept: Goal: Level of anxiety will decrease Outcome: Progressing   Problem: Activity: Goal: Imbalance in normal sleep/wake cycle will improve Outcome: Progressing   Problem: Self-Concept: Goal: Level of anxiety will decrease Outcome: Progressing

## 2023-08-23 DIAGNOSIS — F201 Disorganized schizophrenia: Secondary | ICD-10-CM | POA: Diagnosis not present

## 2023-08-23 MED ORDER — QUETIAPINE FUMARATE 100 MG PO TABS: 100.0000 mg | ORAL_TABLET | Freq: Every morning | ORAL | 0 refills | Status: DC

## 2023-08-23 MED ORDER — MELATONIN 3 MG PO TABS: 3.0000 mg | ORAL_TABLET | Freq: Every evening | ORAL | Status: AC | PRN

## 2023-08-23 MED ORDER — CLONIDINE HCL 0.2 MG PO TABS: 0.2000 mg | ORAL_TABLET | Freq: Two times a day (BID) | ORAL | 0 refills | Status: DC

## 2023-08-23 MED ORDER — QUETIAPINE FUMARATE 400 MG PO TABS: 400.0000 mg | ORAL_TABLET | Freq: Every day | ORAL | 0 refills | Status: DC

## 2023-08-23 MED ORDER — HYDROCHLOROTHIAZIDE 25 MG PO TABS: 25.0000 mg | ORAL_TABLET | Freq: Every day | ORAL | 0 refills | Status: DC

## 2023-08-23 MED ORDER — POTASSIUM CHLORIDE ER 10 MEQ PO TBCR: 10.0000 meq | EXTENDED_RELEASE_TABLET | Freq: Every day | ORAL | 0 refills | Status: DC

## 2023-08-23 MED ORDER — GABAPENTIN 100 MG PO CAPS: 100.0000 mg | ORAL_CAPSULE | Freq: Three times a day (TID) | ORAL | 0 refills | Status: AC

## 2023-08-23 MED ORDER — METFORMIN HCL ER 500 MG PO TB24: 500.0000 mg | ORAL_TABLET | Freq: Every day | ORAL | 0 refills | Status: DC

## 2023-08-23 MED ORDER — HYDROXYZINE HCL 25 MG PO TABS: 25.0000 mg | ORAL_TABLET | Freq: Three times a day (TID) | ORAL | 0 refills | Status: DC | PRN

## 2023-08-23 NOTE — Plan of Care (Signed)
  Problem: Education: Goal: Knowledge of [facility] Education information/materials will improve Outcome: Adequate for Discharge Goal: Emotional status will improve Outcome: Adequate for Discharge Goal: Mental status will improve Outcome: Adequate for Discharge Goal: Verbalization of understanding the information provided will improve Outcome: Adequate for Discharge   Problem: Activity: Goal: Interest or engagement in activities will improve Outcome: Adequate for Discharge Goal: Sleeping patterns will improve Outcome: Adequate for Discharge   Problem: Coping: Goal: Ability to verbalize frustrations and anger appropriately will improve Outcome: Adequate for Discharge Goal: Ability to demonstrate self-control will improve Outcome: Adequate for Discharge   Problem: Health Behavior/Discharge Planning: Goal: Identification of resources available to assist in meeting health care needs will improve Outcome: Adequate for Discharge Goal: Compliance with treatment plan for underlying cause of condition will improve Outcome: Adequate for Discharge   Problem: Physical Regulation: Goal: Ability to maintain clinical measurements within normal limits will improve Outcome: Adequate for Discharge   Problem: Safety: Goal: Periods of time without injury will increase Outcome: Adequate for Discharge   Problem: Education: Goal: Ability to state activities that reduce stress will improve Outcome: Adequate for Discharge   Problem: Coping: Goal: Ability to identify and develop effective coping behavior will improve Outcome: Adequate for Discharge   Problem: Self-Concept: Goal: Ability to identify factors that promote anxiety will improve Outcome: Adequate for Discharge Goal: Level of anxiety will decrease Outcome: Adequate for Discharge Goal: Ability to modify response to factors that promote anxiety will improve Outcome: Adequate for Discharge   Problem: Education: Goal:  Utilization of techniques to improve thought processes will improve Outcome: Adequate for Discharge Goal: Knowledge of the prescribed therapeutic regimen will improve Outcome: Adequate for Discharge   Problem: Activity: Goal: Interest or engagement in leisure activities will improve Outcome: Adequate for Discharge Goal: Imbalance in normal sleep/wake cycle will improve Outcome: Adequate for Discharge   Problem: Coping: Goal: Coping ability will improve Outcome: Adequate for Discharge Goal: Will verbalize feelings Outcome: Adequate for Discharge   Problem: Health Behavior/Discharge Planning: Goal: Ability to make decisions will improve Outcome: Adequate for Discharge Goal: Compliance with therapeutic regimen will improve Outcome: Adequate for Discharge   Problem: Role Relationship: Goal: Will demonstrate positive changes in social behaviors and relationships Outcome: Adequate for Discharge   Problem: Safety: Goal: Ability to disclose and discuss suicidal ideas will improve Outcome: Adequate for Discharge Goal: Ability to identify and utilize support systems that promote safety will improve Outcome: Adequate for Discharge   Problem: Self-Concept: Goal: Will verbalize positive feelings about self Outcome: Adequate for Discharge Goal: Level of anxiety will decrease Outcome: Adequate for Discharge   

## 2023-08-23 NOTE — Progress Notes (Signed)
  [facility] Adult Case Management Discharge Plan :  Will you be returning to the same living situation after discharge:  Yes,  Pt will be returning back to her home At discharge, do you have transportation home?: Yes,  Csw will arrange a Taxi through [facility] @ 1:00pm  Do you have the ability to pay for your medications: Yes,  Pt has a [employer]   Release of information consent forms completed and in the chart;  Patient's signature needed at discharge.  Patient to Follow up at:  Follow-up Information     Services, [facility]. Go on 08/25/2023.   Why: You have a hospital follow up appointment for therapy and medication management services on 08/25/23 at 9:00 am .  This appointment will be held in person.  * Please bring photo ID, Insurance cards to this appt. Contact information: [address] [place] [address] [phone]         [facility] Follow up on 09/15/2023.   Why: You have a hospital follow up appointment with this provider on Wednesday 09/15/2023 @ 3:40 PM in-person. Contact information: [address] [place], [place] [address].  Main: [phone] Fax: [phone]                Next level of care provider has access to [facility] Link:yes  Safety Planning and Suicide Prevention discussed: Yes,  [name] (grandma )      Has patient been referred to the Quitline?: Patient does not use tobacco/nicotine products. Pt use to smoke back in 2021  Patient has been referred for addiction treatment: No known substance use disorder.  Isabella Bowens, LCSWA 08/23/2023, 11:06 AM

## 2023-08-23 NOTE — Progress Notes (Signed)
Patient ID: Rachel Ray, female   DOB: 05-17-1981, 42 y.o.   MRN: [id number]  Patient discharged via taxi. No distress noted at the time of discharge. Patient denies SI/HI/AVH.
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
## 2023-08-23 NOTE — Transportation (Signed)
08/23/2023  Rachel Ray DOB: 1981-07-01 MRN: [id number]   RIDER WAIVER AND RELEASE OF LIABILITY  For the purposes of helping with transportation needs, [facility] partners with outside transportation providers (taxi companies, [place], Catering manager.) to give [employer] patients or other approved people the choice of on-demand rides [facility]") to our buildings for non-emergency visits.  By using [employer], I, the person signing this document, on behalf of myself and/or any legal minors (in my care using the [employer]), agree:  Science writer given to me are supplied by independent, outside transportation providers who do not work for, or have any affiliation with, [employer]. [place] is not a transportation company. [facility] has no control over the quality or safety of the rides I get using [employer]. [place] has no control over whether any outside ride will happen on time or not. [place] gives no guarantee on the reliability, quality, safety, or availability on any rides, or that no mistakes will happen. I know and accept that traveling by vehicle (car, truck, [employer], Zenaida Niece, bus, taxi, etc.) has risks of serious injuries such as disability, being paralyzed, and death. I know and agree the risk of using [employer] is mine alone, and not [employer]. Transport Services are provided "as is" and as are available. The transportation providers are in charge for all inspections and care of the vehicles used to provide these rides. I agree not to take legal action against [place], its agents, employees, officers, directors, representatives, insurers, attorneys, assigns, successors, subsidiaries, and affiliates at any time for any reasons related directly or indirectly to using [employer]. I also agree not to take legal action against [place] or its affiliates for any injury, death, or damage to property caused by or related to using  [employer]. I have read this Waiver and Release of Liability, and I understand the terms used in it and their legal meaning. This Waiver is freely and voluntarily given with the understanding that my right (or any legal minors) to legal action against [place] relating to [employer] is knowingly given up to use these services.   I attest that I read the Ride Waiver and Release of Liability to Rachel Ray, gave Ms. Alicea the opportunity to ask questions and answered the questions asked (if any). I affirm that Rachel Ray then provided consent for assistance with transportation.

## 2023-08-23 NOTE — Group Note (Signed)
Recreation Therapy Group Note   Group Topic:Coping Skills  Group Date: 08/23/2023 Start Time: 1020 End Time: 1050 Facilitators: Avonell Lenig-McCall, LRT,CTRS Location: [address] Dayroom   Group Topic: Coping Skills  Goal Area(s) Addresses:  Patient will successfully define what a coping skill is. Patient will acknowledge current strategies used in terms of healthy vs unhealthy. Patient will successfully identify benefit of using outlined coping skills post d/c.  Group Description: Mind Map. LRT and patients discussed coping skills and their importance. Patients then given a blank mind map. LRT and patients filled in the first 8 boxes together (depression, anxiety, tolerance, hopelessness, stress, poor nutrition, anger and embarrassment). Patients were to then identify at least 3 coping skills for each situation identified. LRT gave patients 15 minutes before reconvening the group to fill in their answers on the board.   Education: Pharmacologist, Scientist, physiological, Discharge Planning.   Education Outcome: Acknowledges education/In group clarification offered/Needs additional education.    Affect/Mood: Appropriate   Participation Level: Active   Participation Quality: Independent   Behavior: Appropriate   Speech/Thought Process: Focused   Insight: Moderate   Judgement: Moderate   Modes of Intervention: Worksheet   Patient Response to Interventions:  Engaged   Education Outcome:  In group clarification offered    Clinical Observations/Individualized Feedback: Pt defined a coping skill as something to help you. Pt identified some coping skills as reading the bible, exercise, take medication, journal, support system and discipline.    Plan: Continue to engage patient in RT group sessions 2-3x/week.   Rachel Ray, LRT,CTRS 08/23/2023 12:26 PM

## 2023-08-23 NOTE — Progress Notes (Signed)
[facility] LCSW Progress Note   08/23/2023    11:18 AM  Rachel Ray      Type of Note: Medicare Notice Given   Patient informed of right to appeal discharge, provided phone number to [facility]. Patient expressed no interest in appealing discharge at this time. CSW will continue to monitor situation.     Signed:   Jacob Moores, MSW, LCSWA 08/23/2023 11:18 AM

## 2023-08-23 NOTE — Discharge Summary (Signed)
Physician Discharge Summary Note  Patient:  Rachel Ray is an 42 y.o., female MRN:  [id number] DOB:  1980-12-11 Patient phone:  [phone] (home)  Patient address:   [address] [place] [address],  Total Time spent with patient: 20 minutes  Date of Admission:  08/18/2023 Date of Discharge: 08/23/2023  Reason for Admission:   Rachel Ray is a 42 yr old female who presented on 9/25 to [facility] with complaints of Auditory and Tactile Hallucinations due to stopping her medications, she was admitted to [facility] on 9/26.  PPHx is significant for Schizophrenia, Bipolar Disorder, Depression, Anxiety, PTSD, and a history of Opioid Abuse, and 4 Suicide Attempts (last-cut wrists 2017), Remote history of Self Injurious Behavior (Cutting-last early 20's), and ~10 Prior Psychiatric Hospitalizations (last- [facility] 06/2023).   She reports that she was hospitalized because of auditory and tactile hallucinations.  She reports that about 9 days ago she stopped taking her medications-Seroquel and Xanax.  She reports that she felt good and did not want to keep taking them.  She reports she "just wanted to be normal."  She reports that she poured both of them down the toilet.  She reports about 3 days ago she began hearing things hitting the floor and clicking sounds.  She reports she also felt things crawling on her head.  She reports that in the past she would go on walks to distract herself from these things but that walking no longer control the symptoms and so that is why she went to the hospital.  Principal Problem: Schizophrenia [facility]) Discharge Diagnoses: Principal Problem:   Schizophrenia (HCC) Active Problems:   Schizoaffective disorder, bipolar type (HCC)   Past Psychiatric History: Schizophrenia, Bipolar Disorder, Depression, Anxiety, PTSD, and a history of Opioid Abuse, and 4 Suicide Attempts (last-cut wrists 2017), Remote history of Self Injurious Behavior (Cutting-last early 20's), and ~10  Prior Psychiatric Hospitalizations (last- [facility] 06/2023).   Past Medical History:  Past Medical History:  Diagnosis Date   Anemia    Anxiety    Arthritis    rheumatoid   Chronic pelvic pain in female    Depression    Dysmenorrhea    Hypertension    Menorrhagia    Uterine fibroid     Past Surgical History:  Procedure Laterality Date   ABDOMINAL HYSTERECTOMY N/A 03/25/2016   Procedure: HYSTERECTOMY ABDOMINAL;  Surgeon: Lazaro Arms, MD;  Location: [facility];  Service: Gynecology;  Laterality: N/A;   ADENOIDECTOMY     CESAREAN SECTION     x 3   CHOLECYSTECTOMY     SALPINGOOPHORECTOMY Bilateral 03/25/2016   Procedure: SALPINGO OOPHORECTOMY;  Surgeon: Lazaro Arms, MD;  Location: [facility];  Service: Gynecology;  Laterality: Bilateral;   TONSILLECTOMY     TUBAL LIGATION     Family History:  Family History  Problem Relation Age of Onset   Diabetes Maternal Grandmother    Diabetes Maternal Grandfather    Cancer Other        great grandmother-breast cancer   Cancer Maternal Aunt        breast cancer   Family Psychiatric  History:  Mother- Schizophrenia Brother- ADHD Multiple Maternal Cousins- Mental Health Issues Paternal Side- Multiple Members Substance Abuse No Known Suicides  Social History:  Social History   Substance and Sexual Activity  Alcohol Use No   Alcohol/week: 0.0 standard drinks of alcohol     Social History   Substance and Sexual Activity  Drug Use No  Social History   Socioeconomic History   Marital status: Legally Separated    Spouse name: Not on file   Number of children: Not on file   Years of education: Not on file   Highest education level: Not on file  Occupational History   Not on file  Tobacco Use   Smoking status: Former    Current packs/day: 0.00    Average packs/day: 0.5 packs/day for 15.0 years (7.5 ttl pk-yrs)    Types: Cigarettes    Start date: 01/21/2005    Quit date: 01/22/2020    Years since quitting: 3.5   Smokeless  tobacco: Never  Substance and Sexual Activity   Alcohol use: No    Alcohol/week: 0.0 standard drinks of alcohol   Drug use: No   Sexual activity: Yes    Birth control/protection: Surgical  Other Topics Concern   Not on file  Social History Narrative   Not on file   Social Determinants of Health   Financial Resource Strain: Not on file  Food Insecurity: Food Insecurity Present (08/18/2023)   Hunger Vital Sign    Worried About Running Out of Food in the Last Year: Sometimes true    Ran Out of Food in the Last Year: Sometimes true  Transportation Needs: No Transportation Needs (08/18/2023)   PRAPARE - Administrator, Civil Service (Medical): No    Lack of Transportation (Non-Medical): No  Physical Activity: Not on file  Stress: Not on file  Social Connections: Not on file    Hospital Course:   During the patient's hospitalization, patient had extensive initial psychiatric evaluation, and follow-up psychiatric evaluations every day.  Psychiatric diagnoses provided upon initial assessment:  Schizoaffective Disorder, Bipolar Type and GAD   Patient's psychiatric medications were adjusted on admission: Continued Seroquel that had been restarted in the ED. Started Metformin. Started Lexapro. Started as needed Seroquel. Continued home Gabapentin. Her Xanax was not restarted.   During the hospitalization, other adjustments were made to the patient's psychiatric medication regimen: Her Seroquel was titrated during her hospitalization. Her Lexapro was stopped due to concern for mood cycling.   Patient's care was discussed during the interdisciplinary team meeting every day during the hospitalization.  The patient is not having side effects to prescribed psychiatric medication.  Gradually, patient started adjusting to milieu. The patient was evaluated each day by a clinical provider to ascertain response to treatment. Improvement was noted by the patient's report of decreasing  symptoms, improved sleep and appetite, affect, medication tolerance, behavior, and participation in unit programming.  Patient was asked each day to complete a self inventory noting mood, mental status, pain, new symptoms, anxiety and concerns.   Symptoms were reported as significantly decreased or resolved completely by discharge.  The patient reports that their mood is stable.  The patient denied having suicidal thoughts for more than 48 hours prior to discharge.  Patient denies having homicidal thoughts.  Patient denies having auditory hallucinations.  Patient denies any visual hallucinations or other symptoms of psychosis.  The patient was motivated to continue taking medication with a goal of continued improvement in mental health.   The patient reports their target psychiatric symptoms of hallucinations responded well to the psychiatric medications, and the patient reports overall benefit other psychiatric hospitalization. Supportive psychotherapy was provided to the patient. The patient also participated in regular group therapy while hospitalized. Coping skills, problem solving as well as relaxation therapies were also part of the unit programming.  Labs were reviewed  with the patient, and abnormal results were discussed with the patient.  The patient is able to verbalize their individual safety plan to this provider.  # It is recommended to the patient to continue psychiatric medications as prescribed, after discharge from the hospital.    # It is recommended to the patient to follow up with your outpatient psychiatric provider and PCP.  # It was discussed with the patient, the impact of alcohol, drugs, tobacco have been there overall psychiatric and medical wellbeing, and total abstinence from substance use was recommended the patient.ed.  # Prescriptions provided or sent directly to preferred pharmacy at discharge. Patient agreeable to plan. Given opportunity to ask questions. Appears to  feel comfortable with discharge.    # In the event of worsening symptoms, the patient is instructed to call the crisis hotline, 911 and or go to the nearest ED for appropriate evaluation and treatment of symptoms. To follow-up with primary care provider for other medical issues, concerns and or health care needs  # Patient was discharged home with a plan to follow up as noted below.    On day of discharge she reports that she is feeling good.  She reports that with the reduction of the morning dose of Seroquel she is less sedated.  She reports no other side effects with her medications.  Discussed with her that her A1c and lipid panel were elevated and that this would be something she would need to follow up with a PCP.  She reports that she does not currently have a PCP as her former had retired in July.  Discussed with her that social work would assist her in establishing with a new PCP.  Encouraged her to continue taking her medications as prescribed.  Encouraged her to make her outpatient follow-up appointments.  She reports no SI, HI, or AVH.  She reports her sleep is good.  She reports her appetite is doing good.  She reports no other concerns at present.  She was discharged home.   Physical Findings: AIMS: Facial and Oral Movements Muscles of Facial Expression: None, normal Lips and Perioral Area: None, normal Jaw: None, normal Tongue: None, normal,Extremity Movements Upper (arms, wrists, hands, fingers): None, normal Lower (legs, knees, ankles, toes): None, normal, Trunk Movements Neck, shoulders, hips: None, normal, Overall Severity Severity of abnormal movements (highest score from questions above): None, normal Incapacitation due to abnormal movements: None, normal Patient's awareness of abnormal movements (rate only patient's report): No Awareness, Dental Status Current problems with teeth and/or dentures?: No Does patient usually wear dentures?: No  CIWA:  CIWA-Ar Total:  0 COWS:     Musculoskeletal: Strength & Muscle Tone: within normal limits Gait & Station: normal Patient leans: N/A   Psychiatric Specialty Exam:  Presentation  General Appearance:  Appropriate for Environment; Casual; Fairly Groomed  Eye Contact: Good  Speech: Normal Rate; Clear and Coherent  Speech Volume: Normal  Handedness: Right   Mood and Affect  Mood: Euthymic  Affect: Appropriate; Congruent; Full Range   Thought Process  Thought Processes: Linear  Descriptions of Associations:Intact  Orientation:Full (Time, Place and Person)  Thought Content:Logical  History of Schizophrenia/Schizoaffective disorder:Yes  Duration of Psychotic Symptoms:Greater than six months  Hallucinations:Hallucinations: None  Ideas of Reference:None  Suicidal Thoughts:Suicidal Thoughts: No  Homicidal Thoughts:Homicidal Thoughts: No   Sensorium  Memory: Immediate Good; Recent Good; Remote Good  Judgment: Good  Insight: Good   Executive Functions  Concentration: Good  Attention Span: Good  Recall: Good  Fund of Knowledge: Good  Language: Good   Psychomotor Activity  Psychomotor Activity: Psychomotor Activity: Normal   Assets  Assets: Communication Skills; Desire for Improvement; Housing; Resilience   Sleep  Sleep: Sleep: Fair Number of Hours of Sleep: 6.5    Physical Exam: Physical Exam Vitals and nursing note reviewed.  Constitutional:      General: She is not in acute distress.    Appearance: Normal appearance. She is obese. She is not ill-appearing or toxic-appearing.  HENT:     Head: Normocephalic and atraumatic.  Pulmonary:     Effort: Pulmonary effort is normal.  Musculoskeletal:        General: Normal range of motion.  Neurological:     General: No focal deficit present.     Mental Status: She is alert.    Review of Systems  Respiratory:  Negative for cough and shortness of breath.   Cardiovascular:  Negative for  chest pain.  Gastrointestinal:  Negative for abdominal pain, constipation, diarrhea, nausea and vomiting.  Neurological:  Negative for dizziness, weakness and headaches.  Psychiatric/Behavioral:  Negative for depression, hallucinations and suicidal ideas. The patient is not nervous/anxious.    Blood pressure 108/71, pulse 93, temperature 98 F (36.7 C), temperature source Oral, resp. rate 13, height 5\' 2"  (1.575 m), weight 119.2 kg, last menstrual period 03/13/2016, SpO2 100%. Body mass index is 48.07 kg/m.   Social History   Tobacco Use  Smoking Status Former   Current packs/day: 0.00   Average packs/day: 0.5 packs/day for 15.0 years (7.5 ttl pk-yrs)   Types: Cigarettes   Start date: 01/21/2005   Quit date: 01/22/2020   Years since quitting: 3.5  Smokeless Tobacco Never   Tobacco Cessation:  N/A, patient does not currently use tobacco products   Blood Alcohol level:  Lab Results  Component Value Date   ETH <10 08/17/2023   ETH <10 04/22/2023    Metabolic Disorder Labs:  Lab Results  Component Value Date   HGBA1C 5.9 (H) 08/20/2023   MPG 122.63 08/20/2023   No results found for: "PROLACTIN" Lab Results  Component Value Date   CHOL 202 (H) 08/20/2023   TRIG 76 08/20/2023   HDL 57 08/20/2023   CHOLHDL 3.5 08/20/2023   VLDL 15 08/20/2023   LDLCALC 130 (H) 08/20/2023    See Psychiatric Specialty Exam and Suicide Risk Assessment completed by Attending Physician prior to discharge.  Discharge destination:  Home  Is patient on multiple antipsychotic therapies at discharge:  No   Has Patient had three or more failed trials of antipsychotic monotherapy by history:  No  Recommended Plan for Multiple Antipsychotic Therapies: NA  Discharge Instructions     Diet - low sodium heart healthy   Complete by: As directed    Increase activity slowly   Complete by: As directed       Allergies as of 08/23/2023       Reactions   Shellfish Allergy Anaphylaxis, Shortness Of  Breath, Swelling   Penicillins Itching        Medication List     STOP taking these medications    ALPRAZolam 1 MG tablet Commonly known as: XANAX   ibuprofen 200 MG tablet Commonly known as: ADVIL       TAKE these medications      Indication  cloNIDine 0.2 MG tablet Commonly known as: CATAPRES Take 1 tablet (0.2 mg total) by mouth 2 (two) times daily.  Indication: High Blood Pressure   gabapentin 100 MG  capsule Commonly known as: NEURONTIN Take 1 capsule (100 mg total) by mouth 3 (three) times daily.  Indication: Generalized Anxiety Disorder   hydrochlorothiazide 25 MG tablet Commonly known as: HYDRODIURIL Take 1 tablet (25 mg total) by mouth daily.  Indication: High Blood Pressure   hydrOXYzine 25 MG tablet Commonly known as: ATARAX Take 1 tablet (25 mg total) by mouth 3 (three) times daily as needed for anxiety.  Indication: Feeling Anxious, Feeling Tense   melatonin 3 MG Tabs tablet Take 1 tablet (3 mg total) by mouth at bedtime as needed.  Indication: Trouble Sleeping   metFORMIN 500 MG 24 hr tablet Commonly known as: GLUCOPHAGE-XR Take 1 tablet (500 mg total) by mouth daily with breakfast. Start taking on: August 24, 2023  Indication: Body Weight Gain due to Antipsychotic Medication Use   potassium chloride 10 MEQ tablet Commonly known as: KLOR-CON Take 1 tablet (10 mEq total) by mouth daily.  Indication: Low Amount of Potassium in the Blood   QUEtiapine 100 MG tablet Commonly known as: SEROQUEL Take 1 tablet (100 mg total) by mouth in the morning. What changed:  medication strength how much to take when to take this  Indication: Manic Phase of Manic-Depression   QUEtiapine 400 MG tablet Commonly known as: SEROQUEL Take 1 tablet (400 mg total) by mouth at bedtime. What changed: You were already taking a medication with the same name, and this prescription was added. Make sure you understand how and when to take each.  Indication: Manic  Phase of Manic-Depression        Follow-up Information     Services, [facility]. Go on 08/25/2023.   Why: You have a hospital follow up appointment for therapy and medication management services on 08/25/23 at 9:00 am .  This appointment will be held in person.  * Please bring photo ID, Insurance cards to this appt. Contact information: [address] [place] [address] [phone]                 Follow-up recommendations/Comments:   Activity: as tolerated   Diet: heart healthy   Other: -Follow-up with your outpatient psychiatric provider -instructions on appointment date, time, and address (location) are provided to you in discharge paperwork.   -Take your psychiatric medications as prescribed at discharge - instructions are provided to you in the discharge paperwork   -Follow-up with outpatient primary care doctor and other specialists -for management of chronic medical disease, including: Elevated A1c.  Elevated Lipid Panel.    -Testing: Follow-up with outpatient provider for abnormal lab results: Elevated A1c.  Elevated Lipid Panel.    -Recommend abstinence from alcohol, tobacco, and other illicit drug use at discharge.    -If your psychiatric symptoms recur, worsen, or if you have side effects to your psychiatric medications, call your outpatient psychiatric provider, 911, [phone] or go to the nearest emergency department.   -If suicidal thoughts recur, call your outpatient psychiatric provider, 911, [phone] or go to the nearest emergency department.     Signed: Lauro Franklin, MD 08/23/2023, 10:52 AM

## 2023-08-23 NOTE — Discharge Instructions (Signed)

## 2023-09-15 ENCOUNTER — Ambulatory Visit: Payer: 59 | Admitting: Family Medicine

## 2023-09-15 NOTE — Progress Notes (Unsigned)
   New Patient Office Visit   Subjective   Patient ID: Rachel Ray, female    DOB: [birth year]  Age: 42 y.o. MRN: [id number]  CC: No chief complaint on file.   HPI Rachel Kamper Artis42 year old female, presents to establish care. She  has a past medical history of Anemia, Anxiety, Arthritis, Chronic pelvic pain in female, Depression, Dysmenorrhea, Hypertension, Menorrhagia, and Uterine fibroid.  HPI    Outpatient Encounter Medications as of 09/15/2023  Medication Sig   cloNIDine (CATAPRES) 0.2 MG tablet Take 1 tablet (0.2 mg total) by mouth 2 (two) times daily.   gabapentin (NEURONTIN) 100 MG capsule Take 1 capsule (100 mg total) by mouth 3 (three) times daily.   hydrochlorothiazide (HYDRODIURIL) 25 MG tablet Take 1 tablet (25 mg total) by mouth daily.   hydrOXYzine (ATARAX) 25 MG tablet Take 1 tablet (25 mg total) by mouth 3 (three) times daily as needed for anxiety.   melatonin 3 MG TABS tablet Take 1 tablet (3 mg total) by mouth at bedtime as needed.   metFORMIN (GLUCOPHAGE-XR) 500 MG 24 hr tablet Take 1 tablet (500 mg total) by mouth daily with breakfast.   potassium chloride (KLOR-CON) 10 MEQ tablet Take 1 tablet (10 mEq total) by mouth daily.   QUEtiapine (SEROQUEL) 100 MG tablet Take 1 tablet (100 mg total) by mouth in the morning.   QUEtiapine (SEROQUEL) 400 MG tablet Take 1 tablet (400 mg total) by mouth at bedtime.   No facility-administered encounter medications on file as of 09/15/2023.    Past Surgical History:  Procedure Laterality Date   ABDOMINAL HYSTERECTOMY N/A 03/25/2016   Procedure: HYSTERECTOMY ABDOMINAL;  Surgeon: Lazaro Arms, MD;  Location: [facility];  Service: Gynecology;  Laterality: N/A;   ADENOIDECTOMY     CESAREAN SECTION     x 3   CHOLECYSTECTOMY     SALPINGOOPHORECTOMY Bilateral 03/25/2016   Procedure: SALPINGO OOPHORECTOMY;  Surgeon: Lazaro Arms, MD;  Location: [facility];  Service: Gynecology;  Laterality: Bilateral;   TONSILLECTOMY     TUBAL LIGATION       ROS    Objective    LMP 03/13/2016 (Exact Date)   Physical Exam    Assessment & Plan:  There are no diagnoses linked to this encounter.  No follow-ups on file.   Rachel Lederer Newman Nip, FNP

## 2023-09-15 NOTE — Patient Instructions (Signed)

## 2023-09-16 ENCOUNTER — Encounter: Payer: Self-pay | Admitting: Family Medicine

## 2024-02-08 ENCOUNTER — Ambulatory Visit (HOSPITAL_COMMUNITY): Admission: EM | Admit: 2024-02-08 | Discharge: 2024-02-08 | Discharge status: Against Medical Advice
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
## 2024-07-14 ENCOUNTER — Encounter: Payer: Self-pay | Admitting: Radiology

## 2024-07-24 ENCOUNTER — Encounter (HOSPITAL_COMMUNITY): Payer: Self-pay | Admitting: Emergency Medicine

## 2024-07-24 ENCOUNTER — Inpatient Hospital Stay (HOSPITAL_COMMUNITY)
Admission: EM | Admit: 2024-07-24 | Discharge: 2024-07-27 | DRG: 558 | Attending: Family Medicine | Admitting: Family Medicine

## 2024-07-24 ENCOUNTER — Other Ambulatory Visit: Payer: Self-pay

## 2024-07-24 DIAGNOSIS — Z91148 Patient's other noncompliance with medication regimen for other reason: Secondary | ICD-10-CM

## 2024-07-24 DIAGNOSIS — E876 Hypokalemia: Secondary | ICD-10-CM | POA: Diagnosis present

## 2024-07-24 DIAGNOSIS — Z560 Unemployment, unspecified: Secondary | ICD-10-CM

## 2024-07-24 DIAGNOSIS — Z9049 Acquired absence of other specified parts of digestive tract: Secondary | ICD-10-CM

## 2024-07-24 DIAGNOSIS — G8929 Other chronic pain: Secondary | ICD-10-CM | POA: Diagnosis present

## 2024-07-24 DIAGNOSIS — R440 Auditory hallucinations: Secondary | ICD-10-CM | POA: Diagnosis not present

## 2024-07-24 DIAGNOSIS — G4700 Insomnia, unspecified: Secondary | ICD-10-CM | POA: Diagnosis present

## 2024-07-24 DIAGNOSIS — Z880 Allergy status to penicillin: Secondary | ICD-10-CM

## 2024-07-24 DIAGNOSIS — Z833 Family history of diabetes mellitus: Secondary | ICD-10-CM

## 2024-07-24 DIAGNOSIS — Z90710 Acquired absence of both cervix and uterus: Secondary | ICD-10-CM

## 2024-07-24 DIAGNOSIS — Z91013 Allergy to seafood: Secondary | ICD-10-CM

## 2024-07-24 DIAGNOSIS — M6282 Rhabdomyolysis: Principal | ICD-10-CM | POA: Diagnosis present

## 2024-07-24 DIAGNOSIS — F259 Schizoaffective disorder, unspecified: Secondary | ICD-10-CM | POA: Diagnosis not present

## 2024-07-24 DIAGNOSIS — M1990 Unspecified osteoarthritis, unspecified site: Secondary | ICD-10-CM | POA: Diagnosis present

## 2024-07-24 DIAGNOSIS — I10 Essential (primary) hypertension: Secondary | ICD-10-CM | POA: Diagnosis present

## 2024-07-24 DIAGNOSIS — F419 Anxiety disorder, unspecified: Secondary | ICD-10-CM | POA: Diagnosis present

## 2024-07-24 DIAGNOSIS — Z87891 Personal history of nicotine dependence: Secondary | ICD-10-CM

## 2024-07-24 DIAGNOSIS — Z7984 Long term (current) use of oral hypoglycemic drugs: Secondary | ICD-10-CM

## 2024-07-24 DIAGNOSIS — Z803 Family history of malignant neoplasm of breast: Secondary | ICD-10-CM

## 2024-07-24 DIAGNOSIS — Z809 Family history of malignant neoplasm, unspecified: Secondary | ICD-10-CM

## 2024-07-24 DIAGNOSIS — G629 Polyneuropathy, unspecified: Secondary | ICD-10-CM | POA: Diagnosis present

## 2024-07-24 DIAGNOSIS — M069 Rheumatoid arthritis, unspecified: Secondary | ICD-10-CM | POA: Diagnosis present

## 2024-07-24 DIAGNOSIS — F32A Depression, unspecified: Secondary | ICD-10-CM | POA: Diagnosis present

## 2024-07-24 DIAGNOSIS — Z79899 Other long term (current) drug therapy: Secondary | ICD-10-CM

## 2024-07-24 DIAGNOSIS — F209 Schizophrenia, unspecified: Secondary | ICD-10-CM | POA: Diagnosis present

## 2024-07-24 DIAGNOSIS — Z6841 Body Mass Index (BMI) 40.0 and over, adult: Secondary | ICD-10-CM

## 2024-07-24 HISTORY — DX: Schizophrenia, unspecified: F20.9

## 2024-07-24 LAB — RAPID URINE DRUG SCREEN, HOSP PERFORMED
Amphetamines: NOT DETECTED
Barbiturates: NOT DETECTED
Benzodiazepines: POSITIVE — AB
Cocaine: NOT DETECTED
Opiates: NOT DETECTED
Tetrahydrocannabinol: NOT DETECTED

## 2024-07-24 LAB — CBC
HCT: 38.6 % (ref 36.0–46.0)
Hemoglobin: 13 g/dL (ref 12.0–15.0)
MCH: 31 pg (ref 26.0–34.0)
MCHC: 33.7 g/dL (ref 30.0–36.0)
MCV: 92.1 fL (ref 80.0–100.0)
Platelets: 227 K/uL (ref 150–400)
RBC: 4.19 MIL/uL (ref 3.87–5.11)
RDW: 12.3 % (ref 11.5–15.5)
WBC: 7.1 K/uL (ref 4.0–10.5)
nRBC: 0 % (ref 0.0–0.2)

## 2024-07-24 LAB — COMPREHENSIVE METABOLIC PANEL WITH GFR
ALT: 53 U/L — ABNORMAL HIGH (ref 0–44)
AST: 102 U/L — ABNORMAL HIGH (ref 15–41)
Albumin: 3.9 g/dL (ref 3.5–5.0)
Alkaline Phosphatase: 62 U/L (ref 38–126)
Anion gap: 13 (ref 5–15)
BUN: 10 mg/dL (ref 6–20)
CO2: 26 mmol/L (ref 22–32)
Calcium: 9.3 mg/dL (ref 8.9–10.3)
Chloride: 101 mmol/L (ref 98–111)
Creatinine, Ser: 0.78 mg/dL (ref 0.44–1.00)
GFR, Estimated: >60 mL/min (ref >60)
Glucose, Bld: 97 mg/dL (ref 70–99)
Potassium: 3.1 mmol/L — ABNORMAL LOW (ref 3.5–5.1)
Sodium: 140 mmol/L (ref 135–145)
Total Bilirubin: 0.9 mg/dL (ref 0.0–1.2)
Total Protein: 8 g/dL (ref 6.5–8.1)

## 2024-07-24 LAB — CK
Total CK: 4855 U/L — ABNORMAL HIGH (ref 38–234)
Total CK: 5230 U/L — ABNORMAL HIGH (ref 38–234)
Total CK: 5682 U/L — ABNORMAL HIGH (ref 38–234)

## 2024-07-24 LAB — TSH: TSH: 1.43 u[IU]/mL (ref 0.350–4.500)

## 2024-07-24 LAB — ETHANOL: Alcohol, Ethyl (B): <15 mg/dL (ref <15)

## 2024-07-24 MED ORDER — ENOXAPARIN SODIUM 60 MG/0.6ML IJ SOSY
60.0000 mg | PREFILLED_SYRINGE | INTRAMUSCULAR | Status: DC
  Administered 2024-07-24: 60 mg via SUBCUTANEOUS
  Filled 2024-07-24 (×2): qty 0.6

## 2024-07-24 MED ORDER — ACETAMINOPHEN 325 MG PO TABS: 650.0000 mg | ORAL_TABLET | Freq: Four times a day (QID) | ORAL | Status: DC | PRN

## 2024-07-24 MED ORDER — ORAL CARE MOUTH RINSE: 15.0000 mL | OROMUCOSAL | Status: DC | PRN

## 2024-07-24 MED ORDER — SODIUM CHLORIDE 0.9 % IV BOLUS
1000.0000 mL | Freq: Once | INTRAVENOUS | Status: AC
  Administered 2024-07-24: 1000 mL via INTRAVENOUS

## 2024-07-24 MED ORDER — ENOXAPARIN SODIUM 40 MG/0.4ML IJ SOSY: 40.0000 mg | PREFILLED_SYRINGE | INTRAMUSCULAR | Status: DC

## 2024-07-24 MED ORDER — ONDANSETRON 4 MG PO TBDP: 8.0000 mg | ORAL_TABLET | Freq: Three times a day (TID) | ORAL | Status: DC | PRN

## 2024-07-24 MED ORDER — SODIUM CHLORIDE 0.9 % IV SOLN: Freq: Once | INTRAVENOUS | Status: DC

## 2024-07-24 MED ORDER — MAGNESIUM SULFATE IN D5W 1-5 GM/100ML-% IV SOLN
1.0000 g | Freq: Once | INTRAVENOUS | Status: AC
  Administered 2024-07-24: 1 g via INTRAVENOUS
  Filled 2024-07-24: qty 100

## 2024-07-24 MED ORDER — OXYCODONE HCL 5 MG PO TABS: 5.0000 mg | ORAL_TABLET | Freq: Four times a day (QID) | ORAL | Status: DC | PRN

## 2024-07-24 MED ORDER — QUETIAPINE FUMARATE 100 MG PO TABS
100.0000 mg | ORAL_TABLET | Freq: Every morning | ORAL | Status: DC
  Administered 2024-07-24 – 2024-07-27 (×4): 100 mg via ORAL
  Filled 2024-07-24 (×5): qty 1

## 2024-07-24 MED ORDER — LORAZEPAM 1 MG PO TABS: 1.0000 mg | ORAL_TABLET | Freq: Once | ORAL | Status: DC

## 2024-07-24 MED ORDER — CLONIDINE HCL 0.2 MG PO TABS
0.2000 mg | ORAL_TABLET | Freq: Two times a day (BID) | ORAL | Status: DC
  Administered 2024-07-24: 0.2 mg via ORAL
  Filled 2024-07-24: qty 1

## 2024-07-24 MED ORDER — ACETAMINOPHEN 325 MG PO TABS
650.0000 mg | ORAL_TABLET | Freq: Four times a day (QID) | ORAL | Status: DC | PRN
  Administered 2024-07-24 – 2024-07-25 (×4): 650 mg via ORAL
  Filled 2024-07-24 (×4): qty 2

## 2024-07-24 MED ORDER — GABAPENTIN 100 MG PO CAPS
100.0000 mg | ORAL_CAPSULE | Freq: Three times a day (TID) | ORAL | Status: DC
  Administered 2024-07-24 – 2024-07-27 (×8): 100 mg via ORAL
  Filled 2024-07-24 (×9): qty 1

## 2024-07-24 MED ORDER — MELATONIN 3 MG PO TABS
3.0000 mg | ORAL_TABLET | Freq: Every day | ORAL | Status: DC
  Administered 2024-07-24 – 2024-07-25 (×2): 3 mg via ORAL
  Filled 2024-07-24 (×2): qty 1

## 2024-07-24 MED ORDER — QUETIAPINE FUMARATE 100 MG PO TABS
400.0000 mg | ORAL_TABLET | Freq: Every day | ORAL | Status: DC
  Administered 2024-07-25 – 2024-07-26 (×2): 400 mg via ORAL
  Filled 2024-07-24 (×2): qty 4

## 2024-07-24 MED ORDER — HYDROXYZINE HCL 25 MG PO TABS
25.0000 mg | ORAL_TABLET | Freq: Three times a day (TID) | ORAL | Status: DC | PRN
  Administered 2024-07-24 – 2024-07-26 (×5): 25 mg via ORAL
  Filled 2024-07-24 (×5): qty 1

## 2024-07-24 MED ORDER — FUROSEMIDE 10 MG/ML IJ SOLN
40.0000 mg | Freq: Once | INTRAMUSCULAR | Status: AC
  Administered 2024-07-24: 40 mg via INTRAVENOUS
  Filled 2024-07-24: qty 4

## 2024-07-24 MED ORDER — POTASSIUM CHLORIDE CRYS ER 20 MEQ PO TBCR
40.0000 meq | EXTENDED_RELEASE_TABLET | Freq: Once | ORAL | Status: AC
  Administered 2024-07-24: 40 meq via ORAL
  Filled 2024-07-24: qty 2

## 2024-07-24 MED ORDER — HYDROMORPHONE HCL 1 MG/ML IJ SOLN: 1.0000 mg | INTRAMUSCULAR | Status: DC | PRN

## 2024-07-24 MED ORDER — ACETAMINOPHEN 650 MG RE SUPP: 650.0000 mg | Freq: Four times a day (QID) | RECTAL | Status: DC | PRN

## 2024-07-24 MED ORDER — SODIUM CHLORIDE 0.9 % IV SOLN: INTRAVENOUS | Status: DC

## 2024-07-24 NOTE — ED Provider Notes (Signed)
 Addendum to previous note.  Patient's total CK has been found to be over 4000 Patient had reported muscle aches. Denies any recent falls or trauma.  She denies any cocaine use  Patient will need 2 L of IV fluid and recheck of her total CK.  If her CK is improving, then behavioral health can be consulted again.  Patient will be signed out to oncoming provider at 7 AM today   Midge Golas, MD 07/24/24 [phone]

## 2024-07-24 NOTE — BH Assessment (Signed)
 Patient was deferred to [facility] for a telepsych assessment. The assigned care coordinator will provide updates regarding the scheduling of the assessment. [facility]IS coordinator can be reached at [phone] for further information on the timing of the telepsych evaluation.

## 2024-07-24 NOTE — ED Triage Notes (Signed)
 Pt bib EMS after she states she has a hx of schizophrenia and feels like she may be getting close to having another episode. Reports recent anxiety and depression but denies SI. States she has not slept in 4 days.

## 2024-07-24 NOTE — ED Provider Notes (Signed)
 [place] EMERGENCY DEPARTMENT AT [facility] Provider Note   CSN: [id number] Arrival date & time: 07/24/24  0404     Patient presents with: Medical Clearance   Rachel Ray is a 43 y.o. female.   The history is provided by the patient.  Patient with history of schizophrenia presents for evaluation.  Patient reports that for up to 3 days she has had very little sleep.  She reports increased anxiety.  She reports she has not taken her Seroquel  in at least 2-3 days.  She denies any SI, but does report auditory hallucinations.  No fevers or vomiting  Patient reports diffuse body pain.  No fevers, but she does report nausea and vomiting.  No chest or abdominal pain.  No significant headache    Past Medical History:  Diagnosis Date   Anemia    Anxiety    Arthritis    rheumatoid   Chronic pelvic pain in female    Depression    Dysmenorrhea    Hypertension    Menorrhagia    Schizophrenia (HCC)    Uterine fibroid     Prior to Admission medications   Medication Sig Start Date End Date Taking? Authorizing Provider  cloNIDine  (CATAPRES ) 0.2 MG tablet Take 1 tablet (0.2 mg total) by mouth 2 (two) times daily. 08/23/23 09/22/23  Massengill, Rankin, MD  gabapentin  (NEURONTIN ) 100 MG capsule Take 1 capsule (100 mg total) by mouth 3 (three) times daily. 08/23/23 09/22/23  Massengill, Rankin, MD  hydrochlorothiazide  (HYDRODIURIL ) 25 MG tablet Take 1 tablet (25 mg total) by mouth daily. 08/23/23 09/22/23  Massengill, Rankin, MD  hydrOXYzine  (ATARAX ) 25 MG tablet Take 1 tablet (25 mg total) by mouth 3 (three) times daily as needed for anxiety. 08/23/23   Massengill, Rankin, MD  melatonin 3 MG TABS tablet Take 1 tablet (3 mg total) by mouth at bedtime as needed. 08/23/23   Massengill, Rankin, MD  metFORMIN  (GLUCOPHAGE -XR) 500 MG 24 hr tablet Take 1 tablet (500 mg total) by mouth daily with breakfast. 08/24/23 09/23/23  Massengill, Rankin, MD  QUEtiapine  (SEROQUEL ) 100 MG tablet Take 1  tablet (100 mg total) by mouth in the morning. 08/23/23 09/22/23  Massengill, Rankin, MD  QUEtiapine  (SEROQUEL ) 400 MG tablet Take 1 tablet (400 mg total) by mouth at bedtime. 08/23/23 09/22/23  Johny Rankin, MD    Allergies: Shellfish allergy and Penicillins    Review of Systems  Constitutional:  Negative for fever.  Cardiovascular:  Negative for chest pain.  Gastrointestinal:  Positive for nausea and vomiting. Negative for abdominal pain.  Neurological:  Negative for headaches.  Psychiatric/Behavioral:  Positive for hallucinations and sleep disturbance. Negative for suicidal ideas. The patient is nervous/anxious.     Updated Vital Signs BP 128/81 (BP Location: Left Arm)   Pulse 83   Temp 99.4 F (37.4 C)   Resp 18   Ht 1.575 m (5' 2)   Wt 119 kg   LMP 03/13/2016 (Exact Date)   SpO2 97%   BMI 47.98 kg/m   Physical Exam CONSTITUTIONAL: Well developed/well nourished HEAD: Normocephalic/atraumatic EYES: EOMI/PERRL, no nystagmus ENMT: Mucous membranes moist NECK: supple no meningeal signs CV: S1/S2 noted, no murmurs/rubs/gallops noted LUNGS: Lungs are clear to auscultation bilaterally, no apparent distress ABDOMEN: soft, nontender NEURO: Pt is awake/alert/appropriate, moves all extremitiesx4.  No facial droop.   EXTREMITIES: pulses normal/equal, full ROM SKIN: warm, color normal PSYCH: Flat affect  (all labs ordered are listed, but only abnormal results are displayed) Labs Reviewed  COMPREHENSIVE METABOLIC PANEL WITH GFR - Abnormal; Notable for the following components:      Result Value   Potassium 3.1 (*)    AST 102 (*)    ALT 53 (*)    All other components within normal limits  RAPID URINE DRUG SCREEN, HOSP PERFORMED - Abnormal; Notable for the following components:   Benzodiazepines POSITIVE (*)    All other components within normal limits  ETHANOL  CBC  CK    EKG: None  Radiology: No results found.   Procedures   Medications Ordered in the ED   hydrOXYzine  (ATARAX ) tablet 25 mg (has no administration in time range)  QUEtiapine  (SEROQUEL ) tablet 100 mg (has no administration in time range)  acetaminophen  (TYLENOL ) tablet 650 mg (has no administration in time range)  ondansetron  (ZOFRAN -ODT) disintegrating tablet 8 mg (has no administration in time range)  potassium chloride  SA (KLOR-CON  M) CR tablet 40 mEq (has no administration in time range)                                    Medical Decision Making Amount and/or Complexity of Data Reviewed Labs: ordered.  Risk OTC drugs. Prescription drug management.  5:33 AM Patient with documented history of schizophrenia presents with auditory hallucinations.  Patient reports that she has had a recent nonadherence to her medications and is now having decreased sleep for the past several days and hearing voices.  She denies any suicidal ideation Patient is calm, but does have a flat affect but makes good eye contact.  She is not agitated Labs are overall reassuring except for very mild hypokalemia.  She is medically stable.  She has no significant pain complaints.  No change in mental status  The patient has been placed in psychiatric observation due to the need to provide a safe environment for the patient while obtaining psychiatric consultation and evaluation, as well as ongoing medical and medication management to treat the patient's condition.  The patient has not been placed under full IVC at this time.      Final diagnoses:  Auditory hallucinations    ED Discharge Orders     None          Midge Golas, MD 07/24/24 [phone]

## 2024-07-24 NOTE — Consult Note (Signed)
 [facility] Psychiatric Consult Initial  Patient Name: .Rachel Ray  MRN: [id number]  DOB: 08/29/1981  Consult Order details:  Orders (From admission, onward)     Start     Ordered   07/24/24 0531  CONSULT TO CALL ACT TEAM       Ordering Provider: Midge Golas, MD  Provider:  (Not yet assigned)  Question:  Reason for Consult?  Answer:  Psych consult   07/24/24 0531             Mode of Visit: In person    Psychiatry Consult Evaluation  Service Date: July 24, 2024 LOS:  LOS: 0 days  Chief Complaint Patient presented to the ED voluntarily, expressing concern that she may be close to having another psychotic episode. She reports a history of schizophrenia and describes increased anxiety, poor sleep for the past 3 days, and auditory hallucinations. Reports medication nonadherence (missed Seroquel  for 2-3 days).  Primary Psychiatric Diagnoses  Schizophrenia 2.   Anxiety  Assessment  Rachel Ray is a 43 y.o. female admitted: Presented to the EDfor 07/24/2024  4:05 AM for expressing concern that she may be close to having another psychotic episode. She reports a history of schizophrenia and describes increased anxiety, poor sleep for the past 3 days, and auditory hallucinations. Reports medication nonadherence (missed Seroquel  for 2-3 days). She carries the psychiatric diagnoses of because of hernia and anxiety and has a past medical history of rhabdomyolysis, chronic pain of right knee, hysterectomy and dysmenorrhea.    43 year old female with schizophrenia, moderate depression, and recent medication nonadherence presents with poor sleep, anxiety, and intermittent auditory hallucinations. Although she currently denies SI/HI/AVH, her recent decompensation, history of prior inpatient admission, poor adherence, and recognition of early relapse symptoms (hyperreligiosity, AH, insomnia) place her at high risk for further deterioration if discharged prematurely. Current medical  hospitalization for rhabdomyolysis further complicates her case. Please see plan below for detailed recommendations.   Diagnoses:  Active Hospital problems: Principal Problem:   Rhabdomyolysis Active Problems:   Schizophrenia (HCC)   Auditory hallucinations    Plan   ## Psychiatric Medication Recommendations:  Room home medications  ## Medical Decision Making Capacity: Not specifically addressed in this encounter  ## Further Work-up:  -- No further workup needed at this time EKG, While pt on Qtc prolonging medications, please monitor & replete K+ to 4 and Mg2+ to 2, or UDS -- Updated EKG ordered on 07/24/2024 -- Pertinent labwork reviewed earlier this admission includes: CBC, CK, CMP, EKG, UDS   ## Disposition:-- We recommend inpatient psychiatric hospitalization when medically cleared. Patient is under voluntary admission status at this time; please IVC if attempts to leave hospital.  ## Behavioral / Environmental: -To minimize splitting of staff, assign one staff person to communicate all information from the team when feasible. or Utilize compassion and acknowledge the patient's experiences while setting clear and realistic expectations for care.    ## Safety and Observation Level:  - Based on my clinical evaluation, I estimate the patient to be at low risk of self harm in the current setting. - At this time, we recommend  routine. This decision is based on my review of the chart including patient's history and current presentation, interview of the patient, mental status examination, and consideration of suicide risk including evaluating suicidal ideation, plan, intent, suicidal or self-harm behaviors, risk factors, and protective factors. This judgment is based on our ability to directly address suicide risk, implement suicide prevention strategies, and develop  a safety plan while the patient is in the clinical setting. Please contact our team if there is a concern that risk level  has changed.  CSSR Risk Category:C-SSRS RISK CATEGORY: No Risk  Suicide Risk Assessment: Patient has following modifiable risk factors for suicide: untreated depression and active mental illness (to encompass adhd, tbi, mania, psychosis, trauma reaction), which we are addressing by recommending inpatient psychiatric admission. Patient has following non-modifiable or demographic risk factors for suicide: psychiatric hospitalization Patient has the following protective factors against suicide: Access to outpatient mental health care and Supportive friends  Thank you for this consult request. Recommendations have been communicated to the primary team.  We will continue to follow patient at this time.   Pratham Cassatt MOTLEY-MANGRUM, PMHNP       History of Present Illness  Relevant Aspects of Hospital ED Course:  Admitted on 07/24/2024 for expressing concern that she may be close to having another psychotic episode. She reports a history of schizophrenia and describes increased anxiety, poor sleep for the past 3 days, and auditory hallucinations. Reports medication nonadherence (missed Seroquel  for 2-3 days).  Patient Report:  Rachel Ray, 43 y.o., female patient seen face to face by this provider, consulted with Dr. Larina; and chart reviewed on 07/24/24.  On evaluation Rachel Ray reports intermittent auditory hallucinations, particularly when off medications ("I hear noises"). Denies current suicidal ideation (SI), homicidal ideation (HI), or active hallucinations at time of evaluation, but acknowledges recent exacerbation of symptoms. Recognizes that stopping Seroquel  "throws me off" and places her in a hyperreligious state. States she is on disability, unemployed, and lives with her 89 year old son. Reports outpatient psychiatric providers at [facility]. Reports she did not tolerate Lexapro . PHQ-9 score: 12 (moderate depression).  Stating that she has little interest or  pleasure in doing things, feels down, depressed and hopeless, trouble falling asleep, feeling tired and have little energy, poor appetite, feels that she is a failure, trouble concentrating on things, and has been moving very slowly (states her pastor pointed this out to her) . 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  During evaluation Rachel Ray is Appearance: Casually dressed, cooperative. Behavior: Calm, engaged. Mood/Affect: Reports "better," affect appropriate but mildly anxious. Speech: Clear, coherent. Thought Process: Linear, logical. Thought Content: Denies SI/HI; admits to intermittent AH when nonadherent with medications. No overt delusions elicited. Perception: Not currently responding to internal stimuli. Orientation: Alert and oriented 4. Insight/Judgment: Fair--recognizes link between medication adherence and psychiatric stability.    Psych ROS:  Depression: Endorses Anxiety: Endorses Mania (lifetime and current): Denies Psychosis: (lifetime and current): Denies  Collateral information:  Contacted none  Review of Systems  Psychiatric/Behavioral:  Positive for depression.      Psychiatric and Social History  Psychiatric History:  Information collected from patient and chart review  Prev Dx/Sx: Schizophrenia Current Psych Provider: Yes Home Meds (current): Yes Previous Med Trials: Yes Therapy: No  Prior Psych Hospitalization: Yes Prior Self Harm: Denies Prior Violence: Denies  Family Psych History: Denies Family Hx suicide: Denies  Social History:  Developmental Hx: Deferred Educational Hx: Graduated high school Occupational Hx: Unemployed Legal Hx: Denies Living Situation: Lives with her son Spiritual Hx: Yes Access to weapons/lethal means: Denies  Substance History Alcohol: Denies Type of alcohol denies Last Drink denies Illicit drugs: Denies currently, past history of pain medication abuse Rehab hx: Yes  Exam  Findings  Physical Exam:  Vital Signs:  Temp:  [97.9  F (36.6 C)-99.4 F (37.4 C)] 98.4 F (36.9 C) (09/01 1743) Pulse Rate:  [62-102] 71 (09/01 1743) Resp:  [16-18] 18 (09/01 1743) BP: (103-132)/(58-81) 107/72 (09/01 1743) SpO2:  [94 %-100 %] 100 % (09/01 1743) Weight:  [119 kg-124.5 kg] 124.5 kg (09/01 1200) Blood pressure 107/72, pulse 71, temperature 98.4 F (36.9 C), temperature source Oral, resp. rate 18, height 5' 2 (1.575 m), weight 124.5 kg, last menstrual period 03/13/2016, SpO2 100%. Body mass index is 50.2 kg/m.  Physical Exam Vitals and nursing note reviewed. Exam conducted with a chaperone present.  Neurological:     Mental Status: She is alert.  Psychiatric:        Attention and Perception: Attention normal.        Mood and Affect: Mood is depressed.        Speech: Speech normal.        Behavior: Behavior is cooperative.        Thought Content: Thought content includes suicidal ideation.        Cognition and Memory: Cognition is impaired.        Judgment: Judgment is inappropriate.     Mental Status Exam: General Appearance: Casual  Orientation:  Full (Time, Place, and Person)  Memory:  Immediate;   Fair  Concentration:  Concentration: Fair  Recall:  Fair  Attention  Fair  Eye Contact:  Fair  Speech:  Clear and Coherent  Language:  Fair  Volume:  Normal  Mood: Reports "better," affect appropriate but mildly anxious.  Affect:  Appropriate  Thought Process:  Linear  Thought Content:  Denies SI/HI; admits to intermittent AH when nonadherent with medications. No overt delusions elicited.  Suicidal Thoughts:  No  Homicidal Thoughts:  No  Judgement:  Intact  Insight:  Fair  Psychomotor Activity:  Normal  Akathisia:  No  Fund of Knowledge:  Fair      Assets:  Manufacturing systems engineer Desire for Improvement Financial Resources/Insurance Housing Social Support  Cognition:  WNL  ADL's:  Intact  AIMS (if indicated):        Other History   These have  been pulled in through the EMR, reviewed, and updated if appropriate.  Family History:  The patient's family history includes Cancer in her maternal aunt and another family member; Diabetes in her maternal grandfather and maternal grandmother.  Medical History: Past Medical History:  Diagnosis Date   Anemia    Anxiety    Arthritis    rheumatoid   Chronic pelvic pain in female    Depression    Dysmenorrhea    Hypertension    Menorrhagia    Schizophrenia (HCC)    Uterine fibroid     Surgical History: Past Surgical History:  Procedure Laterality Date   ABDOMINAL HYSTERECTOMY N/A 03/25/2016   Procedure: HYSTERECTOMY ABDOMINAL;  Surgeon: Vonn VEAR Inch, MD;  Location: [facility];  Service: Gynecology;  Laterality: N/A;   ADENOIDECTOMY     CESAREAN SECTION     x 3   CHOLECYSTECTOMY     SALPINGOOPHORECTOMY Bilateral 03/25/2016   Procedure: SALPINGO OOPHORECTOMY;  Surgeon: Vonn VEAR Inch, MD;  Location: [facility];  Service: Gynecology;  Laterality: Bilateral;   TONSILLECTOMY     TUBAL LIGATION       Medications:   Current Facility-Administered Medications:    0.9 %  sodium chloride  infusion, , Intravenous, Once, Francesca Elsie CROME, MD, Held at 07/24/24 1206   0.9 %  sodium chloride  infusion, , Intravenous, Continuous, Ricky Fines, MD, Last  Rate: 150 mL/hr at 07/24/24 1206, New Bag at 07/24/24 1206   acetaminophen  (TYLENOL ) tablet 650 mg, 650 mg, Oral, Q6H PRN, 650 mg at 07/24/24 1220 **OR** acetaminophen  (TYLENOL ) suppository 650 mg, 650 mg, Rectal, Q6H PRN, Ricky Fines, MD   cloNIDine  (CATAPRES ) tablet 0.2 mg, 0.2 mg, Oral, BID, Ricky Fines, MD   enoxaparin  (LOVENOX ) injection 60 mg, 60 mg, Subcutaneous, Q24H, Ricky Fines, MD   gabapentin  (NEURONTIN ) capsule 100 mg, 100 mg, Oral, TID, Ricky Fines, MD, 100 mg at 07/24/24 1454   HYDROmorphone  (DILAUDID ) injection 1 mg, 1 mg, Intravenous, Q4H PRN, Ricky Fines, MD   hydrOXYzine  (ATARAX ) tablet 25 mg, 25 mg, Oral, TID PRN,  Midge Golas, MD, 25 mg at 07/24/24 0749   melatonin tablet 3 mg, 3 mg, Oral, QHS, Ricky Fines, MD   ondansetron  (ZOFRAN -ODT) disintegrating tablet 8 mg, 8 mg, Oral, Q8H PRN, Midge Golas, MD   Oral care mouth rinse, 15 mL, Mouth Rinse, PRN, Ricky Fines, MD   oxyCODONE  (Oxy IR/ROXICODONE ) immediate release tablet 5 mg, 5 mg, Oral, Q6H PRN, Ricky Fines, MD   QUEtiapine  (SEROQUEL ) tablet 100 mg, 100 mg, Oral, q AM, Midge Golas, MD, 100 mg at 07/24/24 0627   [START ON 07/25/2024] QUEtiapine  (SEROQUEL ) tablet 400 mg, 400 mg, Oral, QHS, Ricky Fines, MD  Allergies: Allergies  Allergen Reactions   Shellfish Allergy Anaphylaxis, Shortness Of Breath and Swelling   Penicillins Itching    Luiza Carranco MOTLEY-MANGRUM, PMHNP

## 2024-07-24 NOTE — ED Provider Notes (Signed)
  ED Course / MDM   Clinical Course as of 07/24/24 1130  Mon Jul 24, 2024  1129 Received sign out from Dr. Midge pending repeat CK and fluids. Repeat CK after fluids is uptrending. Discussed with Dr. Gayle who will admit paitent. Given uptrending CK not cleared for psychiatric admission  [WS]    Clinical Course User Index [WS] Francesca Elsie CROME, MD   Medical Decision Making Amount and/or Complexity of Data Reviewed Labs: ordered.  Risk OTC drugs. Prescription drug management. Decision regarding hospitalization.      Francesca Elsie CROME, MD 07/24/24 1130

## 2024-07-24 NOTE — H&P (Signed)
 History and Physical    Patient: Rachel Ray FMW:[id number] DOB: 03/13/1981 DOA: 07/24/2024 DOS: the patient was seen and examined on 07/24/2024  PCP: Patient, No Pcp Per   Patient coming from: Home  Chief Complaint:  Chief Complaint  Patient presents with   Medical Clearance   HPI: Rachel Ray is a 43 y.o. female with medical history significant of schizophrenia, hypertension, insomnia, depression/anxiety and osteoarthritis; who presented to the hospital secondary to auditory hallucinations. Patient expressed medication noncompliance and for the last 3-4 days has been experiencing increase difficulty sleeping and auditory hallucinations.  No suicidal ideation, no fever, no chest pain, no shortness of breath, no nausea, no vomiting.  Of note, patient expressed son generalized muscle ache and joint discomfort.  Workup in the ED demonstrating nontraumatic rhabdomyolysis; otherwise stable blood work and vital signs.  Encouraged to have patient clear for psychiatry intervention and if needed discharge to inpatient psych facility for further management of her schizophrenia patients will require stabilization of ongoing rhabdomyolysis process.  [facility] has been contacted to place patient in the hospital for fluid resuscitation and further management.  Review of Systems: As mentioned in the history of present illness. All other systems reviewed and are negative. Past Medical History:  Diagnosis Date   Anemia    Anxiety    Arthritis    rheumatoid   Chronic pelvic pain in female    Depression    Dysmenorrhea    Hypertension    Menorrhagia    Schizophrenia (HCC)    Uterine fibroid    Past Surgical History:  Procedure Laterality Date   ABDOMINAL HYSTERECTOMY N/A 03/25/2016   Procedure: HYSTERECTOMY ABDOMINAL;  Surgeon: Vonn VEAR Inch, MD;  Location: [facility];  Service: Gynecology;  Laterality: N/A;   ADENOIDECTOMY     CESAREAN SECTION     x 3   CHOLECYSTECTOMY     SALPINGOOPHORECTOMY  Bilateral 03/25/2016   Procedure: SALPINGO OOPHORECTOMY;  Surgeon: Vonn VEAR Inch, MD;  Location: [facility];  Service: Gynecology;  Laterality: Bilateral;   TONSILLECTOMY     TUBAL LIGATION     Social History:  reports that she quit smoking about 4 years ago. Her smoking use included cigarettes. She started smoking about 19 years ago. She has a 7.5 pack-year smoking history. She has never used smokeless tobacco. She reports that she does not drink alcohol and does not use drugs.  Allergies  Allergen Reactions   Shellfish Allergy Anaphylaxis, Shortness Of Breath and Swelling   Penicillins Itching    Family History  Problem Relation Age of Onset   Diabetes Maternal Grandmother    Diabetes Maternal Grandfather    Cancer Other        great grandmother-breast cancer   Cancer Maternal Aunt        breast cancer    Prior to Admission medications   Medication Sig Start Date End Date Taking? Authorizing Provider  cloNIDine  (CATAPRES ) 0.2 MG tablet Take 1 tablet (0.2 mg total) by mouth 2 (two) times daily. 08/23/23 09/22/23  Massengill, Rankin, MD  gabapentin  (NEURONTIN ) 100 MG capsule Take 1 capsule (100 mg total) by mouth 3 (three) times daily. 08/23/23 09/22/23  Massengill, Rankin, MD  hydrochlorothiazide  (HYDRODIURIL ) 25 MG tablet Take 1 tablet (25 mg total) by mouth daily. 08/23/23 09/22/23  Massengill, Rankin, MD  hydrOXYzine  (ATARAX ) 25 MG tablet Take 1 tablet (25 mg total) by mouth 3 (three) times daily as needed for anxiety. 08/23/23   Johny Rankin, MD  melatonin 3  MG TABS tablet Take 1 tablet (3 mg total) by mouth at bedtime as needed. 08/23/23   Massengill, Rankin, MD  metFORMIN  (GLUCOPHAGE -XR) 500 MG 24 hr tablet Take 1 tablet (500 mg total) by mouth daily with breakfast. 08/24/23 09/23/23  Massengill, Rankin, MD  QUEtiapine  (SEROQUEL ) 100 MG tablet Take 1 tablet (100 mg total) by mouth in the morning. 08/23/23 09/22/23  Massengill, Rankin, MD  QUEtiapine  (SEROQUEL ) 400 MG tablet Take 1  tablet (400 mg total) by mouth at bedtime. 08/23/23 09/22/23  Johny Rankin, MD    Physical Exam: Vitals:   07/24/24 0645 07/24/24 0700 07/24/24 0715 07/24/24 0730  BP: 110/79 103/63 132/79 114/68  Pulse: 70 80 93 (!) 102  Resp:      Temp:      SpO2: 97% 94% 96% 99%  Weight:      Height:       General exam: Alert, awake, following commands appropriately and able to answer questions.  Denies suicidal ideation. Respiratory system: Clear to auscultation. Respiratory effort normal. Cardiovascular system:RRR. No rubs or gallops; no JVD. Gastrointestinal system: Abdomen is nondistended, soft and nontender. No organomegaly or masses felt. Normal bowel sounds heard. Central nervous system: Alert and oriented. No focal neurological deficits. Extremities: No cyanosis or clubbing. Skin: No petechiae; no open wounds. Psychiatry: Reporting auditory hallucinations; no suicidal ideation.  Flat affect appreciated.  Data Reviewed: CK: 5230 range Comprehensive metabolic panel: Sodium 140, potassium 3.1, chloride 101, bicarb 26, BUN 10, creatinine 0.78, AST 102, ALT 53 and GFR >60 Ethanol < 15   Assessment and Plan: Nontraumatic rhabdomyolysis - Fluid resuscitation will be provided - As needed analgesics in case of muscle ache will be ordered -Follow CK levels and renal function instability  Hypokalemia - Will replete electrolytes and follow trend  Hypertension - Resuming home antihypertensive agents except for hydrochlorothiazide ; in the setting of nontraumatic rhabdomyolysis and hypokalemia.  Schizophrenia - Continue supportive care - Will resume the use of Seroquel ; once medically stable reach out to psychiatry service to further assist with patient management.  Chronic pain/neuropathy - Will continue Neurontin   Depression/anxiety - As mentioned below continue treatment with Seroquel  - Can continue continue as needed Atarax  - Will provide consulted additional supportive  care.  Insomnia - Continue melatonin.    Advance Care Planning:   Code Status: Full Code   Consults: Psychiatry service once medically stable will be consulted.  Family Communication: No family at bedside.  Severity of Illness: The appropriate patient status for this patient is OBSERVATION. Observation status is judged to be reasonable and necessary in order to provide the required intensity of service to ensure the patient's safety. The patient's presenting symptoms, physical exam findings, and initial radiographic and laboratory data in the context of their medical condition is felt to place them at decreased risk for further clinical deterioration. Furthermore, it is anticipated that the patient will be medically stable for discharge from the hospital within 2 midnights of admission.   Author: Eric Nunnery, MD 07/24/2024 11:36 AM  For on call review [email or web address].

## 2024-07-24 NOTE — Plan of Care (Signed)

## 2024-07-25 DIAGNOSIS — Z79899 Other long term (current) drug therapy: Secondary | ICD-10-CM | POA: Diagnosis not present

## 2024-07-25 DIAGNOSIS — F419 Anxiety disorder, unspecified: Secondary | ICD-10-CM | POA: Diagnosis present

## 2024-07-25 DIAGNOSIS — Z803 Family history of malignant neoplasm of breast: Secondary | ICD-10-CM | POA: Diagnosis not present

## 2024-07-25 DIAGNOSIS — F32A Depression, unspecified: Secondary | ICD-10-CM | POA: Diagnosis present

## 2024-07-25 DIAGNOSIS — Z809 Family history of malignant neoplasm, unspecified: Secondary | ICD-10-CM | POA: Diagnosis not present

## 2024-07-25 DIAGNOSIS — G8929 Other chronic pain: Secondary | ICD-10-CM | POA: Diagnosis present

## 2024-07-25 DIAGNOSIS — M1990 Unspecified osteoarthritis, unspecified site: Secondary | ICD-10-CM | POA: Diagnosis present

## 2024-07-25 DIAGNOSIS — M6282 Rhabdomyolysis: Secondary | ICD-10-CM | POA: Diagnosis present

## 2024-07-25 DIAGNOSIS — Z91013 Allergy to seafood: Secondary | ICD-10-CM | POA: Diagnosis not present

## 2024-07-25 DIAGNOSIS — M069 Rheumatoid arthritis, unspecified: Secondary | ICD-10-CM | POA: Diagnosis present

## 2024-07-25 DIAGNOSIS — G4700 Insomnia, unspecified: Secondary | ICD-10-CM | POA: Diagnosis present

## 2024-07-25 DIAGNOSIS — Z91148 Patient's other noncompliance with medication regimen for other reason: Secondary | ICD-10-CM | POA: Diagnosis not present

## 2024-07-25 DIAGNOSIS — R440 Auditory hallucinations: Secondary | ICD-10-CM | POA: Diagnosis not present

## 2024-07-25 DIAGNOSIS — I10 Essential (primary) hypertension: Secondary | ICD-10-CM | POA: Diagnosis present

## 2024-07-25 DIAGNOSIS — Z833 Family history of diabetes mellitus: Secondary | ICD-10-CM | POA: Diagnosis not present

## 2024-07-25 DIAGNOSIS — G629 Polyneuropathy, unspecified: Secondary | ICD-10-CM | POA: Diagnosis present

## 2024-07-25 DIAGNOSIS — F209 Schizophrenia, unspecified: Secondary | ICD-10-CM | POA: Diagnosis present

## 2024-07-25 DIAGNOSIS — E876 Hypokalemia: Secondary | ICD-10-CM | POA: Diagnosis present

## 2024-07-25 DIAGNOSIS — Z7984 Long term (current) use of oral hypoglycemic drugs: Secondary | ICD-10-CM | POA: Diagnosis not present

## 2024-07-25 DIAGNOSIS — Z87891 Personal history of nicotine dependence: Secondary | ICD-10-CM | POA: Diagnosis not present

## 2024-07-25 DIAGNOSIS — Z880 Allergy status to penicillin: Secondary | ICD-10-CM | POA: Diagnosis not present

## 2024-07-25 DIAGNOSIS — Z90710 Acquired absence of both cervix and uterus: Secondary | ICD-10-CM | POA: Diagnosis not present

## 2024-07-25 DIAGNOSIS — Z560 Unemployment, unspecified: Secondary | ICD-10-CM | POA: Diagnosis not present

## 2024-07-25 DIAGNOSIS — Z6841 Body Mass Index (BMI) 40.0 and over, adult: Secondary | ICD-10-CM | POA: Diagnosis not present

## 2024-07-25 LAB — BASIC METABOLIC PANEL WITH GFR
Anion gap: 7 (ref 5–15)
BUN: 8 mg/dL (ref 6–20)
CO2: 23 mmol/L (ref 22–32)
Calcium: 8 mg/dL — ABNORMAL LOW (ref 8.9–10.3)
Chloride: 109 mmol/L (ref 98–111)
Creatinine, Ser: 0.64 mg/dL (ref 0.44–1.00)
GFR, Estimated: >60 mL/min (ref >60)
Glucose, Bld: 109 mg/dL — ABNORMAL HIGH (ref 70–99)
Potassium: 3.5 mmol/L (ref 3.5–5.1)
Sodium: 139 mmol/L (ref 135–145)

## 2024-07-25 LAB — CK
Total CK: 4803 U/L — ABNORMAL HIGH (ref 38–234)
Total CK: 3643 U/L — ABNORMAL HIGH (ref 38–234)

## 2024-07-25 MED ORDER — FUROSEMIDE 10 MG/ML IJ SOLN
40.0000 mg | Freq: Two times a day (BID) | INTRAMUSCULAR | Status: DC
  Administered 2024-07-25 (×2): 40 mg via INTRAVENOUS
  Filled 2024-07-25 (×3): qty 4

## 2024-07-25 MED ORDER — SODIUM CHLORIDE 0.9 % IV BOLUS
1000.0000 mL | Freq: Once | INTRAVENOUS | Status: AC
  Administered 2024-07-25: 1000 mL via INTRAVENOUS

## 2024-07-25 MED ORDER — POTASSIUM CHLORIDE CRYS ER 20 MEQ PO TBCR
40.0000 meq | EXTENDED_RELEASE_TABLET | Freq: Every day | ORAL | Status: DC
  Administered 2024-07-25 – 2024-07-27 (×3): 40 meq via ORAL
  Filled 2024-07-25 (×3): qty 2

## 2024-07-25 NOTE — Plan of Care (Signed)

## 2024-07-25 NOTE — Progress Notes (Signed)
   07/25/24 1116  TOC Brief Assessment  Insurance and Status Reviewed  Patient has primary care physician No (PCP list added)  Home environment has been reviewed Home with children  Prior level of function: independent  Prior/Current Home Services No current home services  Social Drivers of Health Review SDOH reviewed no interventions necessary  Readmission risk has been reviewed Yes  Transition of care needs no transition of care needs at this time   Transition of Care Department [facility]) has reviewed patient and no TOC needs have been identified at this time. We will continue to monitor patient advancement through interdisciplinary progression rounds. If new patient transition needs arise, please place a TOC consult.

## 2024-07-25 NOTE — Progress Notes (Signed)
  Progress Note   Patient: Rachel Ray FMW:[id number] DOB: 07-Sep-1981 DOA: 07/24/2024     0 DOS: the patient was seen and examined on 07/25/2024   Brief hospital admission narrative: Rachel Ray is a 43 y.o. female with medical history significant of schizophrenia, hypertension, insomnia, depression/anxiety and osteoarthritis; who presented to the hospital secondary to auditory hallucinations. Patient expressed medication noncompliance and for the last 3-4 days has been experiencing increase difficulty sleeping and auditory hallucinations.  No suicidal ideation, no fever, no chest pain, no shortness of breath, no nausea, no vomiting.   Of note, patient expressed son generalized muscle ache and joint discomfort.   Workup in the ED demonstrating nontraumatic rhabdomyolysis; otherwise stable blood work and vital signs.  Encouraged to have patient clear for psychiatry intervention and if needed discharge to inpatient psych facility for further management of her schizophrenia patients will require stabilization of ongoing rhabdomyolysis process.  [facility] has been contacted to place patient in the hospital for fluid resuscitation and further management.  Assessment and plan 1-nontraumatic rhabdomyolysis - Continue to maintain adequate hydration - CK level has started to trend down - Patient reporting generalized muscular discomfort - Continue supportive care. - Anticipating patient to be medically clear on 07/26/2024.  2-hypokalemia - Repleted - Continue to follow electrolytes.  3-hypertension - Continue home antihypertensive agent - HCTZ still on hold; Lasix  provided  4-schizophrenia/depression and anxiety - Continue treatment with Seroquel  and as needed Atarax  - No suicidal ideation - On today's exam denying any hallucinations - Psychiatry service recommended inpatient therapy once medically stable.  5-morbid obesity -Body mass index is 50.2 kg/m.  -Low-calorie diet, portion control and  increase physical activity discussed with patient.  6-insomnia - Continue treatment with melatonin.  Subjective:  Overall feeling better; no chest pain, no nausea, no vomiting, no shortness of breath.  Patient is afebrile.  Physical Exam: Vitals:   07/25/24 0234 07/25/24 0624 07/25/24 0850 07/25/24 1309  BP: 93/65 109/65 96/81 119/84  Pulse: (!) 55 (!) 51 63 64  Resp: 18 20  19   Temp: 98 F (36.7 C) 97.8 F (36.6 C)  97.6 F (36.4 C)  TempSrc: Oral Oral  Oral  SpO2: 97% 100% 100% 98%  Weight:      Height:       General exam: Alert, awake, oriented x 3; in no acute distress. Respiratory system: Clear to auscultation. Respiratory effort normal.  Good saturation on room air. Cardiovascular system:RRR. No murmurs, rubs, gallops. Gastrointestinal system: Abdomen is obese, nondistended, soft and nontender. No organomegaly or masses felt. Normal bowel sounds heard. Central nervous system: Alert and oriented. No focal neurological deficits. Extremities: No C/C/E, +pedal pulses Skin: No petechiae. Psychiatry: Affect appreciated on exam.  Denying hallucinations and currently without any suicidal ideation or thoughts.   Data Reviewed: CK total: 4803 Basic metabolic panel: Sodium 139, potassium 3.5, chloride 109, bicarb 23, BUN 8, creatinine 0.64 and GFR >60  Family Communication: No family at bedside.  Disposition: Status is: Inpatient Remains inpatient appropriate because: Continue IV therapy.  Pending further medical stabilization by psychiatry service.  For now anticipated plan is to discharge to psych facility for further treatment once medically clear.  Time spent: 50 minutes  Author: Eric Nunnery, MD 07/25/2024 6:09 PM  For on call review [email or web address].

## 2024-07-25 NOTE — Discharge Instructions (Signed)

## 2024-07-25 NOTE — Plan of Care (Signed)

## 2024-07-25 NOTE — Progress Notes (Signed)
 Patient's blood pressure was noted to be 88/45 (soft BP). Dr. Charlton was notified, and an order was received for a normal saline bolus in addition to the ongoing NS infusion at 150 mL/hr. The patient subsequently lost IV access and refused reinsertion, stating, "I'm sick of needle punctures." Risks and benefits of IV access and fluid replacement were explained, and the patient verbalized understanding but continued to refuse. Dr. Charlton was updated regarding the patient's refusal

## 2024-07-26 DIAGNOSIS — M6282 Rhabdomyolysis: Secondary | ICD-10-CM | POA: Diagnosis not present

## 2024-07-26 LAB — BASIC METABOLIC PANEL WITH GFR
Anion gap: 8 (ref 5–15)
BUN: 10 mg/dL (ref 6–20)
CO2: 26 mmol/L (ref 22–32)
Calcium: 8.5 mg/dL — ABNORMAL LOW (ref 8.9–10.3)
Chloride: 107 mmol/L (ref 98–111)
Creatinine, Ser: 0.65 mg/dL (ref 0.44–1.00)
GFR, Estimated: >60 mL/min (ref >60)
Glucose, Bld: 124 mg/dL — ABNORMAL HIGH (ref 70–99)
Potassium: 3.1 mmol/L — ABNORMAL LOW (ref 3.5–5.1)
Sodium: 141 mmol/L (ref 135–145)

## 2024-07-26 LAB — CK
Total CK: 3489 U/L — ABNORMAL HIGH (ref 38–234)
Total CK: 2771 U/L — ABNORMAL HIGH (ref 38–234)

## 2024-07-26 LAB — ALBUMIN: Albumin: 3 g/dL — ABNORMAL LOW (ref 3.5–5.0)

## 2024-07-26 MED ORDER — MELATONIN 3 MG PO TABS
6.0000 mg | ORAL_TABLET | Freq: Every day | ORAL | Status: DC
  Administered 2024-07-26: 6 mg via ORAL
  Filled 2024-07-26: qty 2

## 2024-07-26 MED ORDER — LACTATED RINGERS IV SOLN: INTRAVENOUS | Status: DC

## 2024-07-26 MED ORDER — FUROSEMIDE 10 MG/ML IJ SOLN: 40.0000 mg | Freq: Two times a day (BID) | INTRAMUSCULAR | Status: DC

## 2024-07-26 NOTE — Progress Notes (Signed)
 Progress Note   Patient: Rachel Ray FMW:[id number] DOB: Jul 10, 1981 DOA: 07/24/2024     1 DOS: the patient was seen and examined on 07/26/2024   Brief hospital admission narrative: Rachel Ray is a 43 y.o. female with medical history significant of schizophrenia, hypertension, insomnia, depression/anxiety and osteoarthritis; who presented to the hospital secondary to auditory hallucinations. Patient expressed medication noncompliance and for the last 3-4 days has been experiencing increase difficulty sleeping and auditory hallucinations.  No suicidal ideation, no fever, no chest pain, no shortness of breath, no nausea, no vomiting.   Of note, patient expressed generalized muscle ache and joint discomfort.   Workup in the ED demonstrating nontraumatic rhabdomyolysis; otherwise stable blood work and vital signs.  Encouraged to have patient clear for psychiatry intervention and if needed discharge to inpatient psych facility for further management of her schizophrenia patients will require stabilization of ongoing rhabdomyolysis process.  [facility] has been contacted to place patient in the hospital for fluid resuscitation and further management. ---------------------------------------------------------------------------------------------------------------------------   Subjective:  The patient was seen and examined this morning, stable no acute distress reporting feeling much better -Denies of having any homicidal suicidal ideation Reports of no visual or auditory hallucination today    ======================================================================  Assessment and plan Bontraumatic rhabdomyolysis - Continue to maintain adequate hydration -Will continue with IV F and oral hydration - CK level has started to trend down - Patient reporting generalized muscular discomfort - Continue supportive care. - Anticipating patient to be medically clear on 07/27/2024. CK total: 4803 >>>>  3,489 Creatinine stable at 0.65  Hypokalemia - Monitoring, will continue to replete   Hypertension - Continue home antihypertensive agent - HCTZ still on hold; Lasix -holding for today  Schizophrenia/depression and anxiety - Continue treatment with Seroquel  and as needed Atarax  - No suicidal ideation - On today's exam denying any hallucinations - Psychiatry service recommended inpatient therapy once medically stable.  Morbid obesity -Body mass index is 50.2 kg/m.  -Low-calorie diet, portion control and increase physical activity discussed with patient.  Insomnia - Continue treatment with melatonin.    Physical Exam: Vitals:   07/25/24 1309 07/25/24 2043 07/26/24 0451 07/26/24 0742  BP: 119/84 123/83 (!) 90/52 125/66  Pulse: 64 (!) 59 65 70  Resp: 19 16 16    Temp: 97.6 F (36.4 C) 98.2 F (36.8 C) 97.9 F (36.6 C)   TempSrc: Oral Oral Oral   SpO2: 98% 99% 96% 93%  Weight:      Height:         General:  AAO x 3,  cooperative, no distress;  Denies of having any auditory or visual hallucination Denies having any homicidal suicidal ideation  HEENT:  Normocephalic, PERRL, otherwise with in Normal limits   Neuro:  CNII-XII intact. , normal motor and sensation, reflexes intact   Lungs:   Clear to auscultation BL, Respirations unlabored,  No wheezes / crackles  Cardio:    S1/S2, RRR, No murmure, No Rubs or Gallops   Abdomen:  Soft, non-tender, bowel sounds active all four quadrants, no guarding or peritoneal signs.  Muscular  skeletal:  Limited exam -global generalized weaknesses - in bed, able to move all 4 extremities,   2+ pulses,  symmetric, No pitting edema  Skin:  Dry, warm to touch, negative for any Rashes,  Wounds: Please see nursing documentation           Data Reviewed: CK total: 4803 >>>> 3,489     Latest Ref Rng & Units 07/26/2024  4:23 AM 07/25/2024    4:40 AM 07/24/2024    4:54 AM  CMP  Glucose 70 - 99 mg/dL 875  890  97   BUN 6 - 20 mg/dL  10  8  10    Creatinine 0.44 - 1.00 mg/dL 9.34  9.35  9.21   Sodium 135 - 145 mmol/L 141  139  140   Potassium 3.5 - 5.1 mmol/L 3.1  3.5  3.1   Chloride 98 - 111 mmol/L 107  109  101   CO2 22 - 32 mmol/L 26  23  26    Calcium 8.9 - 10.3 mg/dL 8.5  8.0  9.3   Total Protein 6.5 - 8.1 g/dL   8.0   Total Bilirubin 0.0 - 1.2 mg/dL   0.9   Alkaline Phos 38 - 126 U/L   62   AST 15 - 41 U/L   102   ALT 0 - 44 U/L   53       Latest Ref Rng & Units 07/24/2024    4:54 AM 08/17/2023   10:20 PM 06/23/2023   11:18 AM  CBC  WBC 4.0 - 10.5 K/uL 7.1  6.1  6.6   Hemoglobin 12.0 - 15.0 g/dL 86.9  86.9  87.6   Hematocrit 36.0 - 46.0 % 38.6  39.6  37.5   Platelets 150 - 400 K/uL 227  173  188     Family Communication: No family at bedside.  Disposition: Status is: Inpatient Remains inpatient appropriate because: Continue IV therapy.  Pending further medical stabilization by psychiatry service.  For now anticipated plan is to discharge to psych facility for further treatment once medically clear.  Time spent: 50 minutes  Author: Adriana DELENA Grams, MD 07/26/2024 12:09 PM  For on call review [email or web address].

## 2024-07-26 NOTE — Plan of Care (Signed)
  Problem: Education: Goal: Knowledge of General Education information will improve Description: Including pain rating scale, medication(s)/side effects and non-pharmacologic comfort measures Outcome: Progressing   Problem: Health Behavior/Discharge Planning: Goal: Ability to manage health-related needs will improve Outcome: Progressing   Problem: Clinical Measurements: Goal: Ability to maintain clinical measurements within normal limits will improve Outcome: Progressing   Problem: Activity: Goal: Risk for activity intolerance will decrease Outcome: Progressing   Problem: Nutrition: Goal: Adequate nutrition will be maintained Outcome: Progressing   Problem: Coping: Goal: Level of anxiety will decrease Outcome: Not Met (add Reason)

## 2024-07-26 NOTE — Plan of Care (Signed)

## 2024-07-27 ENCOUNTER — Encounter (HOSPITAL_COMMUNITY): Payer: Self-pay

## 2024-07-27 ENCOUNTER — Inpatient Hospital Stay (HOSPITAL_COMMUNITY)
Admission: AD | Admit: 2024-07-27 | Discharge: 2024-07-30 | DRG: 885 | Disposition: A | Discharge status: Home / Self Care | Source: Intra-hospital

## 2024-07-27 ENCOUNTER — Other Ambulatory Visit: Payer: Self-pay

## 2024-07-27 DIAGNOSIS — Z8249 Family history of ischemic heart disease and other diseases of the circulatory system: Secondary | ICD-10-CM

## 2024-07-27 DIAGNOSIS — Z818 Family history of other mental and behavioral disorders: Secondary | ICD-10-CM

## 2024-07-27 DIAGNOSIS — Z91199 Patient's noncompliance with other medical treatment and regimen due to unspecified reason: Secondary | ICD-10-CM | POA: Diagnosis not present

## 2024-07-27 DIAGNOSIS — Z803 Family history of malignant neoplasm of breast: Secondary | ICD-10-CM

## 2024-07-27 DIAGNOSIS — Z90710 Acquired absence of both cervix and uterus: Secondary | ICD-10-CM

## 2024-07-27 DIAGNOSIS — Z560 Unemployment, unspecified: Secondary | ICD-10-CM

## 2024-07-27 DIAGNOSIS — Z716 Tobacco abuse counseling: Secondary | ICD-10-CM | POA: Diagnosis not present

## 2024-07-27 DIAGNOSIS — I10 Essential (primary) hypertension: Secondary | ICD-10-CM | POA: Diagnosis present

## 2024-07-27 DIAGNOSIS — Z9151 Personal history of suicidal behavior: Secondary | ICD-10-CM

## 2024-07-27 DIAGNOSIS — F17290 Nicotine dependence, other tobacco product, uncomplicated: Secondary | ICD-10-CM | POA: Diagnosis present

## 2024-07-27 DIAGNOSIS — M069 Rheumatoid arthritis, unspecified: Secondary | ICD-10-CM | POA: Diagnosis present

## 2024-07-27 DIAGNOSIS — Z79899 Other long term (current) drug therapy: Secondary | ICD-10-CM

## 2024-07-27 DIAGNOSIS — F200 Paranoid schizophrenia: Secondary | ICD-10-CM | POA: Diagnosis present

## 2024-07-27 DIAGNOSIS — G4700 Insomnia, unspecified: Secondary | ICD-10-CM | POA: Diagnosis present

## 2024-07-27 DIAGNOSIS — Z9049 Acquired absence of other specified parts of digestive tract: Secondary | ICD-10-CM

## 2024-07-27 DIAGNOSIS — F419 Anxiety disorder, unspecified: Secondary | ICD-10-CM | POA: Diagnosis present

## 2024-07-27 DIAGNOSIS — F209 Schizophrenia, unspecified: Principal | ICD-10-CM | POA: Diagnosis present

## 2024-07-27 DIAGNOSIS — M6282 Rhabdomyolysis: Secondary | ICD-10-CM | POA: Diagnosis not present

## 2024-07-27 DIAGNOSIS — Z9152 Personal history of nonsuicidal self-harm: Secondary | ICD-10-CM

## 2024-07-27 DIAGNOSIS — Z7984 Long term (current) use of oral hypoglycemic drugs: Secondary | ICD-10-CM

## 2024-07-27 DIAGNOSIS — Z833 Family history of diabetes mellitus: Secondary | ICD-10-CM

## 2024-07-27 LAB — BASIC METABOLIC PANEL WITH GFR
Anion gap: 9 (ref 5–15)
BUN: 8 mg/dL (ref 6–20)
CO2: 21 mmol/L — ABNORMAL LOW (ref 22–32)
Calcium: 9 mg/dL (ref 8.9–10.3)
Chloride: 109 mmol/L (ref 98–111)
Creatinine, Ser: 0.64 mg/dL (ref 0.44–1.00)
GFR, Estimated: >60 mL/min (ref >60)
Glucose, Bld: 108 mg/dL — ABNORMAL HIGH (ref 70–99)
Potassium: 3.4 mmol/L — ABNORMAL LOW (ref 3.5–5.1)
Sodium: 139 mmol/L (ref 135–145)

## 2024-07-27 LAB — CK: Total CK: 2171 U/L — ABNORMAL HIGH (ref 38–234)

## 2024-07-27 MED ORDER — HALOPERIDOL LACTATE 5 MG/ML IJ SOLN: 10.0000 mg | Freq: Three times a day (TID) | INTRAMUSCULAR | Status: DC | PRN

## 2024-07-27 MED ORDER — QUETIAPINE FUMARATE 400 MG PO TABS
400.0000 mg | ORAL_TABLET | Freq: Every day | ORAL | Status: DC
  Administered 2024-07-27 – 2024-07-29 (×3): 400 mg via ORAL
  Filled 2024-07-27 (×3): qty 2

## 2024-07-27 MED ORDER — POTASSIUM CHLORIDE CRYS ER 20 MEQ PO TBCR: 20.0000 meq | EXTENDED_RELEASE_TABLET | Freq: Every day | ORAL | 0 refills | Status: AC

## 2024-07-27 MED ORDER — LORAZEPAM 2 MG/ML IJ SOLN: 2.0000 mg | Freq: Three times a day (TID) | INTRAMUSCULAR | Status: DC | PRN

## 2024-07-27 MED ORDER — FUROSEMIDE 40 MG PO TABS
40.0000 mg | ORAL_TABLET | Freq: Every day | ORAL | Status: DC
  Administered 2024-07-27: 40 mg via ORAL
  Filled 2024-07-27: qty 1

## 2024-07-27 MED ORDER — QUETIAPINE FUMARATE 100 MG PO TABS
100.0000 mg | ORAL_TABLET | Freq: Every morning | ORAL | Status: DC
  Administered 2024-07-28 – 2024-07-30 (×3): 100 mg via ORAL
  Filled 2024-07-27 (×3): qty 1

## 2024-07-27 MED ORDER — MAGNESIUM HYDROXIDE 400 MG/5ML PO SUSP: 30.0000 mL | Freq: Every day | ORAL | Status: DC | PRN

## 2024-07-27 MED ORDER — NICOTINE 14 MG/24HR TD PT24
14.0000 mg | MEDICATED_PATCH | Freq: Every day | TRANSDERMAL | Status: DC
  Administered 2024-07-27 – 2024-07-28 (×2): 14 mg via TRANSDERMAL
  Filled 2024-07-27 (×3): qty 1

## 2024-07-27 MED ORDER — ONDANSETRON 4 MG PO TBDP: 8.0000 mg | ORAL_TABLET | Freq: Three times a day (TID) | ORAL | Status: DC | PRN

## 2024-07-27 MED ORDER — ALUM & MAG HYDROXIDE-SIMETH 200-200-20 MG/5ML PO SUSP: 30.0000 mL | ORAL | Status: DC | PRN

## 2024-07-27 MED ORDER — GABAPENTIN 100 MG PO CAPS
100.0000 mg | ORAL_CAPSULE | Freq: Three times a day (TID) | ORAL | Status: DC
  Administered 2024-07-27 – 2024-07-30 (×6): 100 mg via ORAL
  Filled 2024-07-27 (×6): qty 1

## 2024-07-27 MED ORDER — HALOPERIDOL 5 MG PO TABS
5.0000 mg | ORAL_TABLET | Freq: Three times a day (TID) | ORAL | Status: DC | PRN
  Administered 2024-07-28: 5 mg via ORAL
  Filled 2024-07-27: qty 1

## 2024-07-27 MED ORDER — HYDROXYZINE HCL 25 MG PO TABS
25.0000 mg | ORAL_TABLET | Freq: Three times a day (TID) | ORAL | Status: DC | PRN
  Administered 2024-07-27 – 2024-07-28 (×2): 25 mg via ORAL
  Filled 2024-07-27 (×2): qty 1

## 2024-07-27 MED ORDER — DIPHENHYDRAMINE HCL 50 MG/ML IJ SOLN: 50.0000 mg | Freq: Three times a day (TID) | INTRAMUSCULAR | Status: DC | PRN

## 2024-07-27 MED ORDER — POTASSIUM CHLORIDE CRYS ER 20 MEQ PO TBCR
40.0000 meq | EXTENDED_RELEASE_TABLET | Freq: Every day | ORAL | Status: DC
  Administered 2024-07-28: 40 meq via ORAL
  Filled 2024-07-27: qty 2

## 2024-07-27 MED ORDER — HALOPERIDOL LACTATE 5 MG/ML IJ SOLN: 5.0000 mg | Freq: Three times a day (TID) | INTRAMUSCULAR | Status: DC | PRN

## 2024-07-27 MED ORDER — DIPHENHYDRAMINE HCL 25 MG PO CAPS
50.0000 mg | ORAL_CAPSULE | Freq: Three times a day (TID) | ORAL | Status: DC | PRN
  Administered 2024-07-28: 50 mg via ORAL
  Filled 2024-07-27: qty 2

## 2024-07-27 MED ORDER — MELATONIN 3 MG PO TABS
6.0000 mg | ORAL_TABLET | Freq: Every day | ORAL | Status: DC
  Administered 2024-07-27 – 2024-07-29 (×3): 6 mg via ORAL
  Filled 2024-07-27 (×3): qty 2

## 2024-07-27 MED ORDER — ACETAMINOPHEN 325 MG PO TABS
650.0000 mg | ORAL_TABLET | Freq: Four times a day (QID) | ORAL | Status: DC | PRN
  Administered 2024-07-27 – 2024-07-30 (×4): 650 mg via ORAL
  Filled 2024-07-27 (×4): qty 2

## 2024-07-27 NOTE — Tx Team (Signed)
 Initial Treatment Plan 07/27/2024 3:27 PM Rachel Ray FMW:[id number]    PATIENT STRESSORS: Financial difficulties   Health problems   Marital or family conflict     PATIENT STRENGTHS: Ability for insight  Communication skills  Motivation for treatment/growth    PATIENT IDENTIFIED PROBLEMS: Learn to communicate with others  Make sure I take my medications  Medication noncompliance  Difficulties sleeping  Poor appetite  Ineffective coping skills           DISCHARGE CRITERIA:  Adequate post-discharge living arrangements Motivation to continue treatment in a less acute level of care  PRELIMINARY DISCHARGE PLAN: Outpatient therapy Return to previous living arrangement  PATIENT/FAMILY INVOLVEMENT: This treatment plan has been presented to and reviewed with the patient, Rachel Ray, and/or family member.  The patient and family have been given the opportunity to ask questions and make suggestions.  Almarie MALVA Lowers, RN 07/27/2024, 3:27 PM

## 2024-07-27 NOTE — TOC Transition Note (Addendum)
 Transition of Care [facility]) - Discharge Note   Patient Details  Name: Rachel Ray MRN: [id number] Date of Birth: 12-24-1980  [facility] CM/SW Contact:  Sharlyne Stabs, RN Phone Number: 07/27/2024, 10:36 AM   Clinical Narrative:   Patient is medically cleared, [facility] has a bed, adult unit bed assignment is 507-. They have provided RN with instruction for discharge.  Volunteer consent signed and faxed. TOC set up [employer], team updated.    Final next level of care: Acute to Acute Transfer Barriers to Discharge: Barriers Resolved   Patient Goals and CMS Choice Patient states their goals for this hospitalization and ongoing recovery are:: [facility] CMS Medicare.gov Compare Post Acute Care list provided to:: Patient Choice offered to / list presented to : Patient [place] ownership interest in [facility].provided to:: Patient    Discharge Placement      Patient and family notified of of transfer: 07/27/24  Discharge Plan and Services Additional resources added to the After Visit Summary for         Social Drivers of Health (SDOH) Interventions SDOH Screenings   Food Insecurity: Food Insecurity Present (07/24/2024)  Housing: Unknown (07/24/2024)  Transportation Needs: Unmet Transportation Needs (07/24/2024)  Utilities: Not At Risk (07/24/2024)  Alcohol Screen: Low Risk  (08/19/2023)  Depression (PHQ2-9): Medium Risk (04/24/2023)  Tobacco Use: Medium Risk (07/24/2024)

## 2024-07-27 NOTE — Plan of Care (Signed)
   Problem: Education: Goal: Emotional status will improve Outcome: Not Progressing Goal: Mental status will improve Outcome: Not Progressing

## 2024-07-27 NOTE — Plan of Care (Signed)

## 2024-07-27 NOTE — Plan of Care (Signed)
  Problem: Education: Goal: Knowledge of General Education information will improve Description: Including pain rating scale, medication(s)/side effects and non-pharmacologic comfort measures Outcome: Progressing   Problem: Health Behavior/Discharge Planning: Goal: Ability to manage health-related needs will improve Outcome: Progressing   Problem: Clinical Measurements: Goal: Ability to maintain clinical measurements within normal limits will improve Outcome: Progressing Goal: Will remain free from infection Outcome: Progressing Goal: Diagnostic test results will improve Outcome: Progressing Goal: Respiratory complications will improve Outcome: Progressing Goal: Cardiovascular complication will be avoided Outcome: Progressing   Problem: Activity: Goal: Risk for activity intolerance will decrease Outcome: Progressing   Problem: Elimination: Goal: Will not experience complications related to bowel motility Outcome: Progressing Goal: Will not experience complications related to urinary retention Outcome: Progressing   Problem: Safety: Goal: Ability to remain free from injury will improve Outcome: Progressing   Problem: Skin Integrity: Goal: Risk for impaired skin integrity will decrease Outcome: Progressing   

## 2024-07-27 NOTE — Plan of Care (Signed)
   Problem: Education: Goal: Emotional status will improve Outcome: Progressing Goal: Mental status will improve Outcome: Progressing   Problem: Activity: Goal: Interest or engagement in activities will improve Outcome: Progressing   Problem: Safety: Goal: Periods of time without injury will increase Outcome: Progressing

## 2024-07-27 NOTE — Progress Notes (Signed)
(  Sleep Hours) -3.25   (Any PRNs that were needed, meds refused, or side effects to meds)- Vistaril  25 mg  (Any disturbances and when (visitation, over night)-N/A  (Concerns raised by the patient)- Pt stated the voices are getting better since taking her medications. Pt remembered writer from previous admission.   (SI/HI/AVH)- +ve AH , denies SI / HI / VH

## 2024-07-27 NOTE — Group Note (Signed)
 Date:  07/27/2024 Time:  6:28 PM  Group Topic/Focus:  Goals Group:   The focus of this group is to help patients establish daily goals to achieve during treatment and discuss how the patient can incorporate goal setting into their daily lives to aide in recovery. Orientation:   The focus of this group is to educate the patient on the purpose and policies of crisis stabilization and provide a format to answer questions about their admission.  The group details unit policies and expectations of patients while admitted.    Participation Level:  Did Not Attend   Rachel Ray [name] 07/27/2024, 6:28 PM

## 2024-07-27 NOTE — Group Note (Signed)

## 2024-07-27 NOTE — Group Note (Signed)
 Date:  07/27/2024 Time:  8:24 PM  Group Topic/Focus:  Wrap-Up Group:   The focus of this group is to help patients review their daily goal of treatment and discuss progress on daily workbooks.    Participation Level:  Active  Participation Quality:  Appropriate  Affect:  Appropriate  Cognitive:  Appropriate  Insight: Appropriate  Engagement in Group:  Engaged  Modes of Intervention:  Education and Exploration  Additional Comments:  Patient attended and participated in group tonight.  She reports that her goal for today was to work on her communication.  She did meet her goal by talking to peers.  Gwenn Chillington Dacosta 07/27/2024, 8:24 PM

## 2024-07-27 NOTE — Discharge Summary (Addendum)
 Physician Discharge Summary   Patient: Rachel Ray MRN: [id number] DOB: Jun 20, 1981  Admit date:     07/24/2024  Discharge date: 07/27/24  Discharge Physician: Adriana DELENA Grams   PCP: Patient, No Pcp Per   Discharge to behavioral health-psych service   Recommendations at discharge:   Follow-up with psych service ASAP   Discharge Diagnoses: Principal Problem:   Rhabdomyolysis Active Problems:   Schizophrenia [facility])   Auditory hallucinations   Brief hospital admission narrative: Rachel Ray is a 43 y.o. female with medical history significant of schizophrenia, hypertension, insomnia, depression/anxiety and osteoarthritis; who presented to the hospital secondary to auditory hallucinations. Patient expressed medication noncompliance and for the last 3-4 days has been experiencing increase difficulty sleeping and auditory hallucinations.  No suicidal ideation, no fever, no chest pain, no shortness of breath, no nausea, no vomiting.   Of note, patient expressed generalized muscle ache and joint discomfort.   Workup in the ED demonstrating nontraumatic rhabdomyolysis; otherwise stable blood work and vital signs.  Encouraged to have patient clear for psychiatry intervention and if needed discharge to inpatient psych facility for further management of her schizophrenia patients will require stabilization of ongoing rhabdomyolysis process.  [facility] has been contacted to place patient in the hospital for fluid resuscitation and further management. ---------------------------------------------------------------------------------------------------------------------------     Subjective:  The patient was seen and examined this morning, stable no acute distress. Hemodynamically stable Denies of having any auditory or visual hallucination Denies of any homicidal suicidal ideation       ======================================================================   Assessment and plan Bontraumatic  rhabdomyolysis - Continue to maintain adequate hydration - S/p aggressive IV fluid resuscitation - CK level has started to trend down - Patient reporting generalized muscular discomfort - Continue supportive care. -    Hypokalemia - Improved, repleted     Hypertension - Continue home antihypertensive agent - Hold HCTZ, continue p.o. Lasix  Discontinued IV Lasix    Schizophrenia/depression and anxiety - Continue treatment with Seroquel  and as needed Atarax  - Denies no suicidal ideation-or homicidal ideation - Denies of having any auditory or visual hallucination - Psychiatry service recommended inpatient therapy once medically stable.   Patient is medically stable to be transferred to psych service today        morbid obesity -Body mass index is 50.2 kg/m.  -Low-calorie diet, portion control and increase physical activity discussed with patient.   Insomnia - Continue treatment with melatonin.    Disposition: To behavioral health/psych Diet recommendation:  Cardiac diet DISCHARGE MEDICATION: Allergies as of 07/27/2024       Reactions   Shellfish Allergy Anaphylaxis, Shortness Of Breath, Swelling   Penicillins Itching        Medication List     STOP taking these medications    cloNIDine  0.2 MG tablet Commonly known as: CATAPRES    hydrochlorothiazide  25 MG tablet Commonly known as: HYDRODIURIL    potassium chloride  10 MEQ tablet Commonly known as: KLOR-CON        TAKE these medications    Buprenorphine HCl-Naloxone HCl 2-0.5 MG Film Place 1 Film under the tongue daily.   gabapentin  100 MG capsule Commonly known as: NEURONTIN  Take 1 capsule (100 mg total) by mouth 3 (three) times daily.   hydrOXYzine  25 MG tablet Commonly known as: ATARAX  Take 1 tablet (25 mg total) by mouth 3 (three) times daily as needed for anxiety.   melatonin 3 MG Tabs tablet Take 1 tablet (3 mg total) by mouth at bedtime as needed.   metFORMIN  500 MG  24 hr tablet Commonly  known as: GLUCOPHAGE -XR Take 1 tablet (500 mg total) by mouth daily with breakfast.   potassium chloride  SA 20 MEQ tablet Commonly known as: KLOR-CON  M Take 1 tablet (20 mEq total) by mouth daily.   QUEtiapine  100 MG tablet Commonly known as: SEROQUEL  Take 1 tablet (100 mg total) by mouth in the morning.   QUEtiapine  400 MG tablet Commonly known as: SEROQUEL  Take 1 tablet (400 mg total) by mouth at bedtime.        Discharge Exam: Filed Weights   07/24/24 0412 07/24/24 1200  Weight: 119 kg 124.5 kg      General:  AAO x 3,  cooperative, no distress;  Denies of having any auditory or visual hallucination  HEENT:  Normocephalic, PERRL, otherwise with in Normal limits   Neuro:  CNII-XII intact. , normal motor and sensation, reflexes intact   Lungs:   Clear to auscultation BL, Respirations unlabored,  No wheezes / crackles  Cardio:    S1/S2, RRR, No murmure, No Rubs or Gallops   Abdomen:  Soft, non-tender, bowel sounds active all four quadrants, no guarding or peritoneal signs.  Muscular  skeletal:  Limited exam -global generalized weaknesses - in bed, able to move all 4 extremities,   2+ pulses,  symmetric, No pitting edema  Skin:  Dry, warm to touch, negative for any Rashes,  Wounds: Please see nursing documentation     Condition at discharge: good  The results of significant diagnostics from this hospitalization (including imaging, microbiology, ancillary and laboratory) are listed below for reference.   Imaging Studies: No results found.  Microbiology: Results for orders placed or performed during the hospital encounter of 03/15/16  Wet prep, genital     Status: Abnormal   Collection Time: 03/15/16  4:46 PM   Specimen: Vaginal; Genital  Result Value Ref Range Status   Yeast Wet Prep HPF POC NONE SEEN NONE SEEN Final   Trich, Wet Prep NONE SEEN NONE SEEN Final   Clue Cells Wet Prep HPF POC PRESENT (A) NONE SEEN Final   WBC, Wet Prep HPF POC NONE SEEN NONE SEEN  Final   Sperm NONE SEEN  Final    Labs: CBC: Recent Labs  Lab 07/24/24 0454  WBC 7.1  HGB 13.0  HCT 38.6  MCV 92.1  PLT 227   Basic Metabolic Panel: Recent Labs  Lab 07/24/24 0454 07/25/24 0440 07/26/24 0423 07/27/24 0419  NA 140 139 141 139  K 3.1* 3.5 3.1* 3.4*  CL 101 109 107 109  CO2 26 23 26  21*  GLUCOSE 97 109* 124* 108*  BUN 10 8 10 8   CREATININE 0.78 0.64 0.65 0.64  CALCIUM 9.3 8.0* 8.5* 9.0   Liver Function Tests: Recent Labs  Lab 07/24/24 0454 07/26/24 0423  AST 102*  --   ALT 53*  --   ALKPHOS 62  --   BILITOT 0.9  --   PROT 8.0  --   ALBUMIN 3.9 3.0*   CBG: No results for input(s): GLUCAP in the last 168 hours.  Discharge time spent: greater than 30 minutes.  Signed: Adriana DELENA Grams, MD [facility] 07/27/2024

## 2024-07-27 NOTE — Progress Notes (Signed)
 Admission Note: Patient is a 43 years old female admitted to the unit voluntarily from [facility] for medication non adherence, poor sleep and appetite.  Per report: Patient called EMS to report having auditory hallucination.  Patient diagnosed with Rhabdomyolysis while in the hospital.  Patient denies SI/HI/AVH.  Patient is alert and oriented x 4.  Presents with a calm affect and depressed mood.  Stated she is here to work on communicating with others, and making sure she takes her medications as prescribed.  Admission plan of care reviewed, treatment consent signed.  Skin and personal belongings completed.  Skin is dry and intact.  Pitting edema noted on bilateral lower extremities.  Items deemed contraband placed in the locker.  Patient oriented to the unit, staff and room.  Verbalizes understanding of unit rules/protocols.  Patient is safe on the unit.

## 2024-07-27 NOTE — Progress Notes (Signed)
 Pt lost PIV, tired 3 times with different nurses to get another PIV and couldn't.  No one available to do ultrasound guided PIV at this time.  Continuous fluids stopped, encouraging PO fluid intake now.  Will pass along to day shift. Provider notified.  Will continue to monitor.

## 2024-07-27 NOTE — Progress Notes (Signed)
 Progress Note   Patient: Rachel Ray FMW:[id number] DOB: 06-Mar-1981 DOA: 07/24/2024     2 DOS: the patient was seen and examined on 07/27/2024   Patient is medically stable to transfer to psych service     Brief hospital admission narrative: Rachel Ray is a 43 y.o. female with medical history significant of schizophrenia, hypertension, insomnia, depression/anxiety and osteoarthritis; who presented to the hospital secondary to auditory hallucinations. Patient expressed medication noncompliance and for the last 3-4 days has been experiencing increase difficulty sleeping and auditory hallucinations.  No suicidal ideation, no fever, no chest pain, no shortness of breath, no nausea, no vomiting.   Of note, patient expressed generalized muscle ache and joint discomfort.   Workup in the ED demonstrating nontraumatic rhabdomyolysis; otherwise stable blood work and vital signs.  Encouraged to have patient clear for psychiatry intervention and if needed discharge to inpatient psych facility for further management of her schizophrenia patients will require stabilization of ongoing rhabdomyolysis process.  [facility] has been contacted to place patient in the hospital for fluid resuscitation and further management. ---------------------------------------------------------------------------------------------------------------------------   Subjective:  The patient was seen and examined this morning, stable no acute distress. Hemodynamically stable Denies of having any auditory or visual hallucination Denies of any homicidal suicidal ideation    ======================================================================  Assessment and plan Bontraumatic rhabdomyolysis - Continue to maintain adequate hydration - S/p aggressive IV fluid resuscitation - CK Ray has started to trend down - Patient reporting generalized muscular discomfort - Continue supportive care. -   Hypokalemia - Improved,  repleted   Hypertension - Continue home antihypertensive agent - Hold HCTZ, continue p.o. Lasix  Discontinued IV Lasix   Schizophrenia/depression and anxiety - Continue treatment with Seroquel  and as needed Atarax  - Denies no suicidal ideation-or homicidal ideation - Denies of having any auditory or visual hallucination - Psychiatry service recommended inpatient therapy once medically stable.  Patient is medically stable to be transferred to psych service today     morbid obesity -Body mass index is 50.2 kg/m.  -Low-calorie diet, portion control and increase physical activity discussed with patient.  Insomnia - Continue treatment with melatonin.    Physical Exam: Vitals:   07/26/24 0742 07/26/24 1412 07/26/24 2026 07/27/24 0521  BP: 125/66 118/83 131/88 118/79  Pulse: 70 61 77 65  Resp:  20 16 18   Temp:  98.8 F (37.1 C) 98.7 F (37.1 C)   TempSrc:  Oral Oral   SpO2: 93% 100% 100% 100%  Weight:      Height:             General:  AAO x 3,  cooperative, no distress;  Denies of having any auditory or visual hallucination  HEENT:  Normocephalic, PERRL, otherwise with in Normal limits   Neuro:  CNII-XII intact. , normal motor and sensation, reflexes intact   Lungs:   Clear to auscultation BL, Respirations unlabored,  No wheezes / crackles  Cardio:    S1/S2, RRR, No murmure, No Rubs or Gallops   Abdomen:  Soft, non-tender, bowel sounds active all four quadrants, no guarding or peritoneal signs.  Muscular  skeletal:  Limited exam -global generalized weaknesses - in bed, able to move all 4 extremities,   2+ pulses,  symmetric, No pitting edema  Skin:  Dry, warm to touch, negative for any Rashes,  Wounds: Please see nursing documentation            Data Reviewed: CK total: 4803 >>>> 3,489     Latest Ref Rng &  Units 07/27/2024    4:19 AM 07/26/2024    4:23 AM 07/25/2024    4:40 AM  CMP  Glucose 70 - 99 mg/dL 891  875  890   BUN 6 - 20 mg/dL 8  10  8     Creatinine 0.44 - 1.00 mg/dL 9.35  9.34  9.35   Sodium 135 - 145 mmol/L 139  141  139   Potassium 3.5 - 5.1 mmol/L 3.4  3.1  3.5   Chloride 98 - 111 mmol/L 109  107  109   CO2 22 - 32 mmol/L 21  26  23    Calcium 8.9 - 10.3 mg/dL 9.0  8.5  8.0       Latest Ref Rng & Units 07/24/2024    4:54 AM 08/17/2023   10:20 PM 06/23/2023   11:18 AM  CBC  WBC 4.0 - 10.5 K/uL 7.1  6.1  6.6   Hemoglobin 12.0 - 15.0 g/dL 86.9  86.9  87.6   Hematocrit 36.0 - 46.0 % 38.6  39.6  37.5   Platelets 150 - 400 K/uL 227  173  188     Family Communication: No family at bedside.  Disposition: Status is: Inpatient Remains inpatient appropriate because: Continue IV therapy.  Pending further medical stabilization by psychiatry service.  For now anticipated plan is to discharge to psych facility for further treatment once medically clear.  Time spent: 50 minutes  Author: Adriana DELENA Grams, MD 07/27/2024 10:03 AM  For on call review [email or web address].

## 2024-07-27 NOTE — Progress Notes (Signed)
 CSW attempted to meet with patient for completion of PSA, however, patient was unavailable at the time. CSW to meet with patient at a later time.  Hermann Dottavio, LCSWA 07/27/24 2:52PM

## 2024-07-28 ENCOUNTER — Encounter (HOSPITAL_COMMUNITY): Payer: Self-pay

## 2024-07-28 DIAGNOSIS — F209 Schizophrenia, unspecified: Secondary | ICD-10-CM

## 2024-07-28 LAB — URINALYSIS, COMPLETE (UACMP) WITH MICROSCOPIC
Bilirubin Urine: NEGATIVE
Glucose, UA: NEGATIVE mg/dL
Hgb urine dipstick: NEGATIVE
Ketones, ur: NEGATIVE mg/dL
Leukocytes,Ua: NEGATIVE
Nitrite: NEGATIVE
Protein, ur: NEGATIVE mg/dL
Specific Gravity, Urine: 1.023 (ref 1.005–1.030)
pH: 7 (ref 5.0–8.0)

## 2024-07-28 LAB — VITAMIN B12: Vitamin B-12: 1211 pg/mL — ABNORMAL HIGH (ref 180–914)

## 2024-07-28 LAB — LIPID PANEL
Cholesterol: 259 mg/dL — ABNORMAL HIGH (ref 0–200)
HDL: 58 mg/dL (ref >40)
LDL Cholesterol: 182 mg/dL — ABNORMAL HIGH (ref 0–99)
Total CHOL/HDL Ratio: 4.5 ratio
Triglycerides: 93 mg/dL (ref <150)
VLDL: 19 mg/dL (ref 0–40)

## 2024-07-28 MED ORDER — HYDROCHLOROTHIAZIDE 25 MG PO TABS
25.0000 mg | ORAL_TABLET | Freq: Every day | ORAL | Status: DC
  Administered 2024-07-28 – 2024-07-30 (×3): 25 mg via ORAL
  Filled 2024-07-28 (×3): qty 1

## 2024-07-28 MED ORDER — CLONIDINE HCL 0.1 MG PO TABS
0.2000 mg | ORAL_TABLET | Freq: Every day | ORAL | Status: DC
  Administered 2024-07-28 – 2024-07-29 (×2): 0.2 mg via ORAL
  Filled 2024-07-28 (×2): qty 2

## 2024-07-28 MED ORDER — POTASSIUM CHLORIDE CRYS ER 20 MEQ PO TBCR: 40.0000 meq | EXTENDED_RELEASE_TABLET | Freq: Once | ORAL | Status: DC

## 2024-07-28 NOTE — BH IP Treatment Plan (Signed)
 Interdisciplinary Treatment and Diagnostic Plan Update  07/28/2024 Time of Session: 11:15 AM Rachel Ray MRN: [id number]  Principal Diagnosis: Schizophrenia [facility])  Secondary Diagnoses: Principal Problem:   Schizophrenia (HCC)   Current Medications:  Current Facility-Administered Medications  Medication Dose Route Frequency Provider Last Rate Last Admin   acetaminophen  (TYLENOL ) tablet 650 mg  650 mg Oral Q6H PRN Randall Starlyn CHRISTELLA, NP   650 mg at 07/27/24 2329   alum & mag hydroxide-simeth (MAALOX/MYLANTA) 200-200-20 MG/5ML suspension 30 mL  30 mL Oral Q4H PRN Randall Starlyn CHRISTELLA, NP       cloNIDine  (CATAPRES ) tablet 0.2 mg  0.2 mg Oral QHS Bouchard, Marc A, DO       haloperidol  (HALDOL ) tablet 5 mg  5 mg Oral TID PRN Randall Starlyn CHRISTELLA, NP       And   diphenhydrAMINE  (BENADRYL ) capsule 50 mg  50 mg Oral TID PRN Randall Starlyn CHRISTELLA, NP       haloperidol  lactate (HALDOL ) injection 5 mg  5 mg Intramuscular TID PRN Randall Starlyn CHRISTELLA, NP       And   diphenhydrAMINE  (BENADRYL ) injection 50 mg  50 mg Intramuscular TID PRN Randall Starlyn CHRISTELLA, NP       And   LORazepam  (ATIVAN ) injection 2 mg  2 mg Intramuscular TID PRN Randall Starlyn CHRISTELLA, NP       haloperidol  lactate (HALDOL ) injection 10 mg  10 mg Intramuscular TID PRN Randall Starlyn CHRISTELLA, NP       And   diphenhydrAMINE  (BENADRYL ) injection 50 mg  50 mg Intramuscular TID PRN Randall Starlyn CHRISTELLA, NP       And   LORazepam  (ATIVAN ) injection 2 mg  2 mg Intramuscular TID PRN Randall Starlyn CHRISTELLA, NP       gabapentin  (NEURONTIN ) capsule 100 mg  100 mg Oral TID Byungura, Veronique M, NP   100 mg at 07/28/24 1626   hydrochlorothiazide  (HYDRODIURIL ) tablet 25 mg  25 mg Oral Daily Prentis Kitchens A, DO   25 mg at 07/28/24 1626   hydrOXYzine  (ATARAX ) tablet 25 mg  25 mg Oral TID PRN Randall Starlyn CHRISTELLA, NP   25 mg at 07/27/24 2033   magnesium  hydroxide (MILK OF MAGNESIA) suspension 30 mL  30 mL Oral Daily PRN Byungura,  Veronique M, NP       melatonin tablet 6 mg  6 mg Oral QHS Byungura, Veronique M, NP   6 mg at 07/27/24 2033   nicotine  (NICODERM CQ  - dosed in mg/24 hours) patch 14 mg  14 mg Transdermal Daily Prentis Kitchens A, DO   14 mg at 07/28/24 9188   ondansetron  (ZOFRAN -ODT) disintegrating tablet 8 mg  8 mg Oral Q8H PRN Byungura, Veronique M, NP       QUEtiapine  (SEROQUEL ) tablet 100 mg  100 mg Oral q AM Randall, Veronique M, NP   100 mg at 07/28/24 9190   QUEtiapine  (SEROQUEL ) tablet 400 mg  400 mg Oral QHS Randall Starlyn M, NP   400 mg at 07/27/24 2033   PTA Medications: Medications Prior to Admission  Medication Sig Dispense Refill Last Dose/Taking   Buprenorphine HCl-Naloxone HCl 2-0.5 MG FILM Place 1 Film under the tongue daily.      gabapentin  (NEURONTIN ) 100 MG capsule Take 1 capsule (100 mg total) by mouth 3 (three) times daily. 90 capsule 0    hydrOXYzine  (ATARAX ) 25 MG tablet Take 1 tablet (25 mg total) by mouth 3 (three) times daily as needed for anxiety. 30  tablet 0    melatonin 3 MG TABS tablet Take 1 tablet (3 mg total) by mouth at bedtime as needed.      metFORMIN  (GLUCOPHAGE -XR) 500 MG 24 hr tablet Take 1 tablet (500 mg total) by mouth daily with breakfast. 30 tablet 0    potassium chloride  SA (KLOR-CON  M) 20 MEQ tablet Take 1 tablet (20 mEq total) by mouth daily. 30 tablet 0    QUEtiapine  (SEROQUEL ) 100 MG tablet Take 1 tablet (100 mg total) by mouth in the morning. 30 tablet 0    QUEtiapine  (SEROQUEL ) 400 MG tablet Take 1 tablet (400 mg total) by mouth at bedtime. 30 tablet 0     Patient Stressors: Financial difficulties   Health problems   Marital or family conflict    Patient Strengths: Ability for Contractor for treatment/growth   Treatment Modalities: Medication Management, Group therapy, Case management,  1 to 1 session with clinician, Psychoeducation, Recreational therapy.   Physician Treatment Plan for Primary Diagnosis:  Schizophrenia (HCC) Long Term Goal(s): Improvement in symptoms so as ready for discharge   Short Term Goals: Ability to identify changes in lifestyle to reduce recurrence of condition will improve Ability to verbalize feelings will improve Ability to disclose and discuss suicidal ideas Ability to demonstrate self-control will improve Ability to identify and develop effective coping behaviors will improve Ability to maintain clinical measurements within normal limits will improve Compliance with prescribed medications will improve Ability to identify triggers associated with substance abuse/mental health issues will improve  Medication Management: Evaluate patient's response, side effects, and tolerance of medication regimen.  Therapeutic Interventions: 1 to 1 sessions, Unit Group sessions and Medication administration.  Evaluation of Outcomes: Not Progressing  Physician Treatment Plan for Secondary Diagnosis: Principal Problem:   Schizophrenia (HCC)  Long Term Goal(s): Improvement in symptoms so as ready for discharge   Short Term Goals: Ability to identify changes in lifestyle to reduce recurrence of condition will improve Ability to verbalize feelings will improve Ability to disclose and discuss suicidal ideas Ability to demonstrate self-control will improve Ability to identify and develop effective coping behaviors will improve Ability to maintain clinical measurements within normal limits will improve Compliance with prescribed medications will improve Ability to identify triggers associated with substance abuse/mental health issues will improve     Medication Management: Evaluate patient's response, side effects, and tolerance of medication regimen.  Therapeutic Interventions: 1 to 1 sessions, Unit Group sessions and Medication administration.  Evaluation of Outcomes: Not Progressing   RN Treatment Plan for Primary Diagnosis: Schizophrenia (HCC) Long Term Goal(s):  Knowledge of disease and therapeutic regimen to maintain health will improve  Short Term Goals: Ability to remain free from injury will improve, Ability to verbalize frustration and anger appropriately will improve, Ability to demonstrate self-control, Ability to participate in decision making will improve, Ability to verbalize feelings will improve, Ability to disclose and discuss suicidal ideas, Ability to identify and develop effective coping behaviors will improve, and Compliance with prescribed medications will improve  Medication Management: RN will administer medications as ordered by provider, will assess and evaluate patient's response and provide education to patient for prescribed medication. RN will report any adverse and/or side effects to prescribing provider.  Therapeutic Interventions: 1 on 1 counseling sessions, Psychoeducation, Medication administration, Evaluate responses to treatment, Monitor vital signs and CBGs as ordered, Perform/monitor CIWA, COWS, AIMS and Fall Risk screenings as ordered, Perform wound care treatments as ordered.  Evaluation of Outcomes: Not Progressing  LCSW Treatment Plan for Primary Diagnosis: Schizophrenia (HCC) Long Term Goal(s): Safe transition to appropriate next level of care at discharge, Engage patient in therapeutic group addressing interpersonal concerns.  Short Term Goals: Engage patient in aftercare planning with referrals and resources, Increase social support, Increase ability to appropriately verbalize feelings, Increase emotional regulation, Facilitate acceptance of mental health diagnosis and concerns, Facilitate patient progression through stages of change regarding substance use diagnoses and concerns, Identify triggers associated with mental health/substance abuse issues, and Increase skills for wellness and recovery  Therapeutic Interventions: Assess for all discharge needs, 1 to 1 time with Social worker, Explore available resources  and support systems, Assess for adequacy in community support network, Educate family and significant other(s) on suicide prevention, Complete Psychosocial Assessment, Interpersonal group therapy.  Evaluation of Outcomes: Not Progressing   Progress in Treatment: Attending groups: attended some groups Participating in groups: Yes. Taking medication as prescribed: Yes. Toleration medication: Yes. Family/Significant other contact made: No, will contact:  Rumalda Mulch (mother) [phone] Patient understands diagnosis: No. Discussing patient identified problems/goals with staff: No. Medical problems stabilized or resolved: Yes. Denies suicidal/homicidal ideation: Yes. Issues/concerns per patient self-inventory: No.  New problem(s) identified:  No  New Short Term/Long Term Goal(s):    medication stabilization, elimination of SI thoughts, development of comprehensive mental wellness plan.    Patient Goals:  I want to adjust my meds   Discharge Plan or Barriers:  Patient recently admitted. CSW will continue to follow and assess for appropriate referrals and possible discharge planning.     Reason for Continuation of Hospitalization: Anxiety Depression Medication stabilization  Estimated Length of Stay:  5 - 7 days  Last 3 [place] Suicide Severity Risk Score: Flowsheet Row [facility] (Current) [facility]-Admission (Discharged) from 07/24/2024 in [facility] from [facility]/2024 [facility]  C-SSRS RISK CATEGORY No Risk No Risk No Risk    Last PHQ 2/9 Scores:    04/24/2023   12:15 PM 10/20/2016    3:04 PM  Depression screen PHQ 2/9  Decreased Interest 1 0  Down, Depressed, Hopeless 1 0  PHQ - 2 Score 2 0  Altered sleeping 1   Tired, decreased energy 1   Change in appetite 0   Feeling bad or failure about yourself  0   Trouble concentrating 1   Moving slowly or  fidgety/restless 0   Suicidal thoughts 0   PHQ-9 Score 5   Difficult doing work/chores Somewhat difficult     Scribe for Treatment Team: Oaklen Thiam O Javaeh Muscatello, LCSWA 07/28/2024 5:56 PM

## 2024-07-28 NOTE — [facility] Group Notes (Signed)
 Spirituality Group   Description: Participant directed exploration of values, beliefs and meaning   Following a brief framework of chaplain's role and ground rules of group behavior, participants are invited to share concerns or questions that engage spiritual life. Emphasis placed on common themes and shared experiences and ways to make meaning and clarify living into one's values.   Theory/Process/Goal: Utilize the theoretical framework of group therapy established by Celena Kite, Relational Cultural Theory and Rogerian approaches to facilitate relational empathy and use of the "here and now" to foster reflection, self-awareness, and sharing.   Observations: Rachel Ray was an active participant in the group discussion. She drew upon personal experience to engage in meaning making. Engaged peers with empathy. Helped to foster trust and mutual empathy among peers.  Larena Ohnemus L. Delores HERO.Div
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
## 2024-07-28 NOTE — Plan of Care (Signed)
  Problem: Activity: Goal: Interest or engagement in activities will improve Outcome: Progressing Goal: Sleeping patterns will improve Outcome: Progressing   Problem: Coping: Goal: Ability to verbalize frustrations and anger appropriately will improve Outcome: Progressing Goal: Ability to demonstrate self-control will improve Outcome: Progressing   Problem: Safety: Goal: Periods of time without injury will increase Outcome: Progressing

## 2024-07-28 NOTE — Progress Notes (Signed)
   07/28/24 2159  Psych Admission Type (Psych Patients Only)  Admission Status Voluntary  Psychosocial Assessment  Patient Complaints Depression  Eye Contact Fair  Facial Expression Animated  Affect Appropriate to circumstance  Speech Logical/coherent  Interaction Assertive  Motor Activity Slow  Appearance/Hygiene Unremarkable  Behavior Characteristics Appropriate to situation  Mood Pleasant  Thought Process  Coherency WDL  Content WDL  Delusions None reported or observed  Perception WDL  Hallucination None reported or observed  Judgment WDL  Confusion WDL  Danger to Self  Current suicidal ideation? Denies  Danger to Others  Danger to Others None reported or observed

## 2024-07-28 NOTE — Progress Notes (Signed)
   07/28/24 0900  Psych Admission Type (Psych Patients Only)  Admission Status Voluntary  Psychosocial Assessment  Patient Complaints Depression  Eye Contact Fair  Facial Expression Animated  Affect Appropriate to circumstance  Speech Logical/coherent  Interaction Assertive  Motor Activity Slow  Appearance/Hygiene Unremarkable  Behavior Characteristics Cooperative  Mood Depressed;Pleasant  Thought Process  Coherency WDL  Content WDL  Delusions None reported or observed  Perception WDL  Hallucination None reported or observed  Judgment WDL  Confusion WDL  Danger to Self  Current suicidal ideation? Denies  Danger to Others  Danger to Others None reported or observed

## 2024-07-28 NOTE — Group Note (Signed)
 Date:  07/28/2024 Time:  9:20 PM  Group Topic/Focus:  Wrap-Up Group:   The focus of this group is to help patients review their daily goal of treatment and discuss progress on daily workbooks.    Additional Comments:  Pt attended the AA meeting without disruption.  Rachel Ray 07/28/2024, 9:20 PM

## 2024-07-28 NOTE — Group Note (Signed)
 Recreation Therapy Group Note   Group Topic:Coping Skills  Group Date: 07/28/2024 Start Time: 1015 End Time: 1045 Facilitators: Sabrine Patchen-McCall, LRT,CTRS Location: [address] Dayroom   Group Topic: Coping Skills    Goal Area(s) Addresses: Patient will define what a coping skill is. Patient will create a list of healthy coping skills beginning with each letter of the alphabet. Patient will successfully identify positive coping skills they can use post d/c.  Patient will acknowledge benefit(s) of using learned coping skills post d/c.   Behavioral Response:    Intervention: Worksheet   Activity: Coping A to Z. Patient asked to identify what a coping skill is and when they use them. Patients with Clinical research associate discussed healthy versus unhealthy coping skills. Next patients were given a blank worksheet titled Coping Skills A-Z. Patients were instructed to come up with at least one positive coping skill per letter of the alphabet.Patients were given 15 minutes to brainstorm before ideas were presented to the large group. Patients and LRT debriefed on the importance of coping skill selection based on situation and back-up plans when a skill tried is not effective. At the end of group, patients were given an handout of alphabetized strategies to keep for future reference.   Education: Pharmacologist, Scientist, physiological, Discharge Planning.    Education Outcome: Acknowledges education/Verbalizes understanding/In group clarification offered/Additional education needed   Affect/Mood: N/A   Participation Level: Did not attend    Clinical Observations/Individualized Feedback:      Plan: Continue to engage patient in RT group sessions 2-3x/week.   Rimas Gilham-McCall, LRT,CTRS 07/28/2024 12:57 PM

## 2024-07-28 NOTE — [facility] Suicide Risk Assessment (Signed)
 Suicide Risk Assessment  Admission Assessment    [facility] Admission Suicide Risk Assessment  Nursing information obtained from:  Patient Demographic factors:  Unemployed, Low socioeconomic status Current Mental Status:  NA Loss Factors:  Financial problems / change in socioeconomic status Historical Factors:  NA Risk Reduction Factors:  Living with another person, especially a relative  Total Time spent with patient: 45 minutes  Principal Problem: Schizophrenia (HCC) Diagnosis:  Principal Problem:   Schizophrenia (HCC)  Subjective Data: Rachel Ray is a 43 year old African-American female with prior psychiatric history significant for schizophrenia, schizoaffective disorder bipolar type, and auditory hallucinations, who presents voluntarily to [facility] from [facility] for complaint of worsening anxiety, poor sleep x 3 days, and auditory hallucinations in the context of not taking her Seroquel  for 2 to 3 days.  BAL less than 15, UDS positive for benzodiazepine.  Continued Clinical Symptoms:  Alcohol Use Disorder Identification Test Final Score (AUDIT): 0 The Alcohol Use Disorders Identification Test, Guidelines for Use in Primary Care, Second Edition.  World Science writer [facility]). Score between 0-7:  no or low risk or alcohol related problems. Score between 8-15:  moderate risk of alcohol related problems. Score between 16-19:  high risk of alcohol related problems. Score 20 or above:  warrants further diagnostic evaluation for alcohol dependence and treatment.  CLINICAL FACTORS:   Severe Anxiety and/or Agitation Depression:   Anhedonia Insomnia Severe Alcohol/Substance Abuse/Dependencies Schizophrenia:   Depressive state Paranoid or undifferentiated type More than one psychiatric diagnosis Unstable or Poor Therapeutic Relationship Previous Psychiatric Diagnoses and Treatments Medical Diagnoses and  Treatments/Surgeries  Musculoskeletal: Strength & Muscle Tone: within normal limits Gait & Station: normal Patient leans: N/A  Psychiatric Specialty Exam:  Presentation  General Appearance:  Appropriate for Environment; Casual  Eye Contact: Fair  Speech: Clear and Coherent; Slow  Speech Volume: Normal  Handedness: Right  Mood and Affect  Mood: Anxious  Affect: Appropriate; Congruent  Thought Process  Thought Processes: Linear  Descriptions of Associations:Intact  Orientation:Full (Time, Place and Person)  Thought Content:WDL  History of Schizophrenia/Schizoaffective disorder:Yes  Duration of Psychotic Symptoms:Greater than six months  Hallucinations:Hallucinations: None  Ideas of Reference:None  Suicidal Thoughts:Suicidal Thoughts: No  Homicidal Thoughts:Homicidal Thoughts: No  Sensorium  Memory: Immediate Fair; Recent Fair  Judgment: Fair  Insight: Fair  Art therapist  Concentration: Fair  Attention Span: Fair  Recall: [employer] of Knowledge: Fair  Language: Fair  Psychomotor Activity  Psychomotor Activity: Psychomotor Activity: Normal  Assets  Assets: Communication Skills; Desire for Improvement; Housing; Physical Health; Resilience  Sleep  Sleep: Sleep: Poor Number of Hours of Sleep: 3.25  Physical Exam: Physical Exam Vitals and nursing note reviewed.  Constitutional:      General: She is not in acute distress.    Appearance: She is obese. She is not ill-appearing.  HENT:     Head: Normocephalic.     Right Ear: External ear normal.     Left Ear: External ear normal.     Nose: Nose normal.     Mouth/Throat:     Mouth: Mucous membranes are moist.     Pharynx: Oropharynx is clear.  Eyes:     Extraocular Movements: Extraocular movements intact.  Cardiovascular:     Rate and Rhythm: Normal rate.     Pulses: Normal pulses.  Pulmonary:     Effort: Pulmonary effort is normal. No respiratory distress.   Abdominal:     Comments: Deferred  Genitourinary:  Comments: Deferred Musculoskeletal:        General: Normal range of motion.     Cervical back: Normal range of motion.  Skin:    General: Skin is dry.  Neurological:     Mental Status: She is alert and oriented to person, place, and time.  Psychiatric:        Mood and Affect: Mood normal.        Behavior: Behavior normal.    Review of Systems  Constitutional:  Negative for chills and fever.  HENT:  Negative for sore throat.   Eyes:  Negative for blurred vision.  Respiratory:  Negative for cough, sputum production, shortness of breath and wheezing.   Cardiovascular:  Negative for chest pain and palpitations.  Gastrointestinal:  Negative for abdominal pain, constipation, diarrhea, heartburn, nausea and vomiting.  Genitourinary:  Negative for dysuria.  Musculoskeletal:  Negative for falls.  Skin:  Negative for itching and rash.  Neurological:  Negative for dizziness and headaches.  Endo/Heme/Allergies:        See allergy listing  Psychiatric/Behavioral:  Positive for depression, hallucinations and substance abuse. Negative for suicidal ideas. The patient is nervous/anxious and has insomnia.    Blood pressure (!) 132/93, pulse 60, temperature 98.8 F (37.1 C), temperature source Oral, resp. rate 18, height 5' 2 (1.575 m), weight 125.6 kg, last menstrual period 03/13/2016, SpO2 100%. Body mass index is 50.66 kg/m.  COGNITIVE FEATURES THAT CONTRIBUTE TO RISK:  Polarized thinking    SUICIDE RISK:   Severe:  Frequent, intense, and enduring suicidal ideation, specific plan, no subjective intent, but some objective markers of intent (i.e., choice of lethal method), the method is accessible, some limited preparatory behavior, evidence of impaired self-control, severe dysphoria/symptomatology, multiple risk factors present, and few if any protective factors, particularly a lack of social support.  PLAN OF CARE: Treatment Plan  Summary: Daily contact with patient to assess and evaluate symptoms and progress in treatment and Medication management  Observation Level/Precautions:  15 minute checks  Laboratory:   CBC: Within normal limits. Chemistry Profile: Potassium 3.4, CO2 21, glucose 108, otherwise normal. BMP: Ordered Folic Acid: Ordered Hemoglobin A1c: Ordered TSH: 1.430 Lipid panel: Ordered Vitamin D 25-hydroxy: Ordered HCG: Not applicable, history of hysterectomy UDS: Positive for benzodiazepines UA: Ordered Vitamin B-12: Ordered  EKG: Sinus rhythm with premature ventricular complexes, ventricular rate 74, QT/QTc 400/444  Psychotherapy: Therapeutic Milieu  Medications: See MAR  Consultations: Pending  Discharge Concerns: Safety  Estimated LOS: 3 to 7 days  Other:     Assessment: Rachel Ray is a 43 year old African-American female with prior psychiatric history significant for schizophrenia, schizoaffective disorder bipolar type, and auditory hallucinations, who presents voluntarily to [facility] from [facility] for complaint of worsening anxiety, poor sleep x 3 days, and auditory hallucinations in the context of not taking her Seroquel  for 2 to 3 days.  BAL less than 15, UDS positive for benzodiazepine.  Physician Treatment Plan for Primary Diagnosis: Schizophrenia (HCC)  Plans: Medications: --QUEtiapine  (SEROQUEL ) tablet 400 mg 400 mg, Oral, Daily at bedtime for psychosis --QUEtiapine  (SEROQUEL ) tablet 100 mg 100 mg, Oral, Every morning, --Gabapentin  (NEURONTIN ) 100 MG capsule Take 1 capsule (100 mg total) by mouth 3 (three) times daily. -- Hydroxyzine  tablet 25 mg p.o. 3 times daily as needed for anxiety  Continue [facility]HH agitation protocol  Medication for other medical problems: --Nicotine  Patch Transdermal 14 mg Q24 hours for smoking cessation --Melatonin tablet 6 mg po Q nightly for  Insomnia -- Clonidine  tablets 0.2 mg p.o. daily nightly for elevated  blood pressure --Hydrochlorothiazide  25 mg p.o. daily for high blood pressure  Other PRN Medications  -Acetaminophen  650 mg every 6 as needed/mild pain  -Maalox 30 mL oral every 4 as needed/digestion  -Magnesium  hydroxide 30 mL daily as needed/mild constipation  -- Zofran  disintegrating tablets 8 mg every 8 hours as needed for nausea or vomiting  --The risks/benefits/side-effects/alternatives to this medication were discussed in detail with the patient and time was given for questions. The patient consents to medication trial.   -- Metabolic profile and EKG monitoring obtained while on an atypical antipsychotic (BMI: Lipid Panel: HbgA1c: QTc:)   -- Encouraged patient to participate in unit milieu and in scheduled group therapies   Safety and Monitoring:  Voluntary admission to inpatient psychiatric unit for safety, stabilization and treatment  Daily contact with patient to assess and evaluate symptoms and progress in treatment  Patient's case to be discussed in multi-disciplinary team meeting  Observation Level : q15 minute checks  Vital signs: q12 hours  Precautions: suicide, but pt currently verbally contracts for safety on unit?   Discharge Planning:  Social work and case management to assist with discharge planning and identification of hospital follow-up needs prior to discharge  Estimated LOS: 5-7?days  Discharge Concerns: Need to establish Safety plan; Medication compliance and effectiveness  Discharge Goals: Return home with outpatient referrals for mental health follow-up including medication management/psychotherapy.   Long Term Goal(s): Improvement in symptoms so as ready for discharge  Short Term Goals: Ability to identify changes in lifestyle to reduce recurrence of condition will improve, Ability to verbalize feelings will improve, Ability to disclose and discuss suicidal ideas, Ability to demonstrate self-control will improve, Ability to identify and develop effective  coping behaviors will improve, Ability to maintain clinical measurements within normal limits will improve, Compliance with prescribed medications will improve, and Ability to identify triggers associated with substance abuse/mental health issues will improve  Physician Treatment Plan for Secondary Diagnosis: Principal Problem:   Schizophrenia (HCC)  I certify that inpatient services furnished can reasonably be expected to improve the patient's condition.   Darrien Laakso C Josanna Hefel, FNP 07/28/2024, 1:29 PM

## 2024-07-28 NOTE — Progress Notes (Signed)
 Pt reports extreme anxiety, related earlier about the size of the unit out here rather than on the smaller [address].  Pt given medication for her agitation and anxiety per protocol.  Pt had felt unable to rest in her room.  Will continue to monitor closely.
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
## 2024-07-29 LAB — BASIC METABOLIC PANEL WITH GFR
Anion gap: 10 (ref 5–15)
BUN: 9 mg/dL (ref 6–20)
CO2: 25 mmol/L (ref 22–32)
Calcium: 9.2 mg/dL (ref 8.9–10.3)
Chloride: 105 mmol/L (ref 98–111)
Creatinine, Ser: 0.79 mg/dL (ref 0.44–1.00)
GFR, Estimated: >60 mL/min (ref >60)
Glucose, Bld: 119 mg/dL — ABNORMAL HIGH (ref 70–99)
Potassium: 3.8 mmol/L (ref 3.5–5.1)
Sodium: 139 mmol/L (ref 135–145)

## 2024-07-29 LAB — VITAMIN D 25 HYDROXY (VIT D DEFICIENCY, FRACTURES): Vit D, 25-Hydroxy: 33 ng/mL (ref 30–100)

## 2024-07-29 MED ORDER — RISPERIDONE 1 MG PO TBDP: 1.0000 mg | ORAL_TABLET | Freq: Two times a day (BID) | ORAL | Status: DC

## 2024-07-29 MED ORDER — LORAZEPAM 1 MG PO TABS
1.0000 mg | ORAL_TABLET | Freq: Once | ORAL | Status: AC
  Administered 2024-07-29: 1 mg via ORAL
  Filled 2024-07-29: qty 1

## 2024-07-29 MED ORDER — NICOTINE POLACRILEX 2 MG MT GUM
2.0000 mg | CHEWING_GUM | OROMUCOSAL | Status: DC | PRN
  Filled 2024-07-29: qty 1

## 2024-07-29 MED ORDER — ZOLPIDEM TARTRATE 5 MG PO TABS
5.0000 mg | ORAL_TABLET | Freq: Every evening | ORAL | Status: DC | PRN
  Administered 2024-07-29: 5 mg via ORAL
  Filled 2024-07-29: qty 1

## 2024-07-29 NOTE — Progress Notes (Signed)
 Pt continues to report anxiety, hallucinations.  Pt states she feels like something is on her body.  Pt has already received agitation medication including Haldol  5 mg and Benadryl  50 mg.  Pt stated she will try to pick a book and read in her room.  Will continue to monitor.

## 2024-07-29 NOTE — Progress Notes (Signed)
 Notified NP of Pt's continuing agitation and new order received for Ativan  1 mg.  Pt in room singing with all lights on.  Pt states I'm in a spiritual battle, you know that?.  Pt had to be encouraged to take medication and did eventually appear to do so.  Will continue to monitor. Spoke with [facility] about possibly moving Pt back to [employer], Pt appears to be very uncomfortable over here, mentioned it several times being big and bright.

## 2024-07-29 NOTE — Group Note (Signed)
 Date:  07/29/2024 Time:  9:16 AM  Group Topic/Focus:  Goals Group:   The focus of this group is to help patients establish daily goals to achieve during treatment and discuss how the patient can incorporate goal setting into their daily lives to aide in recovery.    Participation Level:  Did Not Attend  Participation Quality:  NA  Affect:  NA  Cognitive:  NA  Insight: None  Engagement in Group:  NA  Modes of Intervention:  NA  Additional Comments:  NA  Teria Khachatryan A Mick Tanguma 07/29/2024, 9:16 AM

## 2024-07-29 NOTE — Group Note (Signed)
 Date:  07/29/2024 Time:  4:17 PM  Group Topic/Focus:  Managing Feelings:   The focus of this group is to identify what feelings patients have difficulty handling and develop a plan to handle them in a healthier way upon discharge.    Participation Level:  Did Not Attend   Huel Mall 07/29/2024, 4:17 PM

## 2024-07-29 NOTE — [facility] Group Notes (Signed)
 [facility] Group Notes:  (Nursing/MHT/Case Management/Adjunct)  Date:  07/29/2024  Time:  9:27 PM  Type of Therapy:  Wrap-up group  Participation Level:  Active  Participation Quality:  Appropriate  Affect:  Appropriate  Cognitive:  Appropriate  Insight:  Appropriate  Engagement in Group:  Engaged  Modes of Intervention:  Education  Summary of Progress/Problems: Goal to get rest. Rated day 6/10.  Rachel Ray Essex 07/29/2024, 9:27 PM

## 2024-07-29 NOTE — Progress Notes (Signed)
(  Sleep Hours) - 3 hours (Any PRNs that were needed, meds refused, or side effects to meds)-  Vistaril , Haldol , Benadryl , Ativan  1 mg (Any disturbances and when (visitation, over night)- Yes, hallucinations (Concerns raised by the patient)-  Hallucinations,  high anxiety (SI/HI/AVH)-  Auditory hallucinaitons

## 2024-07-29 NOTE — Progress Notes (Signed)
 [facility] Inpatient Psychiatry Progress Note  Date: 07/29/24 Patient: Rachel Ray MRN: [id number]  Assessment and Plan: Rachel Ray is a 43 year old African-American female with prior psychiatric history significant for schizophrenia, schizoaffective disorder bipolar type, and auditory hallucinations, who presents voluntarily to [facility] from [place] for complaint of worsening anxiety, poor sleep x 3 days, and auditory hallucinations in the context of not taking her psychotropic medication for 2 to 3 days.   9/6 - Significant improvement since admission. Much less overt psychosis and improving insight. Tolerating her medications well. Planning for discharge tomorrow.   # Schizophrenia (HCC) - Quetiapine  100/400 mg BID  # Rhabdoymyloysis - Improving. Encouraging fluids.    Risk Assessment - Low  Discharge Planning Estimated length of stay: 1 day Predicted Discharge location: Home     Interval History and update: Chart reviewed. No significant events overnight. Patient has been med compliant. No significant medication side effects. No EPS. Patient reported not sleeping well last night. She states that she is feeling well and denies any significant anxiety or depressed mood. Denies SI. She stated that she still sometimes experiences odd spiritual experiences that she is hesitant to share with others because It sounds crazy. She denies AVH however. She would like to discharge home tomorrow and return to [place] where she lives with her son.       Physical Exam MSK/Neuro - Normal gait and station Mental Status Exam Appearance - Casually dressed, obese, NAD Attitude - Pleasant, calm, polite Speech - normal volume, prosody, inflection Mood - Okay Affect - Restricted Thought Process - Linear, GD, mostly logical Thought Content - States that sometimes she will have odd spiritual feelings, but otherwise does  not endorse any grossly paranoid or delusional TC SI/HI - Denies Perceptions - Denies AVH; not RIS Judgement/Insight - Fair Fund of knowledge - WNL Language - No impairments      Lab Results:  Admission on 07/27/2024  Component Date Value Ref Range Status   Vit D, 25-Hydroxy 07/28/2024 33.00  30 - 100 ng/mL Final   Vitamin B-12 07/28/2024 1,211 (H)  180 - 914 pg/mL Final   Color, Urine 07/28/2024 YELLOW  YELLOW Final   APPearance 07/28/2024 CLEAR  CLEAR Final   Specific Gravity, Urine 07/28/2024 1.023  1.005 - 1.030 Final   pH 07/28/2024 7.0  5.0 - 8.0 Final   Glucose, UA 07/28/2024 NEGATIVE  NEGATIVE mg/dL Final   Hgb urine dipstick 07/28/2024 NEGATIVE  NEGATIVE Final   Bilirubin Urine 07/28/2024 NEGATIVE  NEGATIVE Final   Ketones, ur 07/28/2024 NEGATIVE  NEGATIVE mg/dL Final   Protein, ur 90/94/7974 NEGATIVE  NEGATIVE mg/dL Final   Nitrite 90/94/7974 NEGATIVE  NEGATIVE Final   Leukocytes,Ua 07/28/2024 NEGATIVE  NEGATIVE Final   RBC / HPF 07/28/2024 0-5  0 - 5 RBC/hpf Final   WBC, UA 07/28/2024 0-5  0 - 5 WBC/hpf Final   Bacteria, UA 07/28/2024 RARE (A)  NONE SEEN Final   Squamous Epithelial / HPF 07/28/2024 0-5  0 - 5 /HPF Final   Mucus 07/28/2024 PRESENT   Final   Cholesterol 07/28/2024 259 (H)  0 - 200 mg/dL Final   Triglycerides 90/94/7974 93  <150 mg/dL Final   HDL 90/94/7974 58  >40 mg/dL Final   Total CHOL/HDL Ratio 07/28/2024 4.5  RATIO Final   VLDL 07/28/2024 19  0 - 40 mg/dL Final   LDL Cholesterol 07/28/2024 182 (H)  0 - 99 mg/dL Final  Sodium 07/29/2024 139  135 - 145 mmol/L Final   Potassium 07/29/2024 3.8  3.5 - 5.1 mmol/L Final   Chloride 07/29/2024 105  98 - 111 mmol/L Final   CO2 07/29/2024 25  22 - 32 mmol/L Final   Glucose, Bld 07/29/2024 119 (H)  70 - 99 mg/dL Final   BUN 90/93/7974 9  6 - 20 mg/dL Final   Creatinine, Ser 07/29/2024 0.79  0.44 - 1.00 mg/dL Final   Calcium 90/93/7974 9.2  8.9 - 10.3 mg/dL Final   GFR, Estimated 07/29/2024 >60  >60  mL/min Final   Anion gap 07/29/2024 10  5 - 15 Final     Vitals: Blood pressure 111/77, pulse 66, temperature (!) 97.4 F (36.3 C), temperature source Oral, resp. rate 18, height 5' 2 (1.575 m), weight 125.6 kg, last menstrual period 03/13/2016, SpO2 100%.    Oliva DELENA Salmon, DO

## 2024-07-29 NOTE — Progress Notes (Signed)
   07/29/24 0900  Psych Admission Type (Psych Patients Only)  Admission Status Voluntary  Psychosocial Assessment  Patient Complaints Depression  Eye Contact Fair  Facial Expression Animated  Affect Appropriate to circumstance  Speech Logical/coherent  Interaction Assertive  Motor Activity Slow  Appearance/Hygiene Unremarkable  Behavior Characteristics Appropriate to situation  Mood Pleasant  Thought Process  Coherency WDL  Content WDL  Delusions None reported or observed  Perception WDL  Hallucination None reported or observed  Judgment WDL  Confusion None  Danger to Self  Current suicidal ideation? Denies  Danger to Others  Danger to Others None reported or observed

## 2024-07-29 NOTE — Progress Notes (Signed)
   07/29/24 2036  Psych Admission Type (Psych Patients Only)  Admission Status Voluntary  Psychosocial Assessment  Patient Complaints Anxiety;Sleep disturbance;Insomnia  Eye Contact Fair  Facial Expression Animated  Affect Appropriate to circumstance  Speech Logical/coherent  Interaction Assertive  Motor Activity Other (Comment) (WDL)  Appearance/Hygiene Unremarkable  Behavior Characteristics Appropriate to situation  Mood Pleasant  Thought Process  Coherency WDL  Content WDL  Delusions None reported or observed  Perception WDL  Hallucination None reported or observed  Judgment Impaired  Confusion None  Danger to Self  Current suicidal ideation? Denies  Danger to Others  Danger to Others None reported or observed

## 2024-07-29 NOTE — Plan of Care (Signed)
  Problem: Activity: Goal: Interest or engagement in activities will improve Outcome: Progressing   Problem: Activity: Goal: Sleeping patterns will improve Outcome: Not Progressing

## 2024-07-29 NOTE — Plan of Care (Signed)
  Problem: Education: Goal: Knowledge of [facility] Education information/materials will improve Outcome: Progressing Goal: Verbalization of understanding the information provided will improve Outcome: Progressing   Problem: Activity: Goal: Interest or engagement in activities will improve Outcome: Progressing Goal: Sleeping patterns will improve Outcome: Progressing

## 2024-07-29 NOTE — [facility] Group Notes (Deleted)
 [facility] Group Notes:  (Nursing/MHT/Case Management/Adjunct)  Date:  07/29/2024  Time:  9:15 PM  Type of Therapy:  Wrap-up group  Participation Level:  Did Not Attend  Participation Quality:    Affect:    Cognitive:    Insight:    Engagement in Group:    Modes of Intervention:    Summary of Progress/Problems: Didn't attend.   Grayce LITTIE Essex 07/29/2024, 9:15 PM

## 2024-07-30 DIAGNOSIS — M6282 Rhabdomyolysis: Secondary | ICD-10-CM

## 2024-07-30 MED ORDER — QUETIAPINE FUMARATE 100 MG PO TABS: 100.0000 mg | ORAL_TABLET | Freq: Every morning | ORAL | 0 refills | Status: AC

## 2024-07-30 MED ORDER — HYDROCHLOROTHIAZIDE 25 MG PO TABS: 25.0000 mg | ORAL_TABLET | Freq: Every day | ORAL | 0 refills | Status: DC

## 2024-07-30 MED ORDER — CLONIDINE HCL 0.2 MG PO TABS: 0.2000 mg | ORAL_TABLET | Freq: Every day | ORAL | 0 refills | Status: AC

## 2024-07-30 MED ORDER — HYDROXYZINE HCL 25 MG PO TABS: 25.0000 mg | ORAL_TABLET | Freq: Three times a day (TID) | ORAL | 0 refills | Status: AC | PRN

## 2024-07-30 MED ORDER — METFORMIN HCL ER 500 MG PO TB24: 1000.0000 mg | ORAL_TABLET | Freq: Every day | ORAL | 0 refills | Status: AC

## 2024-07-30 MED ORDER — QUETIAPINE FUMARATE 400 MG PO TABS: 400.0000 mg | ORAL_TABLET | Freq: Every day | ORAL | 0 refills | Status: AC

## 2024-07-30 NOTE — Group Note (Signed)
 Date:  07/30/2024 Time:  9:21 AM  Group Topic/Focus:  Goals Group:   The focus of this group is to help patients establish daily goals to achieve during treatment and discuss how the patient can incorporate goal setting into their daily lives to aide in recovery.    Participation Level:  Active  Participation Quality:  Appropriate  Affect:  Appropriate  Cognitive:  Alert and Appropriate  Insight: Appropriate and Improving  Engagement in Group:  Engaged and Improving  Modes of Intervention:  Discussion and Exploration  Additional Comments:  Pt attended and participated in goals group.  Kristi HERO Rimas Gilham 07/30/2024, 9:21 AM

## 2024-07-30 NOTE — [facility] Suicide Risk Assessment (Signed)
 [facility] Discharge Suicide Risk Assessment   Principal Problem: Schizophrenia [facility]) Discharge Diagnoses: Principal Problem:   Schizophrenia (HCC)   Total Time spent with patient: 45 minutes   Demographic Factors:  Low socioeconomic status, Living alone, and Unemployed  Loss Factors: NA  Historical Factors: Prior suicide attempts and Impulsivity  Risk Reduction Factors:   Positive social support and Positive therapeutic relationship  Continued Clinical Symptoms:  Schizophrenia  Cognitive Features That Contribute To Risk:  Loss of executive function    Suicide Risk:  Mild:  Suicidal ideation of limited frequency, intensity, duration, and specificity.  There are no identifiable plans, no associated intent, mild dysphoria and related symptoms, good self-control (both objective and subjective assessment), few other risk factors, and identifiable protective factors, including available and accessible social support.   Follow-up Information     Services, [facility]. Schedule an appointment as soon as possible for a visit.   Why: You have a hospital follow up appointment for therapy and medication management services on                   .  This appointment will be held in person.  * Please bring photo ID, Insurance cards to this appt. Contact information: [address] [place] [address] [phone]                 Plan Of Care/Follow-up recommendations:  Activity:  As tolerated Diet:  Low sodium  Oliva DELENA Salmon, DO 07/30/2024, 9:33 AM

## 2024-07-30 NOTE — Progress Notes (Signed)
 Doyce Beth, (daughter) [phone]  [facility] Adult Case Management Discharge Plan :  Will you be returning to the same living situation after discharge:  Yes,  patient will be returning home. At discharge, do you have transportation home?: Yes,  SW to assist with transport by taxi. Do you have the ability to pay for your medications: Yes,  patient has insurance.  Release of information consent forms completed and in the chart;  Patient's signature needed at discharge.  Patient to Follow up at:  Follow-up Information     Services, [facility]. Schedule an appointment as soon as possible for a visit.   Why: You have a hospital follow up appointment for therapy and medication management services on                   .  This appointment will be held in person.  * Please bring photo ID, Insurance cards to this appt. Contact information: [address] [place] [address] [phone]                 Next level of care provider has access to [facility] Link:no  Safety Planning and Suicide Prevention discussed: Yes,   SPE completed with Doyce Beth, (daughter) [phone].   Has patient been referred to the Quitline?: Yes, referral sent 07/30/2024.  Patient has been referred for addiction treatment: Patient refused referral for treatment.  Hunter JONELLE Lever, LCSWA 07/30/2024, 4:34 PM

## 2024-07-30 NOTE — Progress Notes (Signed)
   07/30/24 0750  Psych Admission Type (Psych Patients Only)  Admission Status Voluntary  Psychosocial Assessment  Patient Complaints None  Eye Contact Fair  Facial Expression Animated  Affect Appropriate to circumstance  Speech Logical/coherent  Interaction Assertive  Motor Activity Other (Comment) (WNL for pt)  Appearance/Hygiene Unremarkable  Behavior Characteristics Appropriate to situation  Mood Pleasant  Thought Process  Coherency WDL  Content WDL  Delusions None reported or observed  Perception WDL  Hallucination None reported or observed  Judgment Impaired  Confusion None  Danger to Self  Current suicidal ideation? Denies  Danger to Others  Danger to Others None reported or observed

## 2024-07-30 NOTE — Progress Notes (Signed)
(  Sleep Hours) - 5.5 hours (Any PRNs that were needed, meds refused, or side effects to meds)- Ambien  5 mg (Any disturbances and when (visitation, over night)- None (Concerns raised by the patient)-  None (SI/HI/AVH)-  Denies

## 2024-07-30 NOTE — [facility] Suicide Risk Assessment (Signed)
 [facility] INPATIENT:  Family/Significant Other Suicide Prevention Education  Suicide Prevention Education:  Education Completed; Rachel Ray, (daughter) [phone] has been identified by the patient as the family member with whom the patient will be residing, and identified as the person(s) who will aid the patient in the event of a mental health crisis (suicidal ideations/suicide attempt).  With written consent from the patient, the family member/significant other has been provided the following suicide prevention education, prior to the and/or following the discharge of the patient.  The suicide prevention education provided includes the following: Suicide risk factors Suicide prevention and interventions National Suicide Hotline telephone number [facility] assessment telephone number [facility] Emergency Assistance 911 [facility] and/or Residential Mobile Crisis Unit telephone number  Request made of family/significant other to: Remove weapons (e.g., guns, rifles, knives), all items previously/currently identified as safety concern.   Remove drugs/medications (over-the-counter, prescriptions, illicit drugs), all items previously/currently identified as a safety concern.  The family member verbalizes understanding of the suicide prevention education information provided.  The family member/significant other agrees to remove the items of safety concern listed above.  Rachel Ray R Alto 07/30/2024, 10:10 AM

## 2024-07-30 NOTE — Group Note (Signed)
 [facility] LCSW Group Therapy Note  Date/Time:  @TD @ 10:00-11:00  Type of Therapy and Topic:  Group Therapy:  Personal Bill of Rights  Participation Level:  Did Not Attend   Description of Group This process group involved a discussion with and between patients about Understanding self.   The difference between healthy and unhealthy coping skills was described, then examples were elicited from group members for their Personal bill of Rights.  This then was followed by a discussion about boundaries, respects and what the patient wants, what it is, how important it is, and why we choose the coping techniques we choose.  Participants were encouraged to think about knowing what they want as necessary and positive.  Therapeutic Goals Patient will identify and describe what their boundary consists of Patient will participate in generating ideas when they use their coping skills to address their boundary  Patients will be supportive of one another and receive support from others Patients will understand the need all humans have to   Summary of Patient Progress:  The patient expressed NA   Therapeutic Modalities Brief Solution-Focused Therapy Psychoeducation

## 2024-07-30 NOTE — Group Note (Signed)
 Date:  07/30/2024 Time:  10:01 AM  Group Topic/Focus:  Coping Mechanisms This group focused on coping mechanisms and understanding the importance of developing healthier ways to manage stress, emotional distress, and mental health challenges. The group explored healthy vs. nonhealthy coping mechanisms, emphasizing how some behaviors like substance abuse or avoidance can worsen mental health, while others like adequate sleep and food and relaxation techniques can lead to better emotional regulation and overall well-being.  A worksheet was provided to participants and highlighted adaptive vs. maladaptive coping mechanisms. Participants were encouraged to share their personal experiences and engage in conversation focused on resilience, improving self-awareness, and practicing deep breathing as a positive coping mechanism.    Participation Level:  Active  Participation Quality:  Appropriate and Attentive  Affect:  Appropriate  Cognitive:  Alert and Appropriate  Insight: Appropriate and Improving  Engagement in Group:  Engaged and Improving  Modes of Intervention:  Discussion, Education, and Problem-solving  Additional Comments:  Pt attended and participated in this group.  Kristi HERO Ivelisse Culverhouse 07/30/2024, 10:01 AM

## 2024-07-30 NOTE — Discharge Summary (Addendum)
 Physician Discharge Summary Note  Patient:  Rachel Ray is an 43 y.o., female MRN:  [id number] DOB:  May 06, 1981 Patient phone:  [phone] (home)  Patient address:   [address] [place] [address],  Total Time spent with patient: 45 minutes  Date of Admission:  07/27/2024 Date of Discharge: 07/30/2024  Reason for Admission:  Rachel Ray is a 43 year old African-American female with prior psychiatric history significant for schizophrenia, schizoaffective disorder bipolar type, and auditory hallucinations, who presents voluntarily to [facility] from [facility] for complaint of worsening anxiety, poor sleep x 3 days, and auditory hallucinations in the context of not taking her psychotropic medication for 2 to 3 days.  BAL less than 15, UDS positive for benzodiazepine.   During this assessment, patient reports she was last admitted at [facility] in September 2024 with complaint of auditory/tactile hallucinations due to stopping her psychotropic medications.  She reports 4 prior suicide attempts, last self-mutilation 2017, reports history of self-injurious behavior in her early 71s and approximately 10 prior psychiatric hospitalizations, most recent at [facility] in August 2024.  Report being followed by a therapist, psychiatrist, at the [facility].  Denies drug use, however, last drug use of cocaine 1 years ago, denies alcohol use however, takes alcohol occasionally.  Endorses vaping nicotine  daily. Reports she lives with her 39 year old son in her home.    Patient reports symptoms of depression to include loneliness in terms of not having a stable man in her life, sadness, losing interest in what she used to like, worthlessness, hopelessness, insomnia, poor appetite.  Reports symptoms of anxiety as worrying too much, tremors, agitated, and not being good enough.  Psychotic symptoms include paranoia due to being iffy about people, not trusting  friends due to betrayal, auditory hallucination of people just talking in her head.  Denies ideas of reference, visual hallucination, and delusion.  Denies symptoms of mania, however, added I used to be manic when I was on drugs especially when using cocaine.  She denies symptoms of PTSD or OCD.  Principal Problem: Schizophrenia [facility]) Discharge Diagnoses: Schizophrenia, paranoid type.   Past Psychiatric History: Previous Psych Diagnoses: Schizophrenia, depression and anxiety. Prior inpatient treatment: X 5 Current/prior outpatient treatment: None currently. Prior rehab hx: Denies Psychotherapy hx: Yes History of suicide: Denies history of suicide attempts. History of homicide or aggression: Denies Psychiatric medication history: Patient has been on trial of Lexapro , Seroquel , trazodone , and hydroxyzine  Psychiatric medication compliance history: Noncompliance Neuromodulation history: Denies Current Psychiatrist: Yes, [facility] Current therapist: Yes, therapist [facility]  Past Medical History:  Past Medical History:  Diagnosis Date   Anemia    Anxiety    Arthritis    rheumatoid   Chronic pelvic pain in female    Depression    Dysmenorrhea    Hypertension    Menorrhagia    Schizophrenia (HCC)    Uterine fibroid     Past Surgical History:  Procedure Laterality Date   ABDOMINAL HYSTERECTOMY N/A 03/25/2016   Procedure: HYSTERECTOMY ABDOMINAL;  Surgeon: Vonn VEAR Inch, MD;  Location: [facility];  Service: Gynecology;  Laterality: N/A;   ADENOIDECTOMY     CESAREAN SECTION     x 3   CHOLECYSTECTOMY     SALPINGOOPHORECTOMY Bilateral 03/25/2016   Procedure: SALPINGO OOPHORECTOMY;  Surgeon: Vonn VEAR Inch, MD;  Location: [facility];  Service: Gynecology;  Laterality: Bilateral;   TONSILLECTOMY  TUBAL LIGATION      Hospital Course:  The patent was admitted voluntarily to inpatient psychiatry for anxiety, insomnia, and auditory  hallucinations in the context of not taking her medication multiple days prior to admission. She was restarted on her home regimen, which consisted of quetiapine  100/400 mg BID, gabapentin  100 mg TID, hydrochlorothiazide  25 mg daily, and clonidine  0.2 mg at bedtime. She tolerated these well and her symptoms rapidly improved and resolved. She initially was endorsing paranoia and feeling that she was in a spiritual battle, however by HD 3 these symptoms began resolving and on the day of discharge she denied hallucinations, was not endorsing any significant paranoia, reported a euthymic mood, and slept well the night prior. She denied suicidal ideation and stated that she felt comfortable returning home. She was provided with resources for continuing therapy at [facility] and additional resources in the event she wished to establish with a new provider. During this admission she largely remained isolated to her room, but she otherwise demonstrated safe and appropriate behaviors throughout. No significant medical issues presented or were addressed during this admission.    Musculoskeletal: Normal gait and station   Physical Exam Vitals and nursing note reviewed.  Constitutional:      Appearance: Normal appearance.  HENT:     Head: Normocephalic and atraumatic.  Pulmonary:     Effort: Pulmonary effort is normal.  Neurological:     Mental Status: She is alert.    Review of Systems  Constitutional: Negative.   Respiratory: Negative.    Cardiovascular: Negative.    Blood pressure 116/82, pulse 64, temperature 97.6 F (36.4 C), temperature source Oral, resp. rate 16, height 5' 2 (1.575 m), weight 125.6 kg, last menstrual period 03/13/2016, SpO2 100%. Body mass index is 50.66 kg/m.   Social History   Tobacco Use  Smoking Status Former   Current packs/day: 0.00   Average packs/day: 0.5 packs/day for 15.0 years (7.5 ttl pk-yrs)   Types: Cigarettes   Start date: 01/21/2005   Quit date: 01/22/2020    Years since quitting: 4.5  Smokeless Tobacco Never   Tobacco Cessation:  N/A, patient does not currently use tobacco products   Blood Alcohol level:  Lab Results  Component Value Date   [facility] <15 07/24/2024   ETH <10 08/17/2023    Metabolic Disorder Labs:  Lab Results  Component Value Date   HGBA1C 5.9 (H) 08/20/2023   MPG 122.63 08/20/2023   No results found for: PROLACTIN Lab Results  Component Value Date   CHOL 259 (H) 07/28/2024   TRIG 93 07/28/2024   HDL 58 07/28/2024   CHOLHDL 4.5 07/28/2024   VLDL 19 07/28/2024   LDLCALC 182 (H) 07/28/2024   LDLCALC 130 (H) 08/20/2023    See Psychiatric Specialty Exam and Suicide Risk Assessment completed by Attending Physician prior to discharge.  Discharge destination:  Home  Is patient on multiple antipsychotic therapies at discharge:  No      Allergies as of 07/30/2024       Reactions   Shellfish Allergy Anaphylaxis, Shortness Of Breath, Swelling   Penicillins Itching        Medication List     TAKE these medications      Indication  Buprenorphine HCl-Naloxone HCl 2-0.5 MG Film Place 1 Film under the tongue daily.  Indication: Opioid Dependence   cloNIDine  0.2 MG tablet Commonly known as: CATAPRES  Take 1 tablet (0.2 mg total) by mouth at bedtime.  Indication: High Blood Pressure  gabapentin  100 MG capsule Commonly known as: NEURONTIN  Take 1 capsule (100 mg total) by mouth 3 (three) times daily.  Indication: Generalized Anxiety Disorder   hydrochlorothiazide  25 MG tablet Commonly known as: HYDRODIURIL  Take 1 tablet (25 mg total) by mouth daily. Start taking on: July 31, 2024  Indication: High Blood Pressure   hydrOXYzine  25 MG tablet Commonly known as: ATARAX  Take 1 tablet (25 mg total) by mouth 3 (three) times daily as needed for anxiety.  Indication: Feeling Anxious, Feeling Tense   melatonin 3 MG Tabs tablet Take 1 tablet (3 mg total) by mouth at bedtime as needed.  Indication:  Trouble Sleeping   metFORMIN  500 MG 24 hr tablet Commonly known as: GLUCOPHAGE -XR Take 2 tablets (1,000 mg total) by mouth daily with breakfast. What changed: how much to take  Indication: Body Weight Gain due to Antipsychotic Medication Use   potassium chloride  SA 20 MEQ tablet Commonly known as: KLOR-CON  M Take 1 tablet (20 mEq total) by mouth daily.  Indication: Low Amount of Potassium in the Blood   QUEtiapine  100 MG tablet Commonly known as: SEROQUEL  Take 1 tablet (100 mg total) by mouth in the morning.  Indication: Manic Phase of Manic-Depression   QUEtiapine  400 MG tablet Commonly known as: SEROQUEL  Take 1 tablet (400 mg total) by mouth at bedtime.  Indication: Manic Phase of Manic-Depression        Follow-up Information     Services, [facility]. Schedule an appointment as soon as possible for a visit.   Why: You have a hospital follow up appointment for therapy and medication management services on                   .  This appointment will be held in person.  * Please bring photo ID, Insurance cards to this appt. Contact information: [address] [place] [address] [phone]                 Follow-up recommendations:  Activity:  As tolerated Diet:  low sodium   Signed: Oliva DELENA Salmon, DO 07/30/2024, 9:37 AM

## 2024-07-30 NOTE — Plan of Care (Signed)
  Problem: Education: Goal: Knowledge of [facility] Education information/materials will improve Outcome: Completed/Met Goal: Emotional status will improve Outcome: Completed/Met Goal: Mental status will improve Outcome: Completed/Met Goal: Verbalization of understanding the information provided will improve Outcome: Completed/Met   Problem: Activity: Goal: Interest or engagement in activities will improve Outcome: Completed/Met Goal: Sleeping patterns will improve Outcome: Completed/Met   Problem: Coping: Goal: Ability to verbalize frustrations and anger appropriately will improve Outcome: Completed/Met Goal: Ability to demonstrate self-control will improve Outcome: Completed/Met   Problem: Health Behavior/Discharge Planning: Goal: Identification of resources available to assist in meeting health care needs will improve Outcome: Completed/Met Goal: Compliance with treatment plan for underlying cause of condition will improve Outcome: Completed/Met   Problem: Physical Regulation: Goal: Ability to maintain clinical measurements within normal limits will improve Outcome: Completed/Met

## 2024-07-30 NOTE — Plan of Care (Signed)
   Problem: Education: Goal: Emotional status will improve Outcome: Progressing   Problem: Activity: Goal: Sleeping patterns will improve Outcome: Progressing

## 2024-07-30 NOTE — Progress Notes (Signed)
 Patient ID: Rachel Ray, female   DOB: November 03, 1981, 43 y.o.   MRN: [id number] Discharge instructions, follow up appointments and medications reviewed, pt verbalized understanding. Belongings returned, pt dc home via taxi.

## 2024-07-31 NOTE — Care Management Note (Signed)
 This patient was discharged over the weekend, although there was no mention that she would leave before Monday in Friday progression meeting.  An actual appointment was not made but information was provided for [facility] in [place].

## 2024-09-25 ENCOUNTER — Encounter: Payer: Self-pay | Admitting: Radiology

## 2024-10-10 ENCOUNTER — Other Ambulatory Visit: Payer: Self-pay

## 2024-10-10 ENCOUNTER — Encounter (HOSPITAL_COMMUNITY): Payer: Self-pay | Admitting: *Deleted

## 2024-10-10 ENCOUNTER — Emergency Department (HOSPITAL_COMMUNITY)
Admission: EM | Admit: 2024-10-10 | Discharge: 2024-10-10 | Disposition: A | Discharge status: Home / Self Care | Attending: Emergency Medicine | Admitting: Emergency Medicine

## 2024-10-10 DIAGNOSIS — R600 Localized edema: Secondary | ICD-10-CM | POA: Insufficient documentation

## 2024-10-10 DIAGNOSIS — R2242 Localized swelling, mass and lump, left lower limb: Secondary | ICD-10-CM | POA: Diagnosis present

## 2024-10-10 LAB — BASIC METABOLIC PANEL WITH GFR
Anion gap: 7 (ref 5–15)
BUN: 8 mg/dL (ref 6–20)
CO2: 30 mmol/L (ref 22–32)
Calcium: 9.1 mg/dL (ref 8.9–10.3)
Chloride: 102 mmol/L (ref 98–111)
Creatinine, Ser: 0.61 mg/dL (ref 0.44–1.00)
GFR, Estimated: >60 mL/min (ref >60)
Glucose, Bld: 107 mg/dL — ABNORMAL HIGH (ref 70–99)
Potassium: 3.7 mmol/L (ref 3.5–5.1)
Sodium: 139 mmol/L (ref 135–145)

## 2024-10-10 MED ORDER — HYDROCHLOROTHIAZIDE 25 MG PO TABS: 25.0000 mg | ORAL_TABLET | Freq: Every day | ORAL | 1 refills | Status: AC

## 2024-10-10 MED ORDER — HYDROCHLOROTHIAZIDE 25 MG PO TABS
25.0000 mg | ORAL_TABLET | Freq: Every day | ORAL | Status: DC
  Administered 2024-10-10: 25 mg via ORAL
  Filled 2024-10-10: qty 1

## 2024-10-10 NOTE — ED Provider Notes (Signed)
 [place] EMERGENCY DEPARTMENT AT [facility] Provider Note   CSN: [id number] Arrival date & time: 10/10/24  1436     Patient presents with: No chief complaint on file.   Rachel Ray is a 43 y.o. female.   Patient to ED with multiple complaints. She complains of left foot swelling up to the knee that she first noticed this morning. No injury or wound. She reports episodes of dizziness like the surroundings are spinning that occur at random without relationship to position. They are brief in duration. No headache or visual changes. She reports she has brief episodes of nausea without vomiting that are unrelated to her dizzy spells. No chest pain, SOB, fever, cough. She reports she has no PCP as hers retired last year. She has been out of her hydrochlorothiazide  for the past 2 months.   The history is provided by the patient. No language interpreter was used.       Prior to Admission medications   Medication Sig Start Date End Date Taking? Authorizing Provider  Buprenorphine HCl-Naloxone HCl 2-0.5 MG FILM Place 1 Film under the tongue daily. 07/07/24   [provider]  cloNIDine  (CATAPRES ) 0.2 MG tablet Take 1 tablet (0.2 mg total) by mouth at bedtime. 07/30/24   Prentis Kitchens A, DO  gabapentin  (NEURONTIN ) 100 MG capsule Take 1 capsule (100 mg total) by mouth 3 (three) times daily. 08/23/23 07/24/24  Massengill, Rankin, MD  hydrochlorothiazide  (HYDRODIURIL ) 25 MG tablet Take 1 tablet (25 mg total) by mouth daily. 10/10/24   Odell Balls, PA-C  hydrOXYzine  (ATARAX ) 25 MG tablet Take 1 tablet (25 mg total) by mouth 3 (three) times daily as needed for anxiety. 07/30/24   Bouchard, Marc A, DO  melatonin 3 MG TABS tablet Take 1 tablet (3 mg total) by mouth at bedtime as needed. 08/23/23   Massengill, Rankin, MD  metFORMIN  (GLUCOPHAGE -XR) 500 MG 24 hr tablet Take 2 tablets (1,000 mg total) by mouth daily with breakfast. 07/30/24 08/29/24  Prentis Kitchens LABOR, DO  potassium chloride   SA (KLOR-CON  M) 20 MEQ tablet Take 1 tablet (20 mEq total) by mouth daily. 07/27/24   Shahmehdi, Adriana LABOR, MD  QUEtiapine  (SEROQUEL ) 100 MG tablet Take 1 tablet (100 mg total) by mouth in the morning. 07/30/24 08/29/24  Prentis Kitchens A, DO  QUEtiapine  (SEROQUEL ) 400 MG tablet Take 1 tablet (400 mg total) by mouth at bedtime. 07/30/24 08/29/24  Prentis Kitchens LABOR, DO    Allergies: Shellfish allergy and Penicillins    Review of Systems  Updated Vital Signs BP (!) 152/97 (BP Location: Right Arm)   Pulse 64   Temp 98.6 F (37 C)   Resp 18   Ht 5' 2 (1.575 m)   Wt 125.6 kg   LMP 03/13/2016 (Exact Date)   SpO2 98%   BMI 50.65 kg/m   Physical Exam Vitals and nursing note reviewed.  Constitutional:      Appearance: Normal appearance. She is well-developed.  HENT:     Head: Normocephalic.  Cardiovascular:     Rate and Rhythm: Normal rate and regular rhythm.     Pulses: Normal pulses.     Heart sounds: No murmur heard. Pulmonary:     Effort: Pulmonary effort is normal.     Breath sounds: Normal breath sounds. No wheezing, rhonchi or rales.  Abdominal:     General: Bowel sounds are normal.     Palpations: Abdomen is soft.     Tenderness: There is no abdominal tenderness.  There is no guarding or rebound.  Musculoskeletal:        General: Normal range of motion.     Cervical back: Normal range of motion and neck supple.     Right lower leg: Edema present.     Left lower leg: Edema present.     Comments: There is pitting edema to bilateral LE's, left is slightly worse than the right. No calf tenderness. No redness or warmth.   Skin:    General: Skin is warm and dry.  Neurological:     General: No focal deficit present.     Mental Status: She is alert and oriented to person, place, and time.     GCS: GCS eye subscore is 4. GCS verbal subscore is 5. GCS motor subscore is 6.     Cranial Nerves: No cranial nerve deficit.     Sensory: Sensation is intact.     Motor: Motor function is intact.      Coordination: Coordination is intact.     (all labs ordered are listed, but only abnormal results are displayed) Labs Reviewed  BASIC METABOLIC PANEL WITH GFR - Abnormal; Notable for the following components:      Result Value   Glucose, Bld 107 (*)    All other components within normal limits    EKG: None  Radiology: No results found.   Procedures   Medications Ordered in the ED - No data to display  Clinical Course as of 10/10/24 1759  Tue Oct 10, 2024  1651 Patient with multiple complaints as detailed in the HPI. No dizziness now. Normal neuro exam. VSS. She reports LE edema and is found to have pitting. No SOB, CP. She is out of her hydrochlorothiazide . Will check electrolytes and anticipate discharge home with Rx for her regular medication.  [SU]  1759 Electrolytes are WNL. VSS, mild HTN. Will restart hydrochlorothiazide  and encourage follow up with PCP.  [SU]    Clinical Course User Index [SU] Odell Balls, PA-C                                 Medical Decision Making Amount and/or Complexity of Data Reviewed Labs: ordered.  Risk Prescription drug management.        Final diagnoses:  Peripheral edema    ED Discharge Orders          Ordered    hydrochlorothiazide  (HYDRODIURIL ) 25 MG tablet  Daily        10/10/24 1758               Odell Balls, PA-C 10/10/24 1759    Suzette Pac, MD 10/12/24 1301

## 2024-10-10 NOTE — ED Triage Notes (Signed)
 Pt with left foot pain and swelling noted this morning, pt denies any injury to foot. Pt states she ran out of her med hydrochlorothiazide  x 1-2 months-her PCP retired. Pt also c/o joint pain in bilateral shoulders and left knee pain. +dizziness off and on.

## 2024-10-10 NOTE — Discharge Instructions (Signed)
 Restart your hydrochlorothiazide  and schedule an appointment with a primary care provider of your choice for further evaluation and routine care.   Return to the ED as needed.

## 2024-12-02 ENCOUNTER — Emergency Department (HOSPITAL_COMMUNITY)

## 2024-12-02 ENCOUNTER — Other Ambulatory Visit: Payer: Self-pay

## 2024-12-02 ENCOUNTER — Encounter (HOSPITAL_COMMUNITY): Payer: Self-pay

## 2024-12-02 ENCOUNTER — Emergency Department (HOSPITAL_COMMUNITY)
Admission: EM | Admit: 2024-12-02 | Discharge: 2024-12-02 | Disposition: A | Discharge status: Home / Self Care | Attending: Emergency Medicine | Admitting: Emergency Medicine

## 2024-12-02 DIAGNOSIS — E876 Hypokalemia: Secondary | ICD-10-CM | POA: Insufficient documentation

## 2024-12-02 DIAGNOSIS — R112 Nausea with vomiting, unspecified: Secondary | ICD-10-CM | POA: Insufficient documentation

## 2024-12-02 DIAGNOSIS — R1013 Epigastric pain: Secondary | ICD-10-CM | POA: Insufficient documentation

## 2024-12-02 LAB — URINALYSIS, W/ REFLEX TO CULTURE (INFECTION SUSPECTED)
Bilirubin Urine: NEGATIVE
Glucose, UA: NEGATIVE mg/dL
Hgb urine dipstick: NEGATIVE
Ketones, ur: NEGATIVE mg/dL
Leukocytes,Ua: NEGATIVE
Nitrite: NEGATIVE
Protein, ur: 30 mg/dL — AB
Specific Gravity, Urine: 1.02 (ref 1.005–1.030)
pH: 5 (ref 5.0–8.0)

## 2024-12-02 LAB — CBC WITH DIFFERENTIAL/PLATELET
Abs Immature Granulocytes: 0.03 K/uL (ref 0.00–0.07)
Basophils Absolute: 0 K/uL (ref 0.0–0.1)
Basophils Relative: 0 %
Eosinophils Absolute: 0.1 K/uL (ref 0.0–0.5)
Eosinophils Relative: 2 %
HCT: 38.4 % (ref 36.0–46.0)
Hemoglobin: 12.8 g/dL (ref 12.0–15.0)
Immature Granulocytes: 0 %
Lymphocytes Relative: 28 %
Lymphs Abs: 1.9 K/uL (ref 0.7–4.0)
MCH: 30.8 pg (ref 26.0–34.0)
MCHC: 33.3 g/dL (ref 30.0–36.0)
MCV: 92.3 fL (ref 80.0–100.0)
Monocytes Absolute: 0.4 K/uL (ref 0.1–1.0)
Monocytes Relative: 5 %
Neutro Abs: 4.5 K/uL (ref 1.7–7.7)
Neutrophils Relative %: 65 %
Platelets: 199 K/uL (ref 150–400)
RBC: 4.16 MIL/uL (ref 3.87–5.11)
RDW: 12.5 % (ref 11.5–15.5)
WBC: 7 K/uL (ref 4.0–10.5)
nRBC: 0 % (ref 0.0–0.2)

## 2024-12-02 LAB — HCG, QUANTITATIVE, PREGNANCY: hCG, Beta Chain, Quant, S: <1 m[IU]/mL

## 2024-12-02 LAB — COMPREHENSIVE METABOLIC PANEL WITH GFR
ALT: 16 U/L (ref 0–44)
AST: 24 U/L (ref 15–41)
Albumin: 4 g/dL (ref 3.5–5.0)
Alkaline Phosphatase: 64 U/L (ref 38–126)
Anion gap: 7 (ref 5–15)
BUN: 8 mg/dL (ref 6–20)
CO2: 30 mmol/L (ref 22–32)
Calcium: 9.2 mg/dL (ref 8.9–10.3)
Chloride: 104 mmol/L (ref 98–111)
Creatinine, Ser: 0.62 mg/dL (ref 0.44–1.00)
GFR, Estimated: >60 mL/min
Glucose, Bld: 97 mg/dL (ref 70–99)
Potassium: 3.4 mmol/L — ABNORMAL LOW (ref 3.5–5.1)
Sodium: 141 mmol/L (ref 135–145)
Total Bilirubin: 0.4 mg/dL (ref 0.0–1.2)
Total Protein: 7.2 g/dL (ref 6.5–8.1)

## 2024-12-02 LAB — LIPASE, BLOOD: Lipase: 14 U/L (ref 11–51)

## 2024-12-02 MED ORDER — FAMOTIDINE 20 MG PO TABS: 20.0000 mg | ORAL_TABLET | Freq: Two times a day (BID) | ORAL | 0 refills | Status: AC

## 2024-12-02 MED ORDER — FENTANYL CITRATE (PF) 100 MCG/2ML IJ SOLN: 100.0000 ug | INTRAMUSCULAR | Status: DC | PRN

## 2024-12-02 MED ORDER — PANTOPRAZOLE SODIUM 40 MG IV SOLR
40.0000 mg | Freq: Once | INTRAVENOUS | Status: AC
  Administered 2024-12-02: 40 mg via INTRAVENOUS
  Filled 2024-12-02: qty 10

## 2024-12-02 MED ORDER — LACTATED RINGERS IV BOLUS
1000.0000 mL | Freq: Once | INTRAVENOUS | Status: AC
  Administered 2024-12-02: 1000 mL via INTRAVENOUS

## 2024-12-02 MED ORDER — PANTOPRAZOLE SODIUM 20 MG PO TBEC: 20.0000 mg | DELAYED_RELEASE_TABLET | Freq: Two times a day (BID) | ORAL | 0 refills | Status: AC

## 2024-12-02 MED ORDER — IOHEXOL 300 MG/ML  SOLN
100.0000 mL | Freq: Once | INTRAMUSCULAR | Status: AC | PRN
  Administered 2024-12-02: 100 mL via INTRAVENOUS

## 2024-12-02 MED ORDER — ONDANSETRON HCL 4 MG/2ML IJ SOLN
4.0000 mg | Freq: Once | INTRAMUSCULAR | Status: AC
  Administered 2024-12-02: 4 mg via INTRAVENOUS
  Filled 2024-12-02: qty 2

## 2024-12-02 MED ORDER — FAMOTIDINE IN NACL 20-0.9 MG/50ML-% IV SOLN
20.0000 mg | Freq: Once | INTRAVENOUS | Status: AC
  Administered 2024-12-02: 20 mg via INTRAVENOUS
  Filled 2024-12-02: qty 50

## 2024-12-02 MED ORDER — ONDANSETRON 4 MG PO TBDP: 4.0000 mg | ORAL_TABLET | Freq: Three times a day (TID) | ORAL | 0 refills | Status: AC | PRN

## 2024-12-02 NOTE — ED Triage Notes (Signed)
 Pt bib EMS from home. Pt c/o mid/upper abdominal pain starting yesterday with no relief after medication. Pt reports that abd pain woke her up at 0230 with one emesis occurrence. Reports not eating at all yesterday.

## 2024-12-02 NOTE — ED Provider Notes (Signed)
 " [place] EMERGENCY DEPARTMENT AT [facility] Provider Note   CSN: [id number] Arrival date & time: 12/02/24  0354     Patient presents with: Nausea and Emesis   Rachel Ray is a 44 y.o. female.   44 yo M here with epigastric pain over last couple days with a couple episodes of emesis. No fever. H/o biliary issues. No known sick contacts. No diarrhea. No suspicious food intake.    Emesis      Prior to Admission medications  Medication Sig Start Date End Date Taking? Authorizing Provider  famotidine  (PEPCID ) 20 MG tablet Take 1 tablet (20 mg total) by mouth 2 (two) times daily. 12/02/24 12/12/24 Yes Tzippy Testerman, Selinda, MD  ondansetron  (ZOFRAN -ODT) 4 MG disintegrating tablet Take 1 tablet (4 mg total) by mouth every 8 (eight) hours as needed for vomiting. 12/02/24  Yes Beaumont Austad, Selinda, MD  pantoprazole  (PROTONIX ) 20 MG tablet Take 1 tablet (20 mg total) by mouth 2 (two) times daily before a meal. 12/02/24 12/12/24 Yes Aleea Hendry, Selinda, MD  Buprenorphine HCl-Naloxone HCl 2-0.5 MG FILM Place 1 Film under the tongue daily. 07/07/24   [provider]  cloNIDine  (CATAPRES ) 0.2 MG tablet Take 1 tablet (0.2 mg total) by mouth at bedtime. 07/30/24   Prentis Kitchens A, DO  gabapentin  (NEURONTIN ) 100 MG capsule Take 1 capsule (100 mg total) by mouth 3 (three) times daily. 08/23/23 07/24/24  Massengill, Rankin, MD  hydrochlorothiazide  (HYDRODIURIL ) 25 MG tablet Take 1 tablet (25 mg total) by mouth daily. 10/10/24   Odell Balls, PA-C  hydrOXYzine  (ATARAX ) 25 MG tablet Take 1 tablet (25 mg total) by mouth 3 (three) times daily as needed for anxiety. 07/30/24   Bouchard, Marc A, DO  melatonin 3 MG TABS tablet Take 1 tablet (3 mg total) by mouth at bedtime as needed. 08/23/23   Massengill, Rankin, MD  metFORMIN  (GLUCOPHAGE -XR) 500 MG 24 hr tablet Take 2 tablets (1,000 mg total) by mouth daily with breakfast. 07/30/24 08/29/24  Prentis Kitchens A, DO  potassium chloride  SA (KLOR-CON  M) 20 MEQ tablet Take  1 tablet (20 mEq total) by mouth daily. 07/27/24   Shahmehdi, Adriana LABOR, MD  QUEtiapine  (SEROQUEL ) 100 MG tablet Take 1 tablet (100 mg total) by mouth in the morning. 07/30/24 08/29/24  Prentis Kitchens A, DO  QUEtiapine  (SEROQUEL ) 400 MG tablet Take 1 tablet (400 mg total) by mouth at bedtime. 07/30/24 08/29/24  Prentis Kitchens LABOR, DO    Allergies: Shellfish allergy and Penicillins    Review of Systems  Gastrointestinal:  Positive for vomiting.    Updated Vital Signs BP 110/70   Pulse 65   Temp 97.9 F (36.6 C) (Oral)   Resp 16   Ht 5' 2 (1.575 m)   Wt 124.7 kg   LMP 03/13/2016   SpO2 96%   BMI 50.30 kg/m   Physical Exam Vitals and nursing note reviewed.  Constitutional:      Appearance: She is well-developed.  HENT:     Head: Normocephalic and atraumatic.  Cardiovascular:     Rate and Rhythm: Normal rate and regular rhythm.  Pulmonary:     Effort: No respiratory distress.     Breath sounds: No stridor.  Abdominal:     General: There is no distension.     Tenderness: There is abdominal tenderness (epigastric).  Musculoskeletal:     Cervical back: Normal range of motion.  Neurological:     Mental Status: She is alert.     (all labs  ordered are listed, but only abnormal results are displayed) Labs Reviewed  COMPREHENSIVE METABOLIC PANEL WITH GFR - Abnormal; Notable for the following components:      Result Value   Potassium 3.4 (*)    All other components within normal limits  URINALYSIS, W/ REFLEX TO CULTURE (INFECTION SUSPECTED) - Abnormal; Notable for the following components:   APPearance CLOUDY (*)    Protein, ur 30 (*)    Bacteria, UA MANY (*)    All other components within normal limits  CBC WITH DIFFERENTIAL/PLATELET  LIPASE, BLOOD  HCG, QUANTITATIVE, PREGNANCY    EKG: None  Radiology: CT ABDOMEN PELVIS W CONTRAST Result Date: 12/02/2024 EXAM: CT ABDOMEN AND PELVIS WITH CONTRAST 12/02/2024 06:37:08 AM TECHNIQUE: CT of the abdomen and pelvis was performed  with the administration of 100 mL of iohexol  (OMNIPAQUE ) 300 MG/ML solution. Multiplanar reformatted images are provided for review. Automated exposure control, iterative reconstruction, and/or weight-based adjustment of the mA/kV was utilized to reduce the radiation dose to as low as reasonably achievable. COMPARISON: CT abdomen and pelvis 06/18/2016. CLINICAL HISTORY: 44 year old female with onset of abdominal pain yesterday, and woke at 0230 hours with vomiting. FINDINGS: LOWER CHEST: Lung bases are stable and negative aside from minor atelectasis. LIVER: The liver is unremarkable. Early portal venous contrast timing, not well evaluated. GALLBLADDER AND BILE DUCTS: Chronic cholecystectomy. No biliary ductal dilatation. SPLEEN: No acute abnormality. PANCREAS: The pancreas appears mildly indistinct and lower in density (series 2 image 25) but there is no convincing inflammation in the lesser sac. No pancreatic ductal dilatation. ADRENAL GLANDS: No acute abnormality. KIDNEYS, URETERS AND BLADDER: No stones in the kidneys or ureters. No hydronephrosis. No perinephric or periureteral stranding. Decompressed urinary bladder. GI AND BOWEL: Stomach demonstrates no acute abnormality. Decompressed small bowel. Large bowel mild to moderate redundancy and retained stool throughout. Normal appendix posterior to the cecum on series 2 image 67. No large bowel inflammation. There is no bowel obstruction. PERITONEUM AND RETROPERITONEUM: No ascites. No free air. VASCULATURE: Major arterial structures in the abdomen and pelvis appear patent and normal. Aorta is normal in caliber. LYMPH NODES: No lymphadenopathy. REPRODUCTIVE ORGANS: Surgically absent uterus. Diminutive or absent ovaries. BONES AND SOFT TISSUES: No acute osseous abnormality. No focal soft tissue abnormality. Incidental numerous calcified pelvic phleboliths. IMPRESSION: 1. Mild pancreatitis is difficult to exclude. But No other acute or inflammatory process  identified in the abdomen or pelvis. Electronically signed by: Helayne Hurst MD MD 12/02/2024 06:47 AM EST RP Workstation: [id number]     Procedures   Medications Ordered in the ED  ondansetron  (ZOFRAN ) injection 4 mg (4 mg Intravenous Given 12/02/24 0435)  pantoprazole  (PROTONIX ) injection 40 mg (40 mg Intravenous Given 12/02/24 0435)  lactated ringers  bolus 1,000 mL (0 mLs Intravenous Stopped 12/02/24 0749)  famotidine  (PEPCID ) IVPB 20 mg premix (0 mg Intravenous Stopped 12/02/24 0509)  iohexol  (OMNIPAQUE ) 300 MG/ML solution 100 mL (100 mLs Intravenous Contrast Given 12/02/24 0617)                                    Medical Decision Making Amount and/or Complexity of Data Reviewed Labs: ordered. Radiology: ordered.  Risk Prescription drug management.   No urinary symptoms, doubt UTI without totally consistent UA. Symptoms more c/w PUD/gastritis/GERD. Symptoms improved with PPI/maalox here, not requiring opiates. Pain free for an hour or two. Ct negative. Suspect same, will continue meds at home and fu w/ PCP/GI  as needed.    Final diagnoses:  Nausea and vomiting, unspecified vomiting type  Epigastric pain    ED Discharge Orders          Ordered    famotidine  (PEPCID ) 20 MG tablet  2 times daily        12/02/24 0732    ondansetron  (ZOFRAN -ODT) 4 MG disintegrating tablet  Every 8 hours PRN        12/02/24 0732    pantoprazole  (PROTONIX ) 20 MG tablet  2 times daily before meals        12/02/24 0732               Fumi Guadron, Selinda, MD 12/02/24 2310  "
